# Patient Record
Sex: Female | Born: 1940
Health system: Southern US, Community
[De-identification: ages and names within clinical notes are randomized; demographics above are authoritative.]

## PROBLEM LIST (undated history)

## (undated) DIAGNOSIS — I4719 Other supraventricular tachycardia: Secondary | ICD-10-CM

## (undated) DIAGNOSIS — I471 Supraventricular tachycardia: Secondary | ICD-10-CM

## (undated) DIAGNOSIS — C801 Malignant (primary) neoplasm, unspecified: Secondary | ICD-10-CM

## (undated) DIAGNOSIS — Z8669 Personal history of other diseases of the nervous system and sense organs: Secondary | ICD-10-CM

## (undated) DIAGNOSIS — R55 Syncope and collapse: Secondary | ICD-10-CM

## (undated) DIAGNOSIS — K589 Irritable bowel syndrome without diarrhea: Secondary | ICD-10-CM

## (undated) DIAGNOSIS — Z5189 Encounter for other specified aftercare: Secondary | ICD-10-CM

## (undated) DIAGNOSIS — D126 Benign neoplasm of colon, unspecified: Secondary | ICD-10-CM

## (undated) DIAGNOSIS — K219 Gastro-esophageal reflux disease without esophagitis: Secondary | ICD-10-CM

## (undated) DIAGNOSIS — Z9289 Personal history of other medical treatment: Secondary | ICD-10-CM

## (undated) DIAGNOSIS — F419 Anxiety disorder, unspecified: Secondary | ICD-10-CM

## (undated) DIAGNOSIS — D649 Anemia, unspecified: Secondary | ICD-10-CM

## (undated) DIAGNOSIS — IMO0001 Reserved for inherently not codable concepts without codable children: Secondary | ICD-10-CM

## (undated) DIAGNOSIS — M8580 Other specified disorders of bone density and structure, unspecified site: Secondary | ICD-10-CM

## (undated) DIAGNOSIS — T7840XA Allergy, unspecified, initial encounter: Secondary | ICD-10-CM

## (undated) DIAGNOSIS — H269 Unspecified cataract: Secondary | ICD-10-CM

## (undated) DIAGNOSIS — N189 Chronic kidney disease, unspecified: Secondary | ICD-10-CM

## (undated) HISTORY — PX: APPENDECTOMY: SHX54

## (undated) HISTORY — DX: Unspecified cataract: H26.9

## (undated) HISTORY — DX: Chronic kidney disease, unspecified: N18.9

## (undated) HISTORY — DX: Other supraventricular tachycardia: I47.19

## (undated) HISTORY — DX: Encounter for other specified aftercare: Z51.89

## (undated) HISTORY — PX: ABDOMINAL HYSTERECTOMY: SHX81

## (undated) HISTORY — PX: UPPER GASTROINTESTINAL ENDOSCOPY: SHX188

## (undated) HISTORY — DX: Allergy, unspecified, initial encounter: T78.40XA

## (undated) HISTORY — DX: Malignant (primary) neoplasm, unspecified: C80.1

## (undated) HISTORY — PX: MOHS SURGERY: SUR867

## (undated) HISTORY — DX: Personal history of other diseases of the nervous system and sense organs: Z86.69

## (undated) HISTORY — DX: Syncope and collapse: R55

## (undated) HISTORY — DX: Irritable bowel syndrome, unspecified: K58.9

## (undated) HISTORY — DX: Supraventricular tachycardia: I47.1

## (undated) HISTORY — DX: Benign neoplasm of colon, unspecified: D12.6

## (undated) HISTORY — DX: Anemia, unspecified: D64.9

## (undated) HISTORY — DX: Personal history of other medical treatment: Z92.89

## (undated) HISTORY — PX: CATARACT EXTRACTION W/ INTRAOCULAR LENS  IMPLANT, BILATERAL: SHX1307

## (undated) HISTORY — DX: Anxiety disorder, unspecified: F41.9

## (undated) HISTORY — PX: COLONOSCOPY: SHX174

## (undated) HISTORY — DX: Other specified disorders of bone density and structure, unspecified site: M85.80

## (undated) HISTORY — DX: Gastro-esophageal reflux disease without esophagitis: K21.9

## (undated) HISTORY — DX: Reserved for inherently not codable concepts without codable children: IMO0001

---

## 1969-08-30 HISTORY — PX: PARTIAL NEPHRECTOMY: SHX414

## 1971-08-31 HISTORY — PX: HEMORROIDECTOMY: SUR656

## 1973-08-30 HISTORY — PX: ABDOMINAL HYSTERECTOMY: SHX81

## 1998-03-02 ENCOUNTER — Emergency Department (HOSPITAL_COMMUNITY): Admission: EM | Admit: 1998-03-02 | Discharge: 1998-03-02 | Payer: Self-pay | Admitting: Emergency Medicine

## 1999-05-25 ENCOUNTER — Other Ambulatory Visit: Admission: RE | Admit: 1999-05-25 | Discharge: 1999-05-25 | Payer: Self-pay | Admitting: Gastroenterology

## 1999-06-23 ENCOUNTER — Other Ambulatory Visit: Admission: RE | Admit: 1999-06-23 | Discharge: 1999-06-23 | Payer: Self-pay | Admitting: *Deleted

## 1999-09-24 ENCOUNTER — Other Ambulatory Visit: Admission: RE | Admit: 1999-09-24 | Discharge: 1999-09-24 | Payer: Self-pay | Admitting: Obstetrics and Gynecology

## 1999-11-13 ENCOUNTER — Other Ambulatory Visit: Admission: RE | Admit: 1999-11-13 | Discharge: 1999-11-13 | Payer: Self-pay | Admitting: Obstetrics and Gynecology

## 2000-02-01 ENCOUNTER — Other Ambulatory Visit: Admission: RE | Admit: 2000-02-01 | Discharge: 2000-02-01 | Payer: Self-pay | Admitting: Obstetrics and Gynecology

## 2000-05-05 ENCOUNTER — Other Ambulatory Visit: Admission: RE | Admit: 2000-05-05 | Discharge: 2000-05-05 | Payer: Self-pay | Admitting: Obstetrics and Gynecology

## 2000-09-26 ENCOUNTER — Other Ambulatory Visit: Admission: RE | Admit: 2000-09-26 | Discharge: 2000-09-26 | Payer: Self-pay | Admitting: Obstetrics and Gynecology

## 2001-03-06 ENCOUNTER — Other Ambulatory Visit: Admission: RE | Admit: 2001-03-06 | Discharge: 2001-03-06 | Payer: Self-pay | Admitting: Obstetrics and Gynecology

## 2001-07-04 ENCOUNTER — Other Ambulatory Visit: Admission: RE | Admit: 2001-07-04 | Discharge: 2001-07-04 | Payer: Self-pay | Admitting: Obstetrics and Gynecology

## 2001-11-21 ENCOUNTER — Other Ambulatory Visit: Admission: RE | Admit: 2001-11-21 | Discharge: 2001-11-21 | Payer: Self-pay | Admitting: Obstetrics and Gynecology

## 2002-05-08 ENCOUNTER — Other Ambulatory Visit: Admission: RE | Admit: 2002-05-08 | Discharge: 2002-05-08 | Payer: Self-pay | Admitting: Obstetrics and Gynecology

## 2002-11-23 ENCOUNTER — Other Ambulatory Visit: Admission: RE | Admit: 2002-11-23 | Discharge: 2002-11-23 | Payer: Self-pay | Admitting: Obstetrics and Gynecology

## 2003-06-03 ENCOUNTER — Other Ambulatory Visit: Admission: RE | Admit: 2003-06-03 | Discharge: 2003-06-03 | Payer: Self-pay | Admitting: Obstetrics and Gynecology

## 2003-12-12 ENCOUNTER — Other Ambulatory Visit: Admission: RE | Admit: 2003-12-12 | Discharge: 2003-12-12 | Payer: Self-pay | Admitting: Obstetrics and Gynecology

## 2004-01-06 ENCOUNTER — Encounter: Payer: Self-pay | Admitting: Gastroenterology

## 2004-09-09 ENCOUNTER — Ambulatory Visit: Payer: Self-pay | Admitting: Gastroenterology

## 2005-03-11 ENCOUNTER — Ambulatory Visit: Payer: Self-pay | Admitting: Pulmonary Disease

## 2005-10-14 ENCOUNTER — Ambulatory Visit: Payer: Self-pay | Admitting: Pulmonary Disease

## 2006-06-16 ENCOUNTER — Ambulatory Visit: Payer: Self-pay | Admitting: Gastroenterology

## 2006-06-30 DIAGNOSIS — D126 Benign neoplasm of colon, unspecified: Secondary | ICD-10-CM

## 2006-06-30 HISTORY — DX: Benign neoplasm of colon, unspecified: D12.6

## 2006-07-01 ENCOUNTER — Encounter (INDEPENDENT_AMBULATORY_CARE_PROVIDER_SITE_OTHER): Payer: Self-pay | Admitting: *Deleted

## 2006-07-01 ENCOUNTER — Ambulatory Visit: Payer: Self-pay | Admitting: Gastroenterology

## 2007-09-06 DIAGNOSIS — R0789 Other chest pain: Secondary | ICD-10-CM | POA: Insufficient documentation

## 2007-09-06 DIAGNOSIS — K589 Irritable bowel syndrome without diarrhea: Secondary | ICD-10-CM | POA: Insufficient documentation

## 2007-09-06 DIAGNOSIS — J309 Allergic rhinitis, unspecified: Secondary | ICD-10-CM | POA: Insufficient documentation

## 2007-09-06 DIAGNOSIS — G43909 Migraine, unspecified, not intractable, without status migrainosus: Secondary | ICD-10-CM | POA: Insufficient documentation

## 2007-09-06 DIAGNOSIS — K219 Gastro-esophageal reflux disease without esophagitis: Secondary | ICD-10-CM | POA: Insufficient documentation

## 2007-09-07 ENCOUNTER — Ambulatory Visit: Payer: Self-pay | Admitting: Pulmonary Disease

## 2007-09-07 DIAGNOSIS — F411 Generalized anxiety disorder: Secondary | ICD-10-CM | POA: Insufficient documentation

## 2007-09-07 DIAGNOSIS — R002 Palpitations: Secondary | ICD-10-CM | POA: Insufficient documentation

## 2007-09-16 DIAGNOSIS — H409 Unspecified glaucoma: Secondary | ICD-10-CM | POA: Insufficient documentation

## 2007-09-16 LAB — CONVERTED CEMR LAB
AST: 22 units/L (ref 0–37)
Alkaline Phosphatase: 49 units/L (ref 39–117)
Bilirubin, Direct: 0.2 mg/dL (ref 0.0–0.3)
Calcium: 8.8 mg/dL (ref 8.4–10.5)
Creatinine, Ser: 0.8 mg/dL (ref 0.4–1.2)
Eosinophils Relative: 2.2 % (ref 0.0–5.0)
GFR calc non Af Amer: 76 mL/min
Glucose, Bld: 98 mg/dL (ref 70–99)
HDL: 55.1 mg/dL (ref >39.0)
MCHC: 34.7 g/dL (ref 30.0–36.0)
MCV: 95.7 fL (ref 78.0–100.0)
Monocytes Absolute: 0.3 10*3/uL (ref 0.2–0.7)
Monocytes Relative: 6.5 % (ref 3.0–11.0)
Platelets: 363 10*3/uL (ref 150–400)
RBC: 4.02 M/uL (ref 3.87–5.11)
Sodium: 135 meq/L (ref 135–145)
TSH: 4.08 microintl units/mL (ref 0.35–5.50)
Total Bilirubin: 0.8 mg/dL (ref 0.3–1.2)

## 2008-07-22 ENCOUNTER — Ambulatory Visit: Payer: Self-pay | Admitting: Gastroenterology

## 2008-07-22 DIAGNOSIS — Z86010 Personal history of colonic polyps: Secondary | ICD-10-CM | POA: Insufficient documentation

## 2008-07-22 DIAGNOSIS — R079 Chest pain, unspecified: Secondary | ICD-10-CM | POA: Insufficient documentation

## 2008-07-29 ENCOUNTER — Ambulatory Visit (HOSPITAL_COMMUNITY): Admission: RE | Admit: 2008-07-29 | Discharge: 2008-07-29 | Payer: Self-pay | Admitting: Gastroenterology

## 2008-07-30 DIAGNOSIS — R933 Abnormal findings on diagnostic imaging of other parts of digestive tract: Secondary | ICD-10-CM | POA: Insufficient documentation

## 2008-08-01 ENCOUNTER — Ambulatory Visit: Payer: Self-pay | Admitting: Gastroenterology

## 2008-08-02 LAB — CONVERTED CEMR LAB
BUN: 17 mg/dL (ref 6–23)
Creatinine, Ser: 0.6 mg/dL (ref 0.4–1.2)

## 2008-08-07 ENCOUNTER — Encounter: Admission: RE | Admit: 2008-08-07 | Discharge: 2008-08-07 | Payer: Self-pay | Admitting: Gastroenterology

## 2008-08-08 ENCOUNTER — Telehealth: Payer: Self-pay | Admitting: Gastroenterology

## 2009-04-08 ENCOUNTER — Encounter (INDEPENDENT_AMBULATORY_CARE_PROVIDER_SITE_OTHER): Payer: Self-pay | Admitting: *Deleted

## 2010-03-10 ENCOUNTER — Ambulatory Visit: Payer: Self-pay | Admitting: Pulmonary Disease

## 2010-03-10 ENCOUNTER — Ambulatory Visit: Payer: Self-pay | Admitting: Radiology

## 2010-03-10 ENCOUNTER — Encounter: Payer: Self-pay | Admitting: Adult Health

## 2010-03-10 ENCOUNTER — Ambulatory Visit (HOSPITAL_BASED_OUTPATIENT_CLINIC_OR_DEPARTMENT_OTHER): Admission: RE | Admit: 2010-03-10 | Discharge: 2010-03-10 | Payer: Self-pay | Admitting: Pulmonary Disease

## 2010-03-11 ENCOUNTER — Telehealth: Payer: Self-pay | Admitting: Adult Health

## 2010-03-11 ENCOUNTER — Ambulatory Visit: Payer: Self-pay | Admitting: Adult Health

## 2010-03-13 LAB — CONVERTED CEMR LAB
AST: 19 units/L (ref 0–37)
Alkaline Phosphatase: 55 units/L (ref 39–117)
Basophils Absolute: 0 10*3/uL (ref 0.0–0.1)
CO2: 30 meq/L (ref 19–32)
Calcium: 9.2 mg/dL (ref 8.4–10.5)
Chloride: 108 meq/L (ref 96–112)
Direct LDL: 127 mg/dL
Glucose, Bld: 86 mg/dL (ref 70–99)
Lymphs Abs: 1.3 10*3/uL (ref 0.7–4.0)
Monocytes Absolute: 0.3 10*3/uL (ref 0.1–1.0)
Monocytes Relative: 7 % (ref 3.0–12.0)
Neutro Abs: 2.6 10*3/uL (ref 1.4–7.7)
Neutrophils Relative %: 58.1 % (ref 43.0–77.0)
RBC: 4.06 M/uL (ref 3.87–5.11)
Sodium: 142 meq/L (ref 135–145)
Total Protein: 6.8 g/dL (ref 6.0–8.3)
Triglycerides: 62 mg/dL (ref 0.0–149.0)
WBC: 4.4 10*3/uL — ABNORMAL LOW (ref 4.5–10.5)

## 2010-05-13 ENCOUNTER — Encounter: Payer: Self-pay | Admitting: Cardiology

## 2010-05-13 ENCOUNTER — Ambulatory Visit: Payer: Self-pay | Admitting: Cardiology

## 2010-05-21 ENCOUNTER — Encounter: Payer: Self-pay | Admitting: Cardiology

## 2010-05-21 ENCOUNTER — Ambulatory Visit: Payer: Self-pay

## 2010-05-21 ENCOUNTER — Ambulatory Visit (HOSPITAL_COMMUNITY): Admission: RE | Admit: 2010-05-21 | Discharge: 2010-05-21 | Payer: Self-pay | Admitting: Cardiology

## 2010-05-21 ENCOUNTER — Ambulatory Visit: Payer: Self-pay | Admitting: Internal Medicine

## 2010-05-22 ENCOUNTER — Telehealth: Payer: Self-pay | Admitting: Cardiology

## 2010-05-28 ENCOUNTER — Ambulatory Visit: Payer: Self-pay | Admitting: Cardiology

## 2010-06-16 ENCOUNTER — Telehealth: Payer: Self-pay | Admitting: Cardiology

## 2010-09-30 NOTE — Assessment & Plan Note (Signed)
Summary: irreg. heartbeat/in [facility] office/apc   Visit Type:  NP acute visit Primary Provider/Referring Provider:  Alroy Dust, MD  CC:  Pt c/o irregular heartbeats x 1 week worse yesterday, "fluttering", "run down", no energy x 3 to 4 months, recurrent head colds with cough. Pt also c/o abdominal cramping from IBS , and Hypertension Management.  History of Present Illness: 70 with known hx of GERD and IBS   1/09 -follow up visit... she was last seen 2/07 by the nurse practitioner, and 7/06 by myself... she repots not feeling well for the last 2 weeks... she notes occas palpit despite totally stopping caffeine, and has some discomfort in her legs... she notes that she's been under alot of stress "my husb is a heart pt, and my sons have HBP & Chol (one son is a physician- [employer] in [place])   March 10, 2010--Presents for work in visit. Complains of Pt c/o irregular heartbeats x 1 week worse yesterday, "fluttering", "run down", no energy x 3 to 4 months, recurrent head colds with cough. Pt also c/o abdominal cramping from IBS.  She has not been seen in >2 yrs. Says she has seen GYN for routine  female care. Over last year under alot of stress w/ family issues. Has several issues today.  She has noticed her palpitations have returned over last week. She has intermittent episodes of palpitations, irregular heatbeats , along w/ this has occasional left arm pain, and dyspnea. No associated w/ acitivty or execise. She does not exercise on regular basis. Last labs in 09 showed LDL  ~179, LDL  ~110 , HDL  ~55. No faimily hx and she is former smoker. She has felt run down lately w/ low energy, feels like she catches cold easily. Has intermittent stuffy nose, drainage. and drippy nose. She also has been having flare of IBS symptoms w/ bloating , gas, low abd cramping after eating. Has occasion loose stools. Denies chest pain, ohopnea, hemoptysis, fever, n/v/d, edema, headache, bloody stools, hair loss,  cold./heat intolerances, urinary symptoms  Hypertension History:      Positive major cardiovascular risk factors include female age 72 years old or older. years old or older.  Negative major cardiovascular risk factors include non-tobacco-user status.     Preventive Screening-Counseling & Management  Alcohol-Tobacco     Smoking Status: quit     Packs/Day: 1.0     Year Started: 1960     Year Quit: 1970  Current Medications (verified): 1)  Lumigan 0.03 %  Soln (Bimatoprost) .... At Bedtime 2)  Azopt 1 %  Susp (Brinzolamide) .... Use 1 Drops Three Times A Day 3)  Imitrex 100 Mg  Tabs (Sumatriptan Succinate) .... Take 1/2 To 1 Tablet By Mouth As Needed For Headache 4)  Zoloft 50 Mg Tabs (Sertraline Hcl) .... Take 1 Tablet By Mouth Once A Day (Up To 75mg ) 5)  Vitamin D3 2000 Unit Caps (Cholecalciferol) .... Take 1 Tablet By Mouth Once A Day 6)  Calcium Chew .... Take 1 Tablets By Mouth Once Daily As Needed  Allergies (verified): No Known Drug Allergies  Past History:  Past Surgical History: Last updated: 07/22/2008 Kidney Surgery - 1971 w/ removal of a diverticulum Hysterectomy Cataract extraction-bilateral Hemorrhoidectomy Appendectomy D & C X 2  Family History: Last updated: 03/10/2010 Family History of Colon Cancer: Maternal Aunt Family History of Larynegeal Cancer: Father Family History of Celiac Disease: Son, Mother ? Family History of Diabetes: Son neg for heart dz mom living at age 72 (March 10, 2010)  Social History: Last updated: 07/22/2008 Alcohol Use - yes-once per week Illicit Drug Use - no Patient is a former smoker. -stopped over 45 years ago Occupation: Retired Engineer, civil (consulting) Patient gets regular exercise.  Risk Factors: Smoking Status: quit (03/10/2010) Packs/Day: 1.0 (03/10/2010)  Past Medical History: GLAUCOMA (ICD-365.9) - followed at [facility] by DrWhitaker, on gtts... ALLERGIC RHINITIS (ICD-477.9) - has seen DrByers for ENT w/ LER... PALPITATIONS (ICD-785.1) - normal 2DEcho  1993... negative Stress Cardiolite 2003 without ischemia and EF=75%... CHEST PAIN, ATYPICAL (ICD-786.59) GERD (ICD-530.81) - GI eval DrStark w/ GERD, LER, & Globus phenom... controlled w/ ACIPHEX which she prefers to the other PPIs... (Nexium & Omep without benefit)... IRRITABLE BOWEL SYNDROME (ICD-564.1) - last colonoscopy 11/07 by DrStark showed 5mm polyp (tubular adenoma) f/u planned 5 yrs. Hx of MIGRAINE HEADACHE (ICD-346.90) - Hx myofascial pain as well w/ prev accupuncture therapy in 2003 by DrBloom... prev headache eval in the [place] [facility]... ANXIETY (ICD-300.00)  *** Health Maintenance - GYN= DrDickstein for PAP, Mammogram, BMD -    COLONIC POLYPS, HX OF (ICD-V12.72) adenomatous 06/2006    Family History: Family History of Colon Cancer: Maternal Aunt Family History of Larynegeal Cancer: Father Family History of Celiac Disease: Son, Mother ? Family History of Diabetes: Son neg for heart dz mom living at age 61 (March 10, 2010)  Social History: Packs/Day:  1.0  Review of Systems      See HPI  Vital Signs:  Patient profile:   70 year old female Height:      66.25 inches Weight:      129 pounds BMI:     20.74 O2 Sat:      97 % on Room air Temp:     98.4 degrees F oral Pulse rate:   78 / minute BP sitting:   120 / 82  (left arm) Cuff size:   regular  Vitals Entered By: Zackery Barefoot CMA (March 10, 2010 2:42 PM)  O2 Flow:  Room air CC: Pt c/o irregular heartbeats x 1 week worse yesterday, "fluttering", "run down", no energy x 3 to 4 months, recurrent head colds with cough. Pt also c/o abdominal cramping from IBS , Hypertension Management Comments Medications reviewed with patient Verified contact number and pharmacy with patient Zackery Barefoot CMA  March 10, 2010 2:53 PM    Physical Exam  Additional Exam:  WD, WN, 70 y/o WF in NAD... GENERAL:  Alert & oriented; pleasant & cooperative. HEENT:  [place]/AT, EOM-wnl, PERRLA, Fundi-benign, EACs-clear, TMs-wnl,  NOSE-clear, THROAT-clear & wnl. NECK:  Supple w/ full ROM; no JVD; normal carotid impulses w/o bruits; no thyromegaly or nodules palpated; no lymphadenopathy. CHEST:  Clear to P & A; without wheezes/ rales/ or rhonchi. HEART:  Regular Rhythm; without murmurs/ rubs/ or gallops. ABDOMEN:  Soft & nontender; normal bowel sounds; no organomegaly or masses detected. EXT: without deformities or arthritic changes; no varicose veins/ venous insuffic/ or edema, pulses intact... NEURO:  CN's intact; motor testing normal; sensory testing normal; gait normal & balance OK. DERM:  No lesions noted; no rash etc...  EKG: NSR w/ HR at 73      Impression & Recommendations:  Problem # 1:  CHEST PAIN (ICD-786.50) Atypical chest pain and palpitations. nml echo 1993, neg cardiolite in 2003 Low risk factors. however w/ age and recurrent symptoms of chest pain will refer to cards to evaluate.  I suspect this is stress related.  willl check labs  also avoid decongestants, caffeine, etc.   Problem # 2:  IRRITABLE  BOWEL SYNDROME (ICD-564.1)  Mild flare  rec on stress reducers, exercise, dietary changes gas x w/ meals.  prilosec as needed for heartburn.  increase fluids and fiber in diet.   Orders: Est. Patient Level IV (27253)  Problem # 3:  ALLERGIC RHINITIS (ICD-477.9)  Mild flare  Rec: saline nasal rinse and allegra as needed   Orders: Est. Patient Level IV (66440)  Medications Added to Medication List This Visit: 1)  Azopt 1 % Susp (Brinzolamide) .... Use 1 drops three times a day 2)  Imitrex 100 Mg Tabs (Sumatriptan succinate) .... Take 1/2 to 1 tablet by mouth as needed for headache 3)  Zoloft 50 Mg Tabs (Sertraline hcl) .... Take 1 tablet by mouth once a day (up to 75mg ) 4)  Vitamin D3 2000 Unit Caps (Cholecalciferol) .... Take 1 tablet by mouth once a day 5)  Calcium Chew  .... Take 1 tablets by mouth once daily as needed  Complete Medication List: 1)  Lumigan 0.03 % Soln (Bimatoprost)  .... At bedtime 2)  Azopt 1 % Susp (Brinzolamide) .... Use 1 drops three times a day 3)  Imitrex 100 Mg Tabs (Sumatriptan succinate) .... Take 1/2 to 1 tablet by mouth as needed for headache 4)  Zoloft 50 Mg Tabs (Sertraline hcl) .... Take 1 tablet by mouth once a day (up to 75mg ) 5)  Vitamin D3 2000 Unit Caps (Cholecalciferol) .... Take 1 tablet by mouth once a day 6)  Calcium Chew  .... Take 1 tablets by mouth once daily as needed  Other Orders: T-2 View CXR (71020TC) Cardiology Referral (Cardiology)  Hypertension Assessment/Plan:      The patient's hypertensive risk group is category B: At least one risk factor (excluding diabetes) with no target organ damage.  Her calculated 10 year risk of coronary heart disease is 8 %.  Today's blood pressure is 120/82.    Patient Instructions: 1)  Saline nasal rinses as needed  2)  Allegra 180mg  once daily as needed drainage, stuffiness.  3)  Mucinex DM two times a day as needed cough/congestion 4)  Gasx w/ meals.  5)  Increase fluids and fiber in diet.  6)  Probiotics and yogurt may help w/ colon health.  7)  Avoid decongestants, keep caffeine low in diet 8)  STress reducers.  9)  follow up for fasting labs at [facility].  10)  I will call with lab and xray results.  11)  follow up Dr. Kriste Basque in 6 months and as needed  12)  follow up Cardiology as scheduled.  13)  Please contact office for sooner follow up if symptoms do not improve or worsen If chest pain reoccurs call back sooner or go to ER.    Immunization History:  Influenza Immunization History:    Influenza:  historical (05/05/2009)

## 2010-09-30 NOTE — Procedures (Signed)
Summary: Summary Report  Summary Report   Imported By: Erle Crocker 05/28/2010 14:32:41  _____________________________________________________________________  External Attachment:    Type:   Image     Comment:   External Document

## 2010-09-30 NOTE — Progress Notes (Signed)
Summary: returning call  Phone Note Call from Patient Call back at Home Phone [phone]   Caller: Patient Call For: Cathy Ropp Summary of Call: Returning [place] [facility] call. Initial call taken by: Darletta Moll,  March 11, 2010 3:12 PM  Follow-up for Phone Call        called and spoke with pt and she is aware per TP---cxr shows no sign of infection.  pt voiced her understanding of this.  will call for further problems Randell Loop CMA  March 11, 2010 3:24 PM

## 2010-09-30 NOTE — Assessment & Plan Note (Signed)
Summary: np6/chest pain.palps/age/risk factors   Primary Provider:  Alroy Dust, MD  CC:  new patient/  Chest pain and palpitations "fluttering" per patient.  The heat seems to be bothersome and causes dizziness sometimes when she is overheated.  History of Present Illness: 70 yo with history of GERD and IBS presents for evaluation of palpitations.  Patient has had "fluttering" in her chest as well as feeling like her heart pauses for the last several years.  She occasionally feels lightheaded when she gets the palpitations.  She does not feel like her heart races.  She tends to be active and helps take care of her grandchildren.  She golfs and walks on a treadmill.  She is mildly short of breath when walking uphill but otherwise has good exercise tolerance. No exertional chest pain. No syncope.  She drinks coffee about once daily and occasional tea.   ECG: NSR, left atrial enlargement, slight inferior Qs  Labs (7/11): K 4.6, creatinine 0.6, TSH normal, HDL 60, LDL 127  Current Medications (verified): 1)  Lumigan 0.03 %  Soln (Bimatoprost) .... At Bedtime 2)  Azopt 1 %  Susp (Brinzolamide) .... Use 1 Drops Three Times A Day 3)  Imitrex 100 Mg  Tabs (Sumatriptan Succinate) .... Take 1/2 To 1 Tablet By Mouth As Needed For Headache 4)  Zoloft 50 Mg Tabs (Sertraline Hcl) .... Take 1 Tablet By Mouth Once A Day (Up To 75mg ) 5)  Vitamin D3 2000 Unit Caps (Cholecalciferol) .... Take 1 Tablet By Mouth Once A Day 6)  Calcium Chew .... Take 1 Tablets By Mouth Once Daily As Needed  Allergies: No Known Drug Allergies  Past History:  Past Medical History: 1. GLAUCOMA (ICD-365.9) - followed at [facility] by Dr Harlon Flor, on gtts... 2. ALLERGIC RHINITIS (ICD-477.9) - has seen DrByers for ENT w/ LER... 3. PALPITATIONS (ICD-785.1) - normal 2DEcho 1993...  4. Atypical chest pain: negative Stress Cardiolite 2003 without ischemia and EF=75% 5. GERD (ICD-530.81) - GI eval DrStark w/ GERD, LER, & Globus phenom...  controlled w/ ACIPHEX which she prefers to the other PPIs... (Nexium & Omep without benefit)... 6. IRRITABLE BOWEL SYNDROME (ICD-564.1) - last colonoscopy 11/07 by DrStark showed 5mm polyp (tubular adenoma) f/u planned 5 yrs. 7. Hx of MIGRAINE HEADACHE (ICD-346.90) - Hx myofascial pain as well w/ prev accupuncture therapy in 2003 by Dr Wilma Flavin... prev headache eval in the [place] clinic... 8. ANXIETY (ICD-300.00)  *** Health Maintenance - GYN= DrDickstein for PAP, Mammogram, BMD -    Family History: Family History of Colon Cancer: Maternal Aunt Family History of Larynegeal Cancer: Father Family History of Celiac Disease: Son, Mother ? Family History of Diabetes: Son neg for heart dz mom living at age 34 (March 10, 2010) Brothers healthy in general  Social History: Alcohol Use - yes-once per week Illicit Drug Use - no Patient is a former smoker. -stopped over 45 years ago Occupation: Retired Engineer, civil (consulting) Patient gets regular exercise. Married, lives in  [place]  Review of Systems       All systems reviewed and negative except as per HPI.   Vital Signs:  Patient profile:   70 year old female Height:      66.25 inches Weight:      129 pounds BMI:     20.74 Pulse rate:   76 / minute Pulse rhythm:   regular BP sitting:   114 / 66  (left arm) Cuff size:   regular  Vitals Entered By: Judithe Modest CMA (May 13, 2010  9:59 AM)  Physical Exam  General:  Well developed, well nourished, in no acute distress. Head:  normocephalic and atraumatic Nose:  no deformity, discharge, inflammation, or lesions Mouth:  Teeth, gums and palate normal. Oral mucosa normal. Neck:  Neck supple, no JVD. No masses, thyromegaly or abnormal cervical nodes. Lungs:  Clear bilaterally to auscultation and percussion. Heart:  Non-displaced PMI, chest non-tender; regular rate and rhythm, S1, S2 without murmurs, rubs or gallops. Carotid upstroke normal, no bruit. Pedals normal pulses. No edema, no  varicosities. Abdomen:  Bowel sounds positive; abdomen soft and non-tender without masses, organomegaly, or hernias noted. No hepatosplenomegaly. Msk:  Back normal, normal gait. Muscle strength and tone normal. Extremities:  No clubbing or cyanosis. Neurologic:  Alert and oriented x 3. Skin:  Intact without lesions or rashes. Psych:  Normal affect.   Impression & Recommendations:  Problem # 1:  PALPITATIONS (ICD-785.1) Patient feels like her heart pauses and flutters.  This occurs periodically and has done so for a couple of years.  I recommended that she try to cut back on caffeine as I suspect premature beats.  I will get a 48 hour holter monitor and an echo to make sure her heart is structurally normal.    Other Orders: Holter Monitor (Holter Monitor) Echocardiogram (Echo)  Patient Instructions: 1)  Your physician has recommended that you wear a holter monitor.  Holter monitors are medical devices that record the heart's electrical activity. Doctors most often use these monitors to diagnose arrhythmias. Arrhythmias are problems with the speed or rhythm of the heartbeat. The monitor is a small, portable device. You can wear one while you do your normal daily activities. This is usually used to diagnose what is causing palpitations/syncope (passing out). 48 hour 2)  Your physician has requested that you have an echocardiogram.  Echocardiography is a painless test that uses sound waves to create images of your heart. It provides your doctor with information about the size and shape of your heart and how well your heart's chambers and valves are working.  This procedure takes approximately one hour. There are no restrictions for this procedure. 3)  Your physician recommends that you schedule a follow-up appointment with Dr Shirlee Latch in 2 weeks after the testing has been done.

## 2010-09-30 NOTE — Progress Notes (Signed)
Summary: rtn your call-monitor results  Phone Note Call from Patient Call back at Home Phone [phone] Call back at [phone]   Caller: Patient Reason for Call: Talk to Nurse, Talk to Doctor Summary of Call: rtn call to you Initial call taken by: Omer Jack,  May 22, 2010 4:05 PM  Follow-up for Phone Call        I talked with pt by telephone about her monitor results--Dr Shirlee Latch reviewed monitor done 05/13/10--PACs and short run (5 beats) of SVT/nothing worrisome--if symptoms are very bothersome,can try metoprolol 12.5mg  two times a day . Otherwise, cut back on caffeine---pt has eliminated caffeine, she did not want metoprolol at present time--she has an appt with Dr Shirlee Latch 05/28/10 and will discuss with him at time of OV

## 2010-09-30 NOTE — Procedures (Signed)
Summary: SUMMARY REPORT  SUMMARY REPORT   Imported By: Mirna Mires 05/25/2010 08:46:32  _____________________________________________________________________  External Attachment:    Type:   Image     Comment:   External Document

## 2010-09-30 NOTE — Progress Notes (Signed)
Summary: PT CHANGED PHARMACY  RX faxed for metoprolol tot [facility]  Phone Note Refill Request Message from:  Patient on June 16, 2010 12:41 PM  Refills Requested: Medication #1:  METOPROLOL TARTRATE 25 MG TABS one half twice a day. PT STATES SHE HAS CHANGE TO [facility]. [facility] WILL SEND A FAX. [facility].    Method Requested: Mail to Pharmacy Initial call taken by: Roe Coombs,  June 16, 2010 12:41 PM Caller: Patient Reason for Call: Talk to Nurse Summary of Call: PT IS CHANGING PHARMACY TO [facility]. PT STATES THE OFFICE WILL GET FAXES   Follow-up for Phone Call        lm that refill requested was sent into [facility] for #90 with 3 refills and to call back if questions Follow-up by: Charolotte Capuchin, RN,  June 16, 2010 2:59 PM     Prescriptions: METOPROLOL TARTRATE 25 MG TABS (METOPROLOL TARTRATE) one half twice a day  #90 x 3   Entered by:   Charolotte Capuchin, RN   Authorized by:   Marca Ancona, MD   Signed by:   Charolotte Capuchin, RN on 06/16/2010   Method used:   Faxed to ...       [facility] (mail-order)             , [place]         Ph: [phone]       Fax: [phone]   RxID:   [id number]

## 2010-09-30 NOTE — Assessment & Plan Note (Signed)
Summary: 2wk f/u @ 3:15   Primary Provider:  Alroy Dust, MD  CC:  follow up.  History of Present Illness: 70 yo with history of GERD and IBS presented initially for evaluation of palpitations.  Patient has had "fluttering" in her chest as well as feeling like her heart pauses for the last several years.  She occasionally feels lightheaded when she gets the palpitations.  She does not feel like her heart races.  She tends to be active and helps take care of her grandchildren.  She golfs and walks on a treadmill.  She is mildly short of breath when walking uphill but otherwise has good exercise tolerance. No exertional chest pain. No syncope.  Since I last saw her, she had an echo done, showing normal LV systolic function and normal valves.  A holter monitor showed PACs and a short run (5 beats) of probable atrial tachycardia.  There were no worrisome arrhythmias.  She has cut out caffeine.  She continues to have bothersome palpitations and thinks it may be related to increased stress.    Labs (7/11): K 4.6, creatinine 0.6, TSH normal, HDL 60, LDL 127  Current Medications (verified): 1)  Lumigan 0.03 %  Soln (Bimatoprost) .... At Bedtime 2)  Azopt 1 %  Susp (Brinzolamide) .... Use 1 Drops Three Times A Day 3)  Imitrex 100 Mg  Tabs (Sumatriptan Succinate) .... Take 1/2 To 1 Tablet By Mouth As Needed For Headache 4)  Zoloft 50 Mg Tabs (Sertraline Hcl) .... Take 1 Tablet By Mouth Once A Day (Up To 75mg ) 5)  Vitamin D3 2000 Unit Caps (Cholecalciferol) .... Take 1 Tablet By Mouth Once A Day 6)  Calcium Chew .... Take 1 Tablets By Mouth Once Daily As Needed  Allergies: No Known Drug Allergies  Past History:  Past Medical History: 1. GLAUCOMA (ICD-365.9) - followed at [facility] by Dr Harlon Flor, on gtts... 2. ALLERGIC RHINITIS (ICD-477.9) - has seen DrByers for ENT w/ LER... 3. PALPITATIONS (ICD-785.1) - normal 2DEcho 1993.  Echo (9/11): EF 60%, mild diastolic dysfunction, no significant valvular  dysfunction, normal RV, PA systolic pressure 31 mmHg.  Holter monitor (9/11) with PACs and a short run (5 beats) of atrial tachycardia.  No worrisome arrhythmias.   4. Atypical chest pain: negative Stress Cardiolite 2003 without ischemia and EF=75% 5. GERD (ICD-530.81) - GI eval DrStark w/ GERD, LER, & Globus phenom... controlled w/ ACIPHEX which she prefers to the other PPIs... (Nexium & Omep without benefit)... 6. IRRITABLE BOWEL SYNDROME (ICD-564.1) - last colonoscopy 11/07 by DrStark showed 5mm polyp (tubular adenoma) f/u planned 5 yrs. 7. Hx of MIGRAINE HEADACHE (ICD-346.90) - Hx myofascial pain as well w/ prev accupuncture therapy in 2003 by Dr Wilma Flavin... prev headache eval in the [place] clinic... 8. ANXIETY (ICD-300.00)  *** Health Maintenance - GYN= DrDickstein for PAP, Mammogram, BMD -    Family History: Reviewed history from 05/13/2010 and no changes required. Family History of Colon Cancer: Maternal Aunt Family History of Larynegeal Cancer: Father Family History of Celiac Disease: Son, Mother ? Family History of Diabetes: Son neg for heart dz mom living at age 74 (March 10, 2010) Brothers healthy in general  Social History: Reviewed history from 05/13/2010 and no changes required. Alcohol Use - yes-once per week Illicit Drug Use - no Patient is a former smoker. -stopped over 45 years ago Occupation: Retired Engineer, civil (consulting) Patient gets regular exercise. Married, lives in  [place]  Vital Signs:  Patient profile:   70 year old female  Height:      66 inches Weight:      129 pounds BMI:     20.90 Pulse rate:   77 / minute Resp:     14 per minute BP sitting:   119 / 69  (left arm)  Vitals Entered By: Kem Parkinson (May 28, 2010 3:34 PM)  Physical Exam  General:  Well developed, well nourished, in no acute distress. Neck:  Neck supple, no JVD. No masses, thyromegaly or abnormal cervical nodes. Lungs:  Clear bilaterally to auscultation and percussion. Heart:   Non-displaced PMI, chest non-tender; regular rate and rhythm, S1, S2 without murmurs, rubs or gallops. Carotid upstroke normal, no bruit. Pedals normal pulses. No edema, no varicosities. Abdomen:  Bowel sounds positive; abdomen soft and non-tender without masses, organomegaly, or hernias noted. No hepatosplenomegaly. Extremities:  No clubbing or cyanosis. Neurologic:  Alert and oriented x 3. Psych:  Normal affect.   Impression & Recommendations:  Problem # 1:  PALPITATIONS (ICD-785.1) I suspect symptoms are caused by PACs.  She had 1 run of 5 beats of a probable atrial tachycardia.  There were no worrisome arrhythmias.  TSH was normal.  She has cut out caffeine and is still symptomatic.  I gave her a prescription for metoprolol 12.5 mg two times a day.  She can try this to see if it helps her to feel better as it may suppress the PACs.  If it does not help, would not take it.   Patient Instructions: 1)  Your physician has recommended you make the following change in your medication:  2)  Take Metoprolol 12.5mg  twice a day--this will be one-half of a 25mg  tablet twice a day. 3)  Your physician recommends that you schedule a follow-up appointment as needed with Dr Shirlee Latch.  Prescriptions: METOPROLOL TARTRATE 25 MG TABS (METOPROLOL TARTRATE) one half twice a day  #30 x 11   Entered by:   Katina Dung, RN, BSN   Authorized by:   Marca Ancona, MD   Signed by:   Katina Dung, RN, BSN on 05/28/2010   Method used:   [facility]        [phone][facility] (retail)       [address] [place], [place]  [address]       Ph: [phone] or [phone]       Fax: [phone]   RxID:   [id number]

## 2011-04-21 ENCOUNTER — Other Ambulatory Visit: Payer: Self-pay

## 2011-04-21 ENCOUNTER — Other Ambulatory Visit: Payer: Self-pay | Admitting: *Deleted

## 2011-04-21 ENCOUNTER — Telehealth: Payer: Self-pay | Admitting: Cardiology

## 2011-04-21 MED ORDER — METOPROLOL SUCCINATE ER 25 MG PO TB24: ORAL_TABLET | ORAL | Status: DC

## 2011-04-21 NOTE — Telephone Encounter (Signed)
Per pt call, pt needs refill of metoprolol 25mg  called into [facility]. Please return pt call to advise/discuss if necessary.

## 2011-04-26 ENCOUNTER — Other Ambulatory Visit: Payer: Self-pay | Admitting: Cardiology

## 2011-04-26 NOTE — Telephone Encounter (Signed)
Ref # [id number]

## 2011-04-28 NOTE — Telephone Encounter (Signed)
I talked with Onalee Hua at [facility] [phone].The prescription is for metoprolol tartrate 25mg  one-half bid. I gave [facility] a refill for pt for 90 days and 0 refills. Pt had seen Dr Shirlee Latch 05/29/10 and he did not need to see her in follow-up. Onalee Hua will make a note.

## 2011-04-28 NOTE — Telephone Encounter (Signed)
[facility] calling back to speak with someone. Pt says he takes metoprolol titrate the RX was written for metoprolol succinate. Please return call to discuss.  ZOX#[id number]

## 2011-06-25 ENCOUNTER — Encounter: Payer: Self-pay | Admitting: Gastroenterology

## 2011-07-12 ENCOUNTER — Ambulatory Visit (AMBULATORY_SURGERY_CENTER): Payer: Medicare Other | Admitting: *Deleted

## 2011-07-12 VITALS — Ht 66.0 in | Wt 129.0 lb

## 2011-07-12 DIAGNOSIS — Z1211 Encounter for screening for malignant neoplasm of colon: Secondary | ICD-10-CM

## 2011-07-12 MED ORDER — PEG-KCL-NACL-NASULF-NA ASC-C 100 G PO SOLR: ORAL | Status: DC

## 2011-07-26 ENCOUNTER — Ambulatory Visit (AMBULATORY_SURGERY_CENTER): Payer: Medicare Other | Admitting: Gastroenterology

## 2011-07-26 ENCOUNTER — Encounter: Payer: Self-pay | Admitting: Gastroenterology

## 2011-07-26 VITALS — BP 112/75 | HR 73 | Temp 97.4°F | Resp 20 | Ht 66.0 in | Wt 129.0 lb

## 2011-07-26 DIAGNOSIS — Z1211 Encounter for screening for malignant neoplasm of colon: Secondary | ICD-10-CM

## 2011-07-26 DIAGNOSIS — D126 Benign neoplasm of colon, unspecified: Secondary | ICD-10-CM

## 2011-07-26 DIAGNOSIS — Z86010 Personal history of colonic polyps: Secondary | ICD-10-CM

## 2011-07-26 MED ORDER — SODIUM CHLORIDE 0.9 % IV SOLN: 500.0000 mL | INTRAVENOUS | Status: DC

## 2011-07-26 NOTE — Progress Notes (Signed)
Patient did not experience any of the following events: a burn prior to discharge; a fall within the facility; wrong site/side/patient/procedure/implant event; or a hospital transfer or hospital admission upon discharge from the facility. (G8907) Patient did not have preoperative order for IV antibiotic SSI prophylaxis. (G8918)  

## 2011-07-27 ENCOUNTER — Telehealth: Payer: Self-pay | Admitting: *Deleted

## 2011-07-27 NOTE — Telephone Encounter (Signed)

## 2011-08-01 ENCOUNTER — Encounter: Payer: Self-pay | Admitting: Gastroenterology

## 2011-09-07 ENCOUNTER — Ambulatory Visit (INDEPENDENT_AMBULATORY_CARE_PROVIDER_SITE_OTHER): Payer: Medicare Other

## 2011-09-07 DIAGNOSIS — S8000XA Contusion of unspecified knee, initial encounter: Secondary | ICD-10-CM

## 2011-09-07 DIAGNOSIS — Z23 Encounter for immunization: Secondary | ICD-10-CM

## 2011-09-16 ENCOUNTER — Other Ambulatory Visit: Payer: Self-pay | Admitting: Cardiology

## 2011-09-16 MED ORDER — METOPROLOL SUCCINATE ER 25 MG PO TB24: ORAL_TABLET | ORAL | Status: DC

## 2011-10-04 ENCOUNTER — Telehealth: Payer: Self-pay | Admitting: Cardiology

## 2011-10-04 MED ORDER — METOPROLOL TARTRATE 25 MG PO TABS: ORAL_TABLET | ORAL | Status: DC

## 2011-10-04 NOTE — Telephone Encounter (Signed)
I talked with pt. Last OV with Dr Shirlee Latch was 05/29/10. Dr Shirlee Latch was going to see pt PRN. Pt was taking metoprolol tartrate 25mg  one-half bid. (12.5mg  bid) I will send in a prescription for pt for metoprolol tartrate 12.5mg  bid 90 day supply to [facility]. Pt to follow-up with Dr Kriste Basque, her PCP, for more refills.

## 2011-10-04 NOTE — Telephone Encounter (Signed)
Pt has questions regarding her metroprolol tartrate and succinate she needs to know which one should she be taking

## 2011-11-29 ENCOUNTER — Encounter (INDEPENDENT_AMBULATORY_CARE_PROVIDER_SITE_OTHER): Payer: Self-pay | Admitting: Surgery

## 2011-12-16 ENCOUNTER — Ambulatory Visit (INDEPENDENT_AMBULATORY_CARE_PROVIDER_SITE_OTHER): Payer: Medicare Other | Admitting: Surgery

## 2011-12-16 ENCOUNTER — Encounter (INDEPENDENT_AMBULATORY_CARE_PROVIDER_SITE_OTHER): Payer: Self-pay | Admitting: Surgery

## 2011-12-16 VITALS — BP 122/78 | HR 82 | Temp 99.7°F | Resp 16 | Ht 66.5 in | Wt 131.8 lb

## 2011-12-16 DIAGNOSIS — D1779 Benign lipomatous neoplasm of other sites: Secondary | ICD-10-CM

## 2011-12-16 DIAGNOSIS — D171 Benign lipomatous neoplasm of skin and subcutaneous tissue of trunk: Secondary | ICD-10-CM | POA: Insufficient documentation

## 2011-12-16 NOTE — Progress Notes (Signed)
Patient ID: Destiny Sanchez, female   DOB: Jan 06, 1941, 71 y.o.   MRN: [id number]  Chief Complaint  Patient presents with  . Lipoma    Evaluate lipoma on back    HPI Destiny Sanchez is a 71 y.o. female.  Referred by Dr. Juliene Pina for evaluation of a mass on her back. HPI 71 yo female presents after her husband noted a mass on her back between her scapulae several weeks ago.  She states that it is mildly tender when palpated, but it doesn't bother her when she lays down on her back.  She comes in for surgical evaluation.  Past Medical History  Diagnosis Date  . Allergy   . Blood transfusion 1967 &1971  . Cancer     basal cell on nose  . GERD (gastroesophageal reflux disease)   . Glaucoma   . SVT (supraventricular tachycardia)   . Osteopenia     Past Surgical History  Procedure Date  . Cataract extraction w/ intraocular lens  implant, bilateral   . Partial nephrectomy 1971    diverticulum on left  . Abdominal hysterectomy 1975  . Mohs surgery   . Hemorroidectomy 1973    Family History  Problem Relation Age of Onset  . Colon cancer Maternal Aunt     Social History History  Substance Use Topics  . Smoking status: Former Games developer  . Smokeless tobacco: Never Used  . Alcohol Use: 1.8 oz/week    3 Glasses of wine per week    No Known Allergies  Current Outpatient Prescriptions  Medication Sig Dispense Refill  . bimatoprost (LUMIGAN) 0.01 % SOLN Place 1 drop into both eyes at bedtime.        . brinzolamide (AZOPT) 1 % ophthalmic suspension Place 1 drop into both eyes 3 (three) times daily.        . Calcium Carbonate-Vitamin D (CALTRATE 600+D PO) Take 1,200 mg by mouth daily.        . metoprolol succinate (TOPROL XL) 25 MG 24 hr tablet Take 1/2 tab twice a day  90 tablet  1  . metoprolol tartrate (LOPRESSOR) 25 MG tablet 1/2 bid  90 tablet  0  . multivitamin (THERAGRAN) per tablet Take 1 tablet by mouth daily.        . sertraline (ZOLOFT) 50 MG tablet Take 50 mg by  mouth daily.        . SUMAtriptan (IMITREX) 100 MG tablet Take 100 mg by mouth every 2 (two) hours as needed.        . Cholecalciferol (VITAMIN D) 2000 UNITS tablet Take 2,000 Units by mouth daily.          Review of Systems Review of Systems  Constitutional: Negative for fever, chills and unexpected weight change.  HENT: Negative for hearing loss, congestion, sore throat, trouble swallowing and voice change.   Eyes: Negative for visual disturbance.  Respiratory: Negative for cough and wheezing.   Cardiovascular: Negative for chest pain, palpitations and leg swelling.  Gastrointestinal: Negative for nausea, vomiting, abdominal pain, diarrhea, constipation, blood in stool, abdominal distention and anal bleeding.  Genitourinary: Negative for hematuria, vaginal bleeding and difficulty urinating.  Musculoskeletal: Negative for arthralgias.  Skin: Negative for rash and wound.  Neurological: Negative for seizures, syncope and headaches.  Hematological: Negative for adenopathy. Does not bruise/bleed easily.  Psychiatric/Behavioral: Negative for confusion.    Blood pressure 122/78, pulse 82, temperature 99.7 F (37.6 C), temperature source Temporal, resp. rate 16, height 5' 6.5" (1.689 m), weight  131 lb 12.8 oz (59.784 kg).  Physical Exam Physical Exam WDWN in NAD Back - just to the right of midline between her scapulae, there is a palpable 4 cm mass.  This is well-demarcated with no sign of infection or inflammation.  No skin changes.  This is a relatively flat mass with no significant tenderness to palpation.  Data Reviewed None  Assessment    Subcutaneous lipoma of the back - 4 cm    Plan    Recommend excision under anesthesia.  The surgical procedure has been discussed with the patient.  Potential risks, benefits, alternative treatments, and expected outcomes have been explained.  All of the patient's questions at this time have been answered.  The likelihood of reaching the  patient's treatment goal is good.  The patient understand the proposed surgical procedure and wishes to proceed. The patient will call us back to schedule the surgery if she decides to have it removed.        Alieyah Spader K. 12/16/2011, 4:03 PM

## 2011-12-16 NOTE — Patient Instructions (Signed)
Call [phone] when you are ready to schedule the surgery.  Ask for our surgery schedulers

## 2011-12-28 ENCOUNTER — Telehealth: Payer: Self-pay | Admitting: Pulmonary Disease

## 2011-12-28 MED ORDER — METOPROLOL TARTRATE 25 MG PO TABS: 25.0000 mg | ORAL_TABLET | Freq: Every day | ORAL | Status: DC

## 2011-12-28 NOTE — Telephone Encounter (Signed)
Spoke with pt. I advised refill for metoprolol was sent to [facility]. Pt aware that we do not administer the shingles vaccine here, and states that she will just discuss this issue with SN at her planned appt in June. Pt aware to keep this appt for future refills. Pt states nothing further needed.

## 2012-02-10 ENCOUNTER — Encounter: Payer: Self-pay | Admitting: *Deleted

## 2012-02-10 ENCOUNTER — Ambulatory Visit (INDEPENDENT_AMBULATORY_CARE_PROVIDER_SITE_OTHER): Payer: Medicare Other | Admitting: Pulmonary Disease

## 2012-02-10 VITALS — BP 120/80 | HR 63 | Temp 96.9°F | Ht 66.5 in | Wt 129.4 lb

## 2012-02-10 DIAGNOSIS — F419 Anxiety disorder, unspecified: Secondary | ICD-10-CM

## 2012-02-10 DIAGNOSIS — E7800 Pure hypercholesterolemia, unspecified: Secondary | ICD-10-CM

## 2012-02-10 DIAGNOSIS — D171 Benign lipomatous neoplasm of skin and subcutaneous tissue of trunk: Secondary | ICD-10-CM

## 2012-02-10 DIAGNOSIS — D1779 Benign lipomatous neoplasm of other sites: Secondary | ICD-10-CM

## 2012-02-10 DIAGNOSIS — Z86010 Personal history of colonic polyps: Secondary | ICD-10-CM

## 2012-02-10 DIAGNOSIS — R002 Palpitations: Secondary | ICD-10-CM

## 2012-02-10 DIAGNOSIS — K589 Irritable bowel syndrome without diarrhea: Secondary | ICD-10-CM

## 2012-02-10 DIAGNOSIS — G43909 Migraine, unspecified, not intractable, without status migrainosus: Secondary | ICD-10-CM

## 2012-02-10 DIAGNOSIS — J309 Allergic rhinitis, unspecified: Secondary | ICD-10-CM

## 2012-02-10 DIAGNOSIS — K219 Gastro-esophageal reflux disease without esophagitis: Secondary | ICD-10-CM

## 2012-02-10 MED ORDER — METOPROLOL TARTRATE 25 MG PO TABS: ORAL_TABLET | ORAL | Status: DC

## 2012-02-10 NOTE — Patient Instructions (Addendum)
Today we updated your med list in our EPIC system...    Continue your current medications the same...    We refilled your Metoprolol per request...  Please return to our lab one morning soon for your FASTING blood work..    We will call you w/ the results when available...  Keep up the great job w/ diet & exercise...  Call for any problems or if we can be of service in any way.Marland KitchenMarland Kitchen

## 2012-02-12 ENCOUNTER — Encounter: Payer: Self-pay | Admitting: Pulmonary Disease

## 2012-02-12 NOTE — Progress Notes (Addendum)
Subjective:     Patient ID: Destiny Sanchez, female   DOB: 11/20/1940, 71 y.o.   MRN: [id number]  HPI 71 y/o WF here for a follow up visit...   ~  September 07, 2007:  She was last seen 2/07 by the nurse practitioner, and 7/06 by myself... she reports not feeling well for the last 2 weeks... she notes occas palpit despite totally stopping caffeine, and has some discomfort in her legs... she notes that she's been under alot of stress "my husb is a heart pt, and my sons have HBP & Chol (one son is a physician- IM/Endocine in [place])...  ~  February 10, 2012:  Destiny Sanchez is here to re-establish after a 4.5 year hiatus & says that she wants blood work done;  Her mother passed away recently at age 3 w/ CHF;  She notes occas palpit but they are brief, infreq & resolve spont> she takes low dose BBlocker & avoids caffeine etc;  She has been followed by [facility] w/ Cardiac work up over the last few years as summarized below;  she saw DrTseui 4/13 for a lipoma on her back & may get this excised in the future...    We reviewed prob list, meds, xrays and labs> see below>> her son is an endocrinologist at [facility] [facility]... LABS 6/13:  FLP- ok on diet x LDL=108;  Chems- wnl;  CBC- wnl;  TSH=3.53...   Problem List:    GLAUCOMA (ICD-365.9) - followed at [facility] by DrWhitaker, on gtts...  ALLERGIC RHINITIS (ICD-477.9) - has seen DrByers for ENT w/ LER...  PALPITATIONS (ICD-785.1) - normal 2DEcho 1993... negative Stress Cardiolite 2003 without ischemia and EF=75%... ~  2DEcho 9/11 showed normal cavity size, normal sys function w/ EF= 60-65%, no regional wall motion problems, Gr1DD  CHEST PAIN, ATYPICAL (ICD-786.59) ~  CXR 7/11 showed normal heart size, clear lungs, mild osteopenia... ~  EKG 9/11 showed NSR, rate76, poss LAE, no STTWA, borderline tracing...  CHOLESTEROL >> on diet alone... ~  FLP 7/11 showed TChol 201, TG 62, HDL 60, LDL 127 ~  FLP 6/13 showed TChol 189, TG 62, HDL 69, LDL 108  GERD (ICD-530.81) - GI  eval DrStark w/ GERD, LER, & Globus phenom... controlled w/ ACIPHEX which she prefers to the other PPIs... (Nexium & Omep without benefit)...  DIVERTICULOSIS >> IRRITABLE BOWEL SYNDROME >> COLON POLYPS >> colonoscopy 11/07 by DrStark showed 5mm polyp (tubular adenoma) f/u planned 5 yrs. ~  She had f/u colonoscopy 11/12 by DrStark w/ divertics & 2 sm polypd removed from transverse colon; path= tubular adenoma & f/u planned 5 yrs.  Hx of MIGRAINE HEADACHE (ICD-346.90) - Hx myofascial pain as well w/ prev accupuncture therapy in 2003 by DrBloom... prev headache eval in the [facility]... ~  Now followed by DrFreeman she reports Rx for Prn Imitrex & ZOLOFT 50mg /d...  ANXIETY (ICD-300.00)  Health Maintenance -  ~  GI:  Followed by DrStark  ~  GYN: DrDickstein in the past for PAP, Mammogram, BMD... ~  Immunizations:  She is encouraged to get the yearly Flu vaccine;  She had the Pneumovax several yrs ago;  She had TDAP 1/13;  We discussed the Shingles vaccine...   Past Medical History  Diagnosis Date  . Allergy   . Blood transfusion 1967 &1971  . Cancer     basal cell on nose  . GERD (gastroesophageal reflux disease)   . Glaucoma   . SVT (supraventricular tachycardia)   . Osteopenia   . Anxiety   .  History of migraine   . IBS (irritable bowel syndrome)     Past Surgical History  Procedure Date  . Cataract extraction w/ intraocular lens  implant, bilateral   . Partial nephrectomy 1971    diverticulum on left  . Abdominal hysterectomy 1975  . Mohs surgery   . Hemorroidectomy 1973  . Abdominal hysterectomy     Outpatient Encounter Prescriptions as of 02/10/2012  Medication Sig Dispense Refill  . bimatoprost (LUMIGAN) 0.01 % SOLN Place 1 drop into both eyes at bedtime.        . brinzolamide (AZOPT) 1 % ophthalmic suspension Place 1 drop into both eyes 3 (three) times daily.        . Calcium Carbonate-Vitamin D (CALTRATE 600+D PO) Take 1,200 mg by mouth daily.        .  Cholecalciferol (VITAMIN D) 2000 UNITS tablet Take 2,000 Units by mouth daily.        . metoprolol tartrate (LOPRESSOR) 25 MG tablet Take 1/2 tablet by mouth two times daily  90 tablet  3  . multivitamin (THERAGRAN) per tablet Take 1 tablet by mouth daily.        Marland Kitchen DISCONTD: metoprolol tartrate (LOPRESSOR) 25 MG tablet Take 1 tablet (25 mg total) by mouth daily.  90 tablet  0  . DISCONTD: metoprolol tartrate (LOPRESSOR) 25 MG tablet Take 1/2 tablet by mouth two times daily      . sertraline (ZOLOFT) 50 MG tablet Take 50 mg by mouth daily.        . SUMAtriptan (IMITREX) 100 MG tablet Take 100 mg by mouth every 2 (two) hours as needed.        Marland Kitchen DISCONTD: metoprolol succinate (TOPROL XL) 25 MG 24 hr tablet Take 1/2 tab twice a day  90 tablet  1    No Known Allergies   Current Medications, Allergies, Past Medical History, Past Surgical History, Family History, and Social History were reviewed in [employer] record.   Review of Systems         The patient complains of occas palpitations.  The patient denies fever, chills, sweats, anorexia, weakness, weight loss, sleep disorder, blurring, diplopia, eye irritation, eye discharge, vision loss, eye pain, photophobia, earache, ear discharge, tinnitus, decreased hearing, nasal congestion, nosebleeds, sore throat, hoarseness, chest pain, syncope, dyspnea on exertion, orthopnea, PND, peripheral edema, cough, dyspnea at rest, excessive sputum, hemoptysis, wheezing, pleurisy, nausea, vomiting, constipation, melena, hematochezia, jaundice, gas/bloating, indigestion/heartburn, dysphagia, odynophagia, dysuria, hematuria, urinary frequency, urinary hesitancy, nocturia, incontinence, back pain, joint pain, joint swelling, muscle weakness, stiffness, arthritis, sciatica, restless legs, leg pain at night, leg pain with exertion, rash, itching, dryness, suspicious lesions, paralysis, paresthesias, seizures, tremors, vertigo, transient blindness,  frequent falls, difficulty walking, depression, anxiety, memory loss, confusion, cold intolerance, heat intolerance, polydipsia, polyphagia, polyuria, unusual weight change, abnormal bruising, bleeding, enlarged lymph nodes, urticaria, allergic rash, hay fever, and recurrent infections.  Note- all other systems neg except as noted...   Objective:   Physical Exam    WD, WN, 71 y/o WF in NAD... GENERAL:  Alert & oriented; pleasant & cooperative. HEENT:  [place]/AT, EOM-wnl, PERRLA, Fundi-benign, EACs-clear, TMs-wnl, NOSE-clear, THROAT-clear & wnl. NECK:  Supple w/ full ROM; no JVD; normal carotid impulses w/o bruits; no thyromegaly or nodules palpated; no lymphadenopathy. CHEST:  Clear to P & A; without wheezes/ rales/ or rhonchi. HEART:  Regular Rhythm; without murmurs/ rubs/ or gallops. ABDOMEN:  Soft & nontender; normal bowel sounds; no organomegaly or masses detected. EXT:  without deformities or arthritic changes; no varicose veins/ venous insuffic/ or edema, pulses intact... NEURO:  CN's intact; motor testing normal; sensory testing normal; gait normal & balance OK. DERM:  No lesions noted; no rash etc...  RADIOLOGY DATA:  Reviewed in the EPIC EMR & discussed w/ the patient...  LABORATORY DATA:  Reviewed in the EPIC EMR & discussed w/ the patient...   Assessment:     Glaucoma>  On eye drops from DrWhitaker at [facility]...  AR>  She uses OTC Antihist etc as needed...  Hx AtypCP, Palpit>  Followed by DrMcLean & stable on Metoprolol (refilled at her request)...  Borderline Chol>  LDL was 108, on diet + exercise...  GERD>  Reminded to take PPI vs H2Blocker as needed for symptoms...  Divertics, IBS, Polyps>  Followed by DrStark & up to date on colonoscopy...  Hx Migraine HAs>  She has Imitrex for prn use from DrFreeman...  Hx Anxiety> she takes Zoloft from [employer]...     Plan:     Patient's Medications  New Prescriptions   No medications on file  Previous Medications    BIMATOPROST (LUMIGAN) 0.01 % SOLN    Place 1 drop into both eyes at bedtime.     BRINZOLAMIDE (AZOPT) 1 % OPHTHALMIC SUSPENSION    Place 1 drop into both eyes 3 (three) times daily.     CALCIUM CARBONATE-VITAMIN D (CALTRATE 600+D PO)    Take 1,200 mg by mouth daily.     CHOLECALCIFEROL (VITAMIN D) 2000 UNITS TABLET    Take 2,000 Units by mouth daily.     MULTIVITAMIN (THERAGRAN) PER TABLET    Take 1 tablet by mouth daily.     SERTRALINE (ZOLOFT) 50 MG TABLET    Take 50 mg by mouth daily.     SUMATRIPTAN (IMITREX) 100 MG TABLET    Take 100 mg by mouth every 2 (two) hours as needed.    Modified Medications   Modified Medication Previous Medication   METOPROLOL TARTRATE (LOPRESSOR) 25 MG TABLET metoprolol tartrate (LOPRESSOR) 25 MG tablet      Take 1/2 tablet by mouth two times daily    Take 1 tablet (25 mg total) by mouth daily.  Discontinued Medications   METOPROLOL SUCCINATE (TOPROL XL) 25 MG 24 HR TABLET    Take 1/2 tab twice a day   METOPROLOL TARTRATE (LOPRESSOR) 25 MG TABLET    Take 1/2 tablet by mouth two times daily

## 2012-02-16 ENCOUNTER — Other Ambulatory Visit (INDEPENDENT_AMBULATORY_CARE_PROVIDER_SITE_OTHER): Payer: Medicare Other

## 2012-02-16 DIAGNOSIS — F411 Generalized anxiety disorder: Secondary | ICD-10-CM

## 2012-02-16 DIAGNOSIS — Z86010 Personal history of colonic polyps: Secondary | ICD-10-CM

## 2012-02-16 DIAGNOSIS — F419 Anxiety disorder, unspecified: Secondary | ICD-10-CM

## 2012-02-16 DIAGNOSIS — E7800 Pure hypercholesterolemia, unspecified: Secondary | ICD-10-CM

## 2012-02-16 DIAGNOSIS — R002 Palpitations: Secondary | ICD-10-CM

## 2012-02-16 LAB — BASIC METABOLIC PANEL
CO2: 26 mEq/L (ref 19–32)
Calcium: 8.9 mg/dL (ref 8.4–10.5)
Glucose, Bld: 86 mg/dL (ref 70–99)
Potassium: 3.9 mEq/L (ref 3.5–5.1)
Sodium: 139 mEq/L (ref 135–145)

## 2012-02-16 LAB — CBC WITH DIFFERENTIAL/PLATELET
Basophils Absolute: 0 10*3/uL (ref 0.0–0.1)
Eosinophils Absolute: 0.2 10*3/uL (ref 0.0–0.7)
HCT: 40.6 % (ref 36.0–46.0)
Hemoglobin: 13.5 g/dL (ref 12.0–15.0)
Lymphocytes Relative: 31.4 % (ref 12.0–46.0)
Lymphs Abs: 1.4 10*3/uL (ref 0.7–4.0)
MCHC: 33.3 g/dL (ref 30.0–36.0)
Neutro Abs: 2.5 10*3/uL (ref 1.4–7.7)
Platelets: 346 10*3/uL (ref 150.0–400.0)
RDW: 13.3 % (ref 11.5–14.6)

## 2012-02-16 LAB — HEPATIC FUNCTION PANEL
AST: 15 U/L (ref 0–37)
Alkaline Phosphatase: 47 U/L (ref 39–117)
Bilirubin, Direct: 0.1 mg/dL (ref 0.0–0.3)
Total Protein: 6.7 g/dL (ref 6.0–8.3)

## 2012-02-16 LAB — TSH: TSH: 3.53 u[IU]/mL (ref 0.35–5.50)

## 2012-02-16 LAB — LIPID PANEL
HDL: 68.7 mg/dL (ref >39.00)
Total CHOL/HDL Ratio: 3

## 2012-02-17 ENCOUNTER — Other Ambulatory Visit: Payer: Self-pay | Admitting: *Deleted

## 2012-02-17 MED ORDER — METOPROLOL TARTRATE 25 MG PO TABS: ORAL_TABLET | ORAL | Status: DC

## 2012-09-04 ENCOUNTER — Other Ambulatory Visit: Payer: Self-pay | Admitting: Dermatology

## 2013-04-16 ENCOUNTER — Telehealth: Payer: Self-pay | Admitting: Pulmonary Disease

## 2013-04-16 MED ORDER — METOPROLOL TARTRATE 25 MG PO TABS: ORAL_TABLET | ORAL | Status: DC

## 2013-04-16 NOTE — Telephone Encounter (Signed)
Last ov 6.13.13 w/ SN, pt to follow up in 1 year >> no appt scheduled, none pending Pt does have an acute ov scheduled with TP tomorrow 8.19.14  Called [facility], spoke with pharmacist Sherri Verbal authorization given to [facility] for #90 w/ 0 additional refills as pt is overdue for appt with SN Med list updated Nothing further needed; will sign off.

## 2013-04-17 ENCOUNTER — Ambulatory Visit (INDEPENDENT_AMBULATORY_CARE_PROVIDER_SITE_OTHER): Payer: Medicare Other | Admitting: Adult Health

## 2013-04-17 ENCOUNTER — Other Ambulatory Visit: Payer: Self-pay | Admitting: Dermatology

## 2013-04-17 ENCOUNTER — Ambulatory Visit (INDEPENDENT_AMBULATORY_CARE_PROVIDER_SITE_OTHER)
Admission: RE | Admit: 2013-04-17 | Discharge: 2013-04-17 | Disposition: A | Discharge status: Home / Self Care | Payer: Medicare Other | Source: Ambulatory Visit | Attending: Adult Health | Admitting: Adult Health

## 2013-04-17 ENCOUNTER — Other Ambulatory Visit (INDEPENDENT_AMBULATORY_CARE_PROVIDER_SITE_OTHER): Payer: Medicare Other

## 2013-04-17 ENCOUNTER — Encounter: Payer: Self-pay | Admitting: Adult Health

## 2013-04-17 VITALS — BP 122/70 | HR 85 | Temp 97.7°F | Ht 66.0 in | Wt 127.0 lb

## 2013-04-17 DIAGNOSIS — R059 Cough, unspecified: Secondary | ICD-10-CM

## 2013-04-17 DIAGNOSIS — R109 Unspecified abdominal pain: Secondary | ICD-10-CM

## 2013-04-17 DIAGNOSIS — K589 Irritable bowel syndrome without diarrhea: Secondary | ICD-10-CM

## 2013-04-17 DIAGNOSIS — R05 Cough: Secondary | ICD-10-CM

## 2013-04-17 DIAGNOSIS — J309 Allergic rhinitis, unspecified: Secondary | ICD-10-CM

## 2013-04-17 NOTE — Progress Notes (Signed)
Subjective:     Patient ID: Destiny WAMSER, female   DOB: Jul 20, 1941, 72 y.o.   MRN: [id number]  HPI  72 y/o WF   ~  September 07, 2007:  She was last seen 2/07 by the nurse practitioner, and 7/06 by myself... she reports not feeling well for the last 2 weeks... she notes occas palpit despite totally stopping caffeine, and has some discomfort in her legs... she notes that she's been under alot of stress "my husb is a heart pt, and my sons have HBP & Chol (one son is a physician- IM/[employer]ocine in [place])...  ~  February 10, 2012:  Destiny Sanchez is here to re-establish after a 4.5 year hiatus & says that she wants blood work done;  Her mother passed away recently at age 58 w/ CHF;  She notes occas palpit but they are brief, infreq & resolve spont> she takes low dose BBlocker & avoids caffeine etc;  She has been followed by [facility] w/ Cardiac work up over the last few years as summarized below;  she saw DrTseui 4/13 for a lipoma on her back & may get this excised in the future...    We reviewed prob list, meds, xrays and labs> see below>> her son is an endocrinologist at [facility]... LABS 6/13:  FLP- ok on diet x LDL=108;  Chems- wnl;  CBC- wnl;  TSH=3.53...  04/17/2013 Acute OV  sporadi  right side with some occasional nausea x3weeks - denies rash, blisters, vomiting.  also c/o prod cough with yellow mucus, head congestion, PND, low grade temp x1week. Cough and congestion are getting better.  Was on vacation to [place] last month. Went on [employer] , returned last week. While on [employer] cruise developed  Right sided abdominal pain 2 episodes . Felt she ate a lot of unusual foods that she is not accustomed.  Had gas and bloating. Some loose stools on/off No calf pain, dyspnea, leg edema.  Leaving for [place] in 2 weeks.  No rash.  No episodes since return from vacation.   Problem List:    GLAUCOMA (ICD-365.9) - followed at [facility] by DrWhitaker, on gtts...  ALLERGIC RHINITIS (ICD-477.9) - has seen  DrByers for ENT w/ LER...  PALPITATIONS (ICD-785.1) - normal 2DEcho 1993... negative Stress Cardiolite 2003 without ischemia and EF=75%... ~  2DEcho 9/11 showed normal cavity size, normal sys function w/ EF= 60-65%, no regional wall motion problems, Gr1DD  CHEST PAIN, ATYPICAL (ICD-786.59) ~  CXR 7/11 showed normal heart size, clear lungs, mild osteopenia... ~  EKG 9/11 showed NSR, rate76, poss LAE, no STTWA, borderline tracing...  CHOLESTEROL >> on diet alone... ~  FLP 7/11 showed TChol 201, TG 62, HDL 60, LDL 127 ~  FLP 6/13 showed TChol 189, TG 62, HDL 69, LDL 108  GERD (ICD-530.81) - GI eval DrStark w/ GERD, LER, & Globus phenom... controlled w/ ACIPHEX which she prefers to the other PPIs... (Nexium & Omep without benefit)...  DIVERTICULOSIS >> IRRITABLE BOWEL SYNDROME >> COLON POLYPS >> colonoscopy 11/07 by DrStark showed 5mm polyp (tubular adenoma) f/u planned 5 yrs. ~  She had f/u colonoscopy 11/12 by DrStark w/ divertics & 2 sm polypd removed from transverse colon; path= tubular adenoma & f/u planned 5 yrs.  Hx of MIGRAINE HEADACHE (ICD-346.90) - Hx myofascial pain as well w/ prev accupuncture therapy in 2003 by DrBloom... prev headache eval in the [place]... ~  Now followed by DrFreeman she reports Rx for Prn Imitrex & ZOLOFT 50mg /d...  ANXIETY (ICD-300.00)  Health Maintenance -  ~  GI:  Followed by DrStark  ~  GYN: DrDickstein in the past for PAP, Mammogram, BMD... ~  Immunizations:  She is encouraged to get the yearly Flu vaccine;  She had the Pneumovax several yrs ago;  She had TDAP 1/13;  We discussed the Shingles vaccine...   Past Medical History  Diagnosis Date  . Allergy   . Blood transfusion 1967 &1971  . Cancer     basal cell on nose  . GERD (gastroesophageal reflux disease)   . Glaucoma   . SVT (supraventricular tachycardia)   . Osteopenia   . Anxiety   . History of migraine   . IBS (irritable bowel syndrome)     Past Surgical History   Procedure Laterality Date  . Cataract extraction w/ intraocular lens  implant, bilateral    . Partial nephrectomy  1971    diverticulum on left  . Abdominal hysterectomy  1975  . Mohs surgery    . Hemorroidectomy  1973  . Abdominal hysterectomy      Outpatient Encounter Prescriptions as of 04/17/2013  Medication Sig Dispense Refill  . bimatoprost (LUMIGAN) 0.01 % SOLN Place 1 drop into both eyes at bedtime.        . brinzolamide (AZOPT) 1 % ophthalmic suspension Place 1 drop into both eyes 3 (three) times daily.        . Calcium Carbonate-Vitamin D (CALTRATE 600+D PO) Take 1,200 mg by mouth daily.        . Cholecalciferol (VITAMIN D) 2000 UNITS tablet Take 2,000 Units by mouth daily.        . metoprolol tartrate (LOPRESSOR) 25 MG tablet Take 1/2 tablet by mouth two times daily  90 tablet  0  . multivitamin (THERAGRAN) per tablet Take 1 tablet by mouth daily.        . sertraline (ZOLOFT) 25 MG tablet Take 25 mg by mouth daily.      . SUMAtriptan (IMITREX) 100 MG tablet Take 100 mg by mouth every 2 (two) hours as needed.        . [DISCONTINUED] sertraline (ZOLOFT) 50 MG tablet Take 50 mg by mouth daily.         No facility-administered encounter medications on file as of 04/17/2013.    No Known Allergies   Current Medications, Allergies, Past Medical History, Past Surgical History, Family History, and Social History were reviewed in [employer] record.   Review of Systems         Constitutional:   No  weight loss, night sweats,  Fevers, chills, fatigue, or  lassitude.  HEENT:   No headaches,  Difficulty swallowing,  Tooth/dental problems, or  Sore throat,                No sneezing, itching, ear ache,  +nasal congestion, post nasal drip,   CV:  No chest pain,  Orthopnea, PND, swelling in lower extremities, anasarca, dizziness, palpitations, syncope.   GI  No heartburn, indigestion, abdominal pain, nausea, vomiting, diarrhea,  loss of appetite, bloody  stools.   Resp: No shortness of breath with exertion or at rest.  .  No change in color of mucus.  No wheezing.  No chest wall deformity  Skin: no rash or lesions.  GU: no dysuria, change in color of urine, no urgency or frequency.  No flank pain, no hematuria   MS:  No joint pain or swelling.  No decreased range of motion.  No back  pain.  Psych:  No change in mood or affect. No depression or anxiety.  No memory loss.        Objective:   Physical Exam     WD, WN, 72y/o WF in NAD... GENERAL:  Alert & oriented; pleasant & cooperative. HEENT:  [place]/AT, EOM-wnl,  EACs-clear, TMs-wnl, NOSE-clear, THROAT-clear & wnl. NECK:  Supple w/ full ROM; no JVD; normal carotid impulses w/o bruits; no thyromegaly or nodules palpated; no lymphadenopathy. CHEST:  Clear to P & A; without wheezes/ rales/ or rhonchi. HEART:  Regular Rhythm; without murmurs/ rubs/ or gallops. Neg homans sign. No edema  ABDOMEN:  Soft & nontender; normal bowel sounds; no organomegaly or masses detected.no guarding or rebound Neg CVA tenderness.  EXT: without deformities or arthritic changes; no varicose veins/ venous insuffic/ or edema, pulses intact... NEURO:   ; gait normal & balance OK. DERM:  No lesions noted; no rash etc...    Assessment:

## 2013-04-17 NOTE — Assessment & Plan Note (Signed)
AR vs URI   Plan  Claritin 10mg  At bedtime As needed  Drainage.  Fluids and rest As needed   Follow up Dr. Kriste Basque  In 3 months and As needed   Please contact office for sooner follow up if symptoms do not improve or worsen or seek emergency care

## 2013-04-17 NOTE — Patient Instructions (Addendum)
Advance bland diet as tolerated Gas x with meals.  Claritin 10mg  At bedtime As needed  Drainage.  Fluids and rest As needed   Follow up Dr. Kriste Basque  In 3 months and As needed   Please contact office for sooner follow up if symptoms do not improve or worsen or seek emergency care

## 2013-04-17 NOTE — Assessment & Plan Note (Signed)
?   IBS vs viral illness  Check labs today   Plan  Advance bland diet as tolerated Gas x with meals.  Claritin 10mg  At bedtime As needed  Drainage.  Fluids and rest As needed   Follow up Dr. Kriste Basque  In 3 months and As needed   Please contact office for sooner follow up if symptoms do not improve or worsen or seek emergency care

## 2013-04-18 LAB — URINE CULTURE: Colony Count: NO GROWTH

## 2013-04-18 LAB — URINALYSIS, ROUTINE W REFLEX MICROSCOPIC
Bilirubin Urine: NEGATIVE
Hgb urine dipstick: NEGATIVE
Total Protein, Urine: NEGATIVE
Urine Glucose: NEGATIVE
Urobilinogen, UA: 0.2 (ref 0.0–1.0)
WBC, UA: NONE SEEN (ref 0–?)

## 2013-04-18 LAB — CBC WITH DIFFERENTIAL/PLATELET
Basophils Absolute: 0.1 10*3/uL (ref 0.0–0.1)
Basophils Relative: 0.8 % (ref 0.0–3.0)
Eosinophils Absolute: 0.2 10*3/uL (ref 0.0–0.7)
Lymphocytes Relative: 22.3 % (ref 12.0–46.0)
MCHC: 34.2 g/dL (ref 30.0–36.0)
Neutrophils Relative %: 73.1 % (ref 43.0–77.0)
Platelets: 484 10*3/uL — ABNORMAL HIGH (ref 150.0–400.0)
RBC: 4.19 Mil/uL (ref 3.87–5.11)
RDW: 13.1 % (ref 11.5–14.6)

## 2013-04-18 LAB — BASIC METABOLIC PANEL
Chloride: 103 mEq/L (ref 96–112)
GFR: 99.9 mL/min (ref >60.00)
Potassium: 3.9 mEq/L (ref 3.5–5.1)
Sodium: 138 mEq/L (ref 135–145)

## 2013-04-18 LAB — LIPASE: Lipase: 43 U/L (ref 11.0–59.0)

## 2013-04-18 LAB — HEPATIC FUNCTION PANEL
Bilirubin, Direct: 0.1 mg/dL (ref 0.0–0.3)
Total Bilirubin: 0.6 mg/dL (ref 0.3–1.2)
Total Protein: 7 g/dL (ref 6.0–8.3)

## 2013-04-18 LAB — AMYLASE: Amylase: 91 U/L (ref 27–131)

## 2013-04-20 ENCOUNTER — Other Ambulatory Visit: Payer: Self-pay | Admitting: Pulmonary Disease

## 2013-04-20 MED ORDER — METOPROLOL TARTRATE 25 MG PO TABS: ORAL_TABLET | ORAL | Status: DC

## 2013-07-05 ENCOUNTER — Other Ambulatory Visit: Payer: Self-pay

## 2013-07-18 ENCOUNTER — Encounter: Payer: Self-pay | Admitting: Pulmonary Disease

## 2013-07-18 ENCOUNTER — Ambulatory Visit (INDEPENDENT_AMBULATORY_CARE_PROVIDER_SITE_OTHER): Payer: Medicare Other | Admitting: Pulmonary Disease

## 2013-07-18 VITALS — BP 112/80 | HR 75 | Temp 97.6°F | Ht 66.0 in | Wt 128.6 lb

## 2013-07-18 DIAGNOSIS — R002 Palpitations: Secondary | ICD-10-CM

## 2013-07-18 DIAGNOSIS — K589 Irritable bowel syndrome without diarrhea: Secondary | ICD-10-CM

## 2013-07-18 DIAGNOSIS — Z860100 Personal history of colon polyps, unspecified: Secondary | ICD-10-CM

## 2013-07-18 DIAGNOSIS — R0789 Other chest pain: Secondary | ICD-10-CM

## 2013-07-18 DIAGNOSIS — K219 Gastro-esophageal reflux disease without esophagitis: Secondary | ICD-10-CM

## 2013-07-18 DIAGNOSIS — H6123 Impacted cerumen, bilateral: Secondary | ICD-10-CM

## 2013-07-18 DIAGNOSIS — G43909 Migraine, unspecified, not intractable, without status migrainosus: Secondary | ICD-10-CM

## 2013-07-18 DIAGNOSIS — H409 Unspecified glaucoma: Secondary | ICD-10-CM

## 2013-07-18 DIAGNOSIS — K5730 Diverticulosis of large intestine without perforation or abscess without bleeding: Secondary | ICD-10-CM | POA: Insufficient documentation

## 2013-07-18 DIAGNOSIS — F411 Generalized anxiety disorder: Secondary | ICD-10-CM

## 2013-07-18 DIAGNOSIS — H6120 Impacted cerumen, unspecified ear: Secondary | ICD-10-CM | POA: Insufficient documentation

## 2013-07-18 DIAGNOSIS — J309 Allergic rhinitis, unspecified: Secondary | ICD-10-CM

## 2013-07-18 MED ORDER — METOPROLOL TARTRATE 25 MG PO TABS: ORAL_TABLET | ORAL | Status: DC

## 2013-07-18 NOTE — Progress Notes (Signed)
Subjective:     Patient ID: Destiny Sanchez, female   DOB: 06-13-1941, 72 y.o.   MRN: [id number]  HPI 72 y/o WF here for a follow up visit...   ~  September 07, 2007:  She was last seen 2/07 by the nurse practitioner, and 7/06 by myself... she reports not feeling well for the last 2 weeks- she notes occas palpit despite totally stopping caffeine, and has some discomfort in her legs... she notes that she's been under alot of stress "my husb is a heart pt, and my sons have HBP & Chol (one son is a physician- [employer] in [place])...  ~  February 10, 2012:  Destiny Sanchez is here to re-establish after a 4.5 year hiatus & says that she wants blood work done;  Her mother passed away recently at age 59 w/ CHF;  She notes occas palpit but they are brief, infreq & resolve spont> she takes low dose BBlocker & avoids caffeine etc;  She has been followed by [facility] w/ Cardiac work up over the last few years as summarized below;  she saw DrTseui 4/13 for a lipoma on her back & may get this excised in the future...    We reviewed prob list, meds, xrays and labs> see below>> her son is an endocrinologist at [facility]... LABS 6/13:  FLP- ok on diet x LDL=108;  Chems- wnl;  CBC- wnl;  TSH=3.53...  ~  July 18, 2013:  34mo ROV & Kelin is still c/o palpitations- skips, irregularity, not racing/ lightheaded, dizzy, etc;  She notes that these often occur w/ activity (eg. walking, playing golf, when hot) & may be assoc w/ SOB;  She last saw DrMcLean in 2011 for similar symptoms- at that time she had a 2DEcho (norm LVF & valves), and a Holter (PACs & short run atrial tachy- no worrisome arrhythmias);  They rec elim of caffeine & low dose BBlocker- Metoprolol25- 1/2Bid... She is still quite concerned about these palpitations and we discussed follow up w/ Cards- DrMcLean w/ poss repeat holter/ event recorder & poss treadmill test...     We reviewed prob list, meds, xrays and labs> see below for updates >> she had the 2014 Flu  vaccine 10/14...  EKG 11/14 showed NSR, rate70, sm Qs inferiorly, otherw NAD...  CXR 8/14 showed norm heart size, clear lungs, DJD in spine & osteopenia of bones... LABS 8/14:  Chems- wnl;  CBC- wnl...           Problem List:    GLAUCOMA (ICD-365.9) - followed at [facility] by DrWhitaker, on gtts...  ALLERGIC RHINITIS (ICD-477.9) - has seen DrByers for ENT w/ LER... Uses OTC antihist as needed...  PALPITATIONS (ICD-785.1) >> on METOPROLOL 25mg - 1/2 bid per DrMcLean... ~  normal 2DEcho 1993...  ~  negative Stress Cardiolite 2003 without ischemia and EF=75%... ~  Eval by Cards 2011- DrMcLean>  2DEcho & holter below- started on Metoprolol 25mg - 1/2 Bid & asked to avoid caffeine etc... ~  2DEcho 9/11 showed normal cavity size, normal sys function w/ EF= 60-65%, no regional wall motion problems, Gr1DD ~  Holter monitor 2011 showed PACs & short run (5beats) of atrial tachy...  CHEST PAIN, ATYPICAL (ICD-786.59) ~  CXR 7/11 showed normal heart size, clear lungs, mild osteopenia... ~  EKG 9/11 showed NSR, rate76, poss LAE, no STTWA, borderline tracing... ~  EKG 11/14 showed NSR, rate70, sm Qs inferiorly, otherw NAD...  CHOLESTEROL >> on diet alone... ~  FLP 7/11 showed TChol 201, TG 62, HDL  60, LDL 127 ~  FLP 6/13 showed TChol 189, TG 62, HDL 69, LDL 108  GERD (ICD-530.81) - GI eval DrStark w/ GERD, LER, & Globus phenom... controlled w/ ACIPHEX which she prefers to the other PPIs... (Nexium & Omep without benefit)...  DIVERTICULOSIS >> IRRITABLE BOWEL SYNDROME >> COLON POLYPS >> colonoscopy 11/07 by DrStark showed 5mm polyp (tubular adenoma) f/u planned 5 yrs. ~  She had f/u colonoscopy 11/12 by DrStark w/ divertics & 2 sm polypd removed from transverse colon; path= tubular adenoma & f/u planned 5 yrs.  Hx of MIGRAINE HEADACHE (ICD-346.90) - Hx myofascial pain as well w/ prev accupuncture therapy in 2003 by DrBloom... prev headache eval in the [place] [facility]... ~  Now followed by DrFreeman she  reports Rx for Prn Imitrex & ZOLOFT 50mg /d...  ANXIETY (ICD-300.00)  Health Maintenance -  ~  GI:  Followed by DrStark  ~  GYN: DrDickstein in the past for PAP, Mammogram, BMD... ~  Immunizations:  She is encouraged to get the yearly Flu vaccine;  She had the Pneumovax several yrs ago;  She had TDAP 1/13;  We discussed the Shingles vaccine...   Past Medical History  Diagnosis Date  . Allergy   . Blood transfusion 1967 &1971  . Cancer     basal cell on nose  . GERD (gastroesophageal reflux disease)   . Glaucoma   . SVT (supraventricular tachycardia)   . Osteopenia   . Anxiety   . History of migraine   . IBS (irritable bowel syndrome)     Past Surgical History  Procedure Laterality Date  . Cataract extraction w/ intraocular lens  implant, bilateral    . Partial nephrectomy  1971    diverticulum on left  . Abdominal hysterectomy  1975  . Mohs surgery    . Hemorroidectomy  1973  . Abdominal hysterectomy      Outpatient Encounter Prescriptions as of 07/18/2013  Medication Sig  . bimatoprost (LUMIGAN) 0.01 % SOLN Place 1 drop into both eyes at bedtime.    . brinzolamide (AZOPT) 1 % ophthalmic suspension Place 1 drop into both eyes 3 (three) times daily.    . Calcium Carbonate-Vitamin D (CALTRATE 600+D PO) Take 1,200 mg by mouth daily.    . Cholecalciferol (VITAMIN D) 2000 UNITS tablet Take 2,000 Units by mouth daily.    . metoprolol tartrate (LOPRESSOR) 25 MG tablet Take 1/2 tablet by mouth two times daily  . multivitamin (THERAGRAN) per tablet Take 1 tablet by mouth daily.    . sertraline (ZOLOFT) 25 MG tablet Take 25 mg by mouth daily.  . SUMAtriptan (IMITREX) 100 MG tablet Take 100 mg by mouth every 2 (two) hours as needed.      No Known Allergies   Current Medications, Allergies, Past Medical History, Past Surgical History, Family History, and Social History were reviewed in [employer] record.   Review of Systems         The patient  complains of occas palpitations.  The patient denies fever, chills, sweats, anorexia, weakness, weight loss, sleep disorder, blurring, diplopia, eye irritation, eye discharge, vision loss, eye pain, photophobia, earache, ear discharge, tinnitus, decreased hearing, nasal congestion, nosebleeds, sore throat, hoarseness, chest pain, syncope, dyspnea on exertion, orthopnea, PND, peripheral edema, cough, dyspnea at rest, excessive sputum, hemoptysis, wheezing, pleurisy, nausea, vomiting, constipation, melena, hematochezia, jaundice, gas/bloating, indigestion/heartburn, dysphagia, odynophagia, dysuria, hematuria, urinary frequency, urinary hesitancy, nocturia, incontinence, back pain, joint pain, joint swelling, muscle weakness, stiffness, arthritis, sciatica, restless legs,  leg pain at night, leg pain with exertion, rash, itching, dryness, suspicious lesions, paralysis, paresthesias, seizures, tremors, vertigo, transient blindness, frequent falls, difficulty walking, depression, anxiety, memory loss, confusion, cold intolerance, heat intolerance, polydipsia, polyphagia, polyuria, unusual weight change, abnormal bruising, bleeding, enlarged lymph nodes, urticaria, allergic rash, hay fever, and recurrent infections.  Note- all other systems neg except as noted...   Objective:   Physical Exam    WD, WN, 72 y/o WF in NAD... GENERAL:  Alert & oriented; pleasant & cooperative. HEENT:  [place]le[place]mann[place]/AT, EOM-wnl, PERRLA, Fundi-benign, EACs-clear, TMs-wnl, NOSE-clear, THROAT-clear & wnl. NECK:  Supple w/ full ROM; no JVD; normal carotid impulses w/o bruits; no thyromegaly or nodules palpated; no lymphadenopathy. CHEST:  Clear to P & A; without wheezes/ rales/ or rhonchi. HEART:  Regular Rhythm; without murmurs/ rubs/ or gallops. ABDOMEN:  Soft & nontender; normal bowel sounds; no organomegaly or masses detected. EXT: without deformities or arthritic changes; no varicose veins/ venous insuffic/ or edema, pulses intact... NEURO:   CN's intact; motor testing normal; sensory testing normal; gait normal & balance OK. DERM:  No lesions noted; no rash etc...  RADIOLOGY DATA:  Reviewed in the EPIC EMR & discussed w/ the patient...  LABORATORY DATA:  Reviewed in the EPIC EMR & discussed w/ the patient...   Assessment:      Glaucoma>  On eye drops from DrWhitaker at [facility]...  AR>  She uses OTC Antihist etc as needed...  Hx AtypCP, Palpit>  Followed by DrMcLean & on Metoprolol 25mg - 1/2 Bid w/ continued symptoms; we discussed incr to 1 tab Bid & f/u Cards...  Borderline Chol>  LDL was 108, on diet + exercise...  GERD>  Reminded to take PPI vs H2Blocker as needed for symptoms...  Divertics, IBS, Polyps>  Followed by DrStark & up to date on colonoscopy...  Hx Migraine HAs>  She has Imitrex for prn use from DrFreeman...  Hx Anxiety> she takes Zoloft from [employer]...     Plan:     Patient's Medications  New Prescriptions   No medications on file  Previous Medications   BIMATOPROST (LUMIGAN) 0.01 % SOLN    Place 1 drop into both eyes at bedtime.     BRINZOLAMIDE (AZOPT) 1 % OPHTHALMIC SUSPENSION    Place 1 drop into both eyes 3 (three) times daily.     CALCIUM CARBONATE-VITAMIN D (CALTRATE 600+D PO)    Take 1,200 mg by mouth daily.     CHOLECALCIFEROL (VITAMIN D) 2000 UNITS TABLET    Take 2,000 Units by mouth daily.     MULTIVITAMIN (THERAGRAN) PER TABLET    Take 1 tablet by mouth daily.     SERTRALINE (ZOLOFT) 25 MG TABLET    Take 25 mg by mouth daily.   SUMATRIPTAN (IMITREX) 100 MG TABLET    Take 100 mg by mouth every 2 (two) hours as needed.    Modified Medications   Modified Medication Previous Medication   METOPROLOL TARTRATE (LOPRESSOR) 25 MG TABLET metoprolol tartrate (LOPRESSOR) 25 MG tablet      Take 1/2 tablet by mouth two times daily    Take 1/2 tablet by mouth two times daily  Discontinued Medications   No medications on file

## 2013-07-18 NOTE — Patient Instructions (Signed)
Today we updated your med list in our EPIC system...    Continue your current medications the same...    We refilled the Metopriolol as requested...  Remember to avoid caffeine etc...  We will arrange for a follow up appt w/ DrMcLean regarding your palpitations (esp in light of their getting worse w/ exercise)...  We will arrange for an ENT consultation regarding the impacted cerumen (to have the ear wax removed)...  Call for any questions.Marland KitchenMarland Kitchen

## 2013-07-25 ENCOUNTER — Ambulatory Visit (INDEPENDENT_AMBULATORY_CARE_PROVIDER_SITE_OTHER): Payer: Medicare Other | Admitting: Physician Assistant

## 2013-07-25 ENCOUNTER — Encounter: Payer: Self-pay | Admitting: Physician Assistant

## 2013-07-25 VITALS — BP 142/88 | HR 62 | Ht 66.0 in | Wt 129.8 lb

## 2013-07-25 DIAGNOSIS — R0602 Shortness of breath: Secondary | ICD-10-CM

## 2013-07-25 DIAGNOSIS — R079 Chest pain, unspecified: Secondary | ICD-10-CM

## 2013-07-25 DIAGNOSIS — R002 Palpitations: Secondary | ICD-10-CM

## 2013-07-25 MED ORDER — METOPROLOL TARTRATE 25 MG PO TABS: ORAL_TABLET | ORAL | Status: DC

## 2013-07-25 NOTE — Progress Notes (Signed)
[address] [place], [place]  [address] Phone: [phone] Fax:  [phone]  Date:  07/25/2013   ID:  Destiny Sanchez, DOB 1941-07-20, MRN [id number]  PCP:  Michele Mcalpine, MD  Cardiologist:  Dr. Marca Ancona     History of Present Illness: Destiny Sanchez is a 72 y.o. female with a history of anxiety, migraines, HL, GERD, palpitations, glaucoma.  She was evaluated by Dr. Shirlee Latch in 2011 for palpitations. Holter monitor demonstrated PACs and a 5 beat run of probable atrial tachycardia. She had no worrisome arrhythmias.  Echo (05/21/2010): EF 60-65%, normal wall motion, grade 1 diastolic dysfunction, PASP 31.  She was asked to try when necessary beta blockers and follow up as needed.  She was recently seen by her PCP with worsening palpitations and asked to follow up here. She has had palpitations for years. It has worsened over the last several months. She notes worsening symptoms with increasing heat. She notes it more when she is playing golf on hot days. She does note dyspnea with exertion. She also notes chest pain with exertion. She describes this as a left-sided pain. She denies any radiating symptoms. She denies syncope. She denies orthopnea, PND or edema. Her shortness of breath is gradually getting worse. She denies rapid palpitations.  Recent Labs: 04/17/2013: ALT 15; Creatinine 0.6; Hemoglobin 13.7; Potassium 3.9   Wt Readings from Last 3 Encounters:  07/25/13 129 lb 12.8 oz (58.877 kg)  07/18/13 128 lb 9.6 oz (58.333 kg)  04/17/13 127 lb (57.607 kg)    Past Medical History:  1. GLAUCOMA (ICD-365.9) - followed at [facility] by Dr Harlon Flor, on gtts...  2. ALLERGIC RHINITIS (ICD-477.9) - has seen DrByers for ENT w/ LER...  3. PALPITATIONS (ICD-785.1) - normal 2DEcho 1993. Echo (9/11): EF 60%, mild diastolic dysfunction, no significant valvular dysfunction, normal RV, PA systolic pressure 31 mmHg. Holter monitor (9/11) with PACs and a short run (5 beats) of atrial  tachycardia. No worrisome arrhythmias.  4. Atypical chest pain: negative Stress Cardiolite 2003 without ischemia and EF=75%  5. GERD (ICD-530.81) - GI eval DrStark w/ GERD, LER, & Globus phenom... controlled w/ ACIPHEX which she prefers to the other PPIs... (Nexium & Omep without benefit)...  6. IRRITABLE BOWEL SYNDROME (ICD-564.1) - last colonoscopy 11/07 by DrStark showed 5mm polyp (tubular adenoma) f/u planned 5 yrs.  7. Hx of MIGRAINE HEADACHE (ICD-346.90) - Hx myofascial pain as well w/ prev accupuncture therapy in 2003 by Dr Wilma Flavin... prev headache eval in the [place] [facility]...  8. ANXIETY (ICD-300.00  Past Medical History  Diagnosis Date  . Allergy   . Blood transfusion 1967 &1971  . Cancer     basal cell on nose  . GERD (gastroesophageal reflux disease)   . Glaucoma   . SVT (supraventricular tachycardia)   . Osteopenia   . Anxiety   . History of migraine   . IBS (irritable bowel syndrome)     Current Outpatient Prescriptions  Medication Sig Dispense Refill  . bimatoprost (LUMIGAN) 0.01 % SOLN Place 1 drop into both eyes at bedtime.        . brinzolamide (AZOPT) 1 % ophthalmic suspension Place 1 drop into both eyes 3 (three) times daily.        . Calcium Carbonate-Vitamin D (CALTRATE 600+D PO) Take 1,200 mg by mouth daily.        . Cholecalciferol (VITAMIN D) 2000 UNITS tablet Take 2,000 Units by mouth daily.        Marland Kitchen  metoprolol tartrate (LOPRESSOR) 25 MG tablet Take 1/2 tablet by mouth two times daily  90 tablet  3  . multivitamin (THERAGRAN) per tablet Take 1 tablet by mouth daily.        . sertraline (ZOLOFT) 25 MG tablet Take 25 mg by mouth daily.      . SUMAtriptan (IMITREX) 100 MG tablet Take 100 mg by mouth every 2 (two) hours as needed.         No current facility-administered medications for this visit.    Allergies:   Review of patient's allergies indicates no known allergies.   Social History:  The patient  reports that she quit smoking about 50 years ago. Her  smoking use included Cigarettes. She has a 10 pack-year smoking history. She has never used smokeless tobacco. She reports that she drinks about 1.8 ounces of alcohol per week. She reports that she does not use illicit drugs.   Family History:  The patient's family history includes Colon cancer in her maternal aunt.   ROS:  Please see the history of present illness.   She has occasional indigestion.   All other systems reviewed and negative.   PHYSICAL EXAM: VS:  BP 142/88  Pulse 62  Ht 5\' 6"  (1.676 m)  Wt 129 lb 12.8 oz (58.877 kg)  BMI 20.96 kg/m2 Well nourished, well developed, in no acute distress HEENT: normal Neck: no JVD Endocrine: No thyromegaly Cardiac:  normal S1, S2; RRR; no murmur Lungs:  clear to auscultation bilaterally, no wheezing, rhonchi or rales Abd: soft, nontender, no hepatomegaly Ext: no edema Skin: warm and dry Neuro:  CNs 2-12 intact, no focal abnormalities noted  EKG:  NSR, HR 62, no change from prior tracing     ASSESSMENT AND PLAN:  1. Chest Pain: She has very few risk factors for coronary artery disease. However, she is a 72 year old female and she has exertional chest pain. I will arrange an ETT-echocardiogram to rule out ischemia. 2. Dyspnea: This is likely related to progressive deconditioning. However, I will arrange an ETT-echocardiogram as noted. 3. Palpitations: By her description, she is likely having PVCs versus PACs. However, her palpitations are becoming more troubling for her. I will obtain a TSH. I will also have her undergo an event monitor x2 weeks to better characterize her palpitations. A Holter monitor would not be adequate as her symptoms are too infrequent.  She can take an extra metoprolol 12.5 mg daily when necessary increased palpitations. 4. Disposition: Followup with Dr. Shirlee Latch in 6-8 weeks.  Signed, Tereso Newcomer, PA-C  07/25/2013 12:10 PM

## 2013-07-25 NOTE — Patient Instructions (Addendum)
Your physician has recommended that you wear an event monitor for 2 weeks . Event monitors are medical devices that record the heart's electrical activity. Doctors most often [place] these monitors to diagnose arrhythmias. Arrhythmias are problems with the speed or rhythm of the heartbeat. The monitor is a small, portable device. You can wear one while you do your normal daily activities. This is usually used to diagnose what is causing palpitations/syncope (passing out).  Your physician has requested that you have a stress echocardiogram.  Please follow instruction sheet as given.  Your physician recommends that you return for lab work in: today tsh   Your physician has recommended you make the following change in your medication:  YOU MAY TAKE AN EXTRA METOPROLOL 12.5 MG A DAY FOR PALPITATIONS///THIS IS 1/2 TABLET   Your physician recommends that you schedule a follow-up appointment in: 6-8 WEEKS WITH DR [facility]

## 2013-08-02 ENCOUNTER — Encounter (INDEPENDENT_AMBULATORY_CARE_PROVIDER_SITE_OTHER): Payer: Medicare Other

## 2013-08-02 ENCOUNTER — Encounter: Payer: Self-pay | Admitting: *Deleted

## 2013-08-02 DIAGNOSIS — R002 Palpitations: Secondary | ICD-10-CM

## 2013-08-02 DIAGNOSIS — R079 Chest pain, unspecified: Secondary | ICD-10-CM

## 2013-08-02 NOTE — Progress Notes (Signed)
Patient ID: TATA TIMMINS, female   DOB: 1941/02/08, 72 y.o.   MRN: [id number] [employer] 14 day cardiac event monitor applied to patient.

## 2013-08-17 ENCOUNTER — Ambulatory Visit (HOSPITAL_COMMUNITY): Payer: Medicare Other | Attending: Cardiology

## 2013-08-17 DIAGNOSIS — R079 Chest pain, unspecified: Secondary | ICD-10-CM | POA: Insufficient documentation

## 2013-08-17 DIAGNOSIS — R002 Palpitations: Secondary | ICD-10-CM | POA: Insufficient documentation

## 2013-08-17 DIAGNOSIS — R072 Precordial pain: Secondary | ICD-10-CM

## 2013-08-17 NOTE — Progress Notes (Signed)
Stress Echocardiogram performed.  

## 2013-08-20 ENCOUNTER — Encounter: Payer: Self-pay | Admitting: Physician Assistant

## 2013-08-29 ENCOUNTER — Encounter: Payer: Self-pay | Admitting: *Deleted

## 2013-09-10 ENCOUNTER — Encounter: Payer: Self-pay | Admitting: Cardiology

## 2013-09-10 ENCOUNTER — Ambulatory Visit (INDEPENDENT_AMBULATORY_CARE_PROVIDER_SITE_OTHER): Payer: Managed Care, Other (non HMO) | Admitting: Cardiology

## 2013-09-10 VITALS — BP 110/80 | HR 64 | Ht 66.0 in | Wt 131.2 lb

## 2013-09-10 DIAGNOSIS — R079 Chest pain, unspecified: Secondary | ICD-10-CM

## 2013-09-10 DIAGNOSIS — I4891 Unspecified atrial fibrillation: Secondary | ICD-10-CM

## 2013-09-10 LAB — BASIC METABOLIC PANEL
BUN: 19 mg/dL (ref 6–23)
CALCIUM: 9 mg/dL (ref 8.4–10.5)
CO2: 29 mEq/L (ref 19–32)
Chloride: 105 mEq/L (ref 96–112)
Creatinine, Ser: 0.5 mg/dL (ref 0.4–1.2)
GFR: 130.92 mL/min (ref >60.00)
Glucose, Bld: 87 mg/dL (ref 70–99)
POTASSIUM: 4 meq/L (ref 3.5–5.1)
SODIUM: 140 meq/L (ref 135–145)

## 2013-09-10 LAB — CBC WITH DIFFERENTIAL/PLATELET
BASOS ABS: 0 10*3/uL (ref 0.0–0.1)
Basophils Relative: 0.7 % (ref 0.0–3.0)
Eosinophils Absolute: 0.2 10*3/uL (ref 0.0–0.7)
Eosinophils Relative: 3.2 % (ref 0.0–5.0)
HCT: 38.1 % (ref 36.0–46.0)
Hemoglobin: 12.9 g/dL (ref 12.0–15.0)
LYMPHS PCT: 23.5 % (ref 12.0–46.0)
Lymphs Abs: 1.5 10*3/uL (ref 0.7–4.0)
MCHC: 33.8 g/dL (ref 30.0–36.0)
MCV: 94.9 fl (ref 78.0–100.0)
MONOS PCT: 7.4 % (ref 3.0–12.0)
Monocytes Absolute: 0.5 10*3/uL (ref 0.1–1.0)
NEUTROS ABS: 4 10*3/uL (ref 1.4–7.7)
Neutrophils Relative %: 65.2 % (ref 43.0–77.0)
Platelets: 408 10*3/uL — ABNORMAL HIGH (ref 150.0–400.0)
RBC: 4.01 Mil/uL (ref 3.87–5.11)
RDW: 13.5 % (ref 11.5–14.6)
WBC: 6.2 10*3/uL (ref 4.5–10.5)

## 2013-09-10 MED ORDER — METOPROLOL SUCCINATE ER 25 MG PO TB24: 25.0000 mg | ORAL_TABLET | Freq: Two times a day (BID) | ORAL | Status: DC

## 2013-09-10 MED ORDER — RIVAROXABAN 20 MG PO TABS: 20.0000 mg | ORAL_TABLET | Freq: Every day | ORAL | Status: DC

## 2013-09-10 NOTE — Patient Instructions (Signed)
Stop metoprolol tartrate.   Start Toprol XL(metoprolol succinate) 25mg  two times a day. Call and let Dr Aundra Dubin know if your blood pressure is too low on the Toprol XL. You can decrease it to 12.5mg  (1/2 of a 25mg  tablet) two times a day if your blood pressure gets too low.   Start Xarelto 20mg  daily.   Your physician recommends that you have lab work today--BMET/CBCd.  Your physician recommends that you schedule a follow-up appointment in: 2 months with Dr Aundra Dubin.

## 2013-09-10 NOTE — Progress Notes (Signed)
Patient ID: Destiny Sanchez, female   DOB: 1941-08-24, 73 y.o.   MRN: [id number] PCP:  Noralee Space, MD   Destiny Sanchez is a 73 y.o. female with a history of anxiety, migraines, HL, GERD, palpitations, glaucoma.  She was evaluated in 2011 for palpitations. Holter monitor demonstrated PACs and a 5 beat run of probable atrial tachycardia. She had no worrisome arrhythmias.  Echo (05/21/2010): EF 60-65%, normal wall motion, grade 1 diastolic dysfunction, PASP 31.  She was asked to try prn beta blockers and follow up as needed.  She was recently seen by her PCP with worsening palpitations and asked to follow up here. She also reported some atypical chest pain. She saw Richardson Dopp in 11/14 and was set up for stress echo and event monitor.  The stress echo was normal.  The event monitor was concerning for several episodes of atrial fibrillation (short runs).  She continues to feel occasional runs of tachypalpitations.  She occasionally has been lightheadedness with tachypalpitations but no syncope.  Runs occur about twice a week.  No history of GI bleeding.   Labs (8/14): K 3.9, creatinine 0.6 Labs (11/14): TSH normal  Wt Readings from Last 3 Encounters:  09/10/13 59.512 kg (131 lb 3.2 oz)  07/25/13 58.877 kg (129 lb 12.8 oz)  07/18/13 58.333 kg (128 lb 9.6 oz)    Past Medical History:  1. GLAUCOMA 2. ALLERGIC RHINITIS: has seen Dr Janace Hoard for ENT w/ LER  3. Atrial fibrillation: normal 2DEcho 1993. Echo (9/11): EF 66%, mild diastolic dysfunction, no significant valvular dysfunction, normal RV, PA systolic pressure 31 mmHg. Holter monitor (9/11) with PACs and a short run (5 beats) of atrial tachycardia. No worrisome arrhythmias.  Event monitor (12/14) with several runs of suspected atrial fibrillation.  4. Atypical chest pain: negative Stress Cardiolite 2003 without ischemia and EF=75%.  Stress echo (12/14) with EF 65%, no ischemia or infarction.  5. GERD:  GI eval Dr Fuller Plan w/ GERD, LER, & Globus  phenom... controlled w/ ACIPHEX which she prefers to the other PPIs... (Nexium & Omep without benefit)...  6. IRRITABLE BOWEL SYNDROME:  last colonoscopy 11/07 by DrStark showed 80mm polyp (tubular adenoma) f/u planned 5 yrs.  7. Hx of MIGRAINE HEADACHE: Hx myofascial pain as well w/ prev accupuncture therapy in 2003 by Dr Gust Rung... prev headache eval in the [place] [facility]...  8. ANXIETY  Current Outpatient Prescriptions  Medication Sig Dispense Refill  . bimatoprost (LUMIGAN) 0.01 % SOLN Place 1 drop into both eyes at bedtime.        . brinzolamide (AZOPT) 1 % ophthalmic suspension Place 1 drop into both eyes 3 (three) times daily.        . Calcium Carbonate-Vitamin D (CALTRATE 600+D PO) Take 1,200 mg by mouth daily.        . Cholecalciferol (VITAMIN D) 2000 UNITS tablet Take 2,000 Units by mouth daily.        . multivitamin (THERAGRAN) per tablet Take 1 tablet by mouth daily.        . sertraline (ZOLOFT) 25 MG tablet Take 25 mg by mouth daily.      . SUMAtriptan (IMITREX) 100 MG tablet Take 100 mg by mouth every 2 (two) hours as needed.        . metoprolol succinate (TOPROL XL) 25 MG 24 hr tablet Take 1 tablet (25 mg total) by mouth 2 (two) times daily.  60 tablet  6  . Rivaroxaban (XARELTO) 20 MG TABS tablet Take 1 tablet (  20 mg total) by mouth daily with supper.  30 tablet  6   No current facility-administered medications for this visit.    Allergies:   Review of patient's allergies indicates no known allergies.   Social History:  The patient  reports that she quit smoking about 51 years ago. Her smoking use included Cigarettes. She has a 10 pack-year smoking history. She has never used smokeless tobacco. She reports that she drinks about 1.8 ounces of alcohol per week. She reports that she does not use illicit drugs.   Family History:  The patient's family history includes Colon cancer in her maternal aunt; Throat cancer in her father.   ROS:  All systems reviewed and negative except as  per HPI  PHYSICAL EXAM: VS:  BP 110/80  Pulse 64  Ht 5\' 6"  (1.676 m)  Wt 59.512 kg (131 lb 3.2 oz)  BMI 21.19 kg/m2 Well nourished, well developed, in no acute distress HEENT: normal Neck: no JVD Endocrine: No thyromegaly Cardiac:  normal S1, S2; RRR; no murmur Lungs:  clear to auscultation bilaterally, no wheezing, rhonchi or rales Abd: soft, nontender, no hepatomegaly Ext: no edema Skin: warm and dry  Assessment/Plan: 1. Chest pain: Negative stress echo.  Suspect noncardiac chest pain.  2. Atrial fibrillation: Paroxysmal.  Noted by event monitor.  She has symptomatic tachypalpitations when she goes into atrial fibrillation.  - CHADSVASC = 2, no history of bleeding.  I think that it would be reasonable to start her on Xarelto 20 mg daily.  - Given symptomatic palpitations, I will stop metoprolol and try her on Toprol XL 25 mg bid to see if this controls her symptoms better.  - Check BMET/CBC today.   Loralie Champagne 09/11/2013

## 2013-09-11 DIAGNOSIS — I4819 Other persistent atrial fibrillation: Secondary | ICD-10-CM | POA: Insufficient documentation

## 2013-11-12 ENCOUNTER — Ambulatory Visit (INDEPENDENT_AMBULATORY_CARE_PROVIDER_SITE_OTHER): Payer: Managed Care, Other (non HMO) | Admitting: Cardiology

## 2013-11-12 ENCOUNTER — Ambulatory Visit: Payer: Managed Care, Other (non HMO) | Admitting: Cardiology

## 2013-11-12 ENCOUNTER — Encounter: Payer: Self-pay | Admitting: Cardiology

## 2013-11-12 VITALS — BP 112/70 | HR 74 | Ht 66.0 in | Wt 129.0 lb

## 2013-11-12 DIAGNOSIS — R079 Chest pain, unspecified: Secondary | ICD-10-CM

## 2013-11-12 DIAGNOSIS — R002 Palpitations: Secondary | ICD-10-CM

## 2013-11-12 DIAGNOSIS — I4891 Unspecified atrial fibrillation: Secondary | ICD-10-CM

## 2013-11-12 LAB — CBC WITH DIFFERENTIAL/PLATELET
BASOS PCT: 0.7 % (ref 0.0–3.0)
Basophils Absolute: 0 10*3/uL (ref 0.0–0.1)
EOS PCT: 2.9 % (ref 0.0–5.0)
Eosinophils Absolute: 0.2 10*3/uL (ref 0.0–0.7)
HEMATOCRIT: 37.8 % (ref 36.0–46.0)
Hemoglobin: 12.7 g/dL (ref 12.0–15.0)
LYMPHS ABS: 1.4 10*3/uL (ref 0.7–4.0)
Lymphocytes Relative: 20.7 % (ref 12.0–46.0)
MCHC: 33.5 g/dL (ref 30.0–36.0)
MCV: 96.9 fl (ref 78.0–100.0)
MONO ABS: 0.5 10*3/uL (ref 0.1–1.0)
Monocytes Relative: 6.6 % (ref 3.0–12.0)
Neutro Abs: 4.8 10*3/uL (ref 1.4–7.7)
Neutrophils Relative %: 69.1 % (ref 43.0–77.0)
Platelets: 378 10*3/uL (ref 150.0–400.0)
RBC: 3.9 Mil/uL (ref 3.87–5.11)
RDW: 13.8 % (ref 11.5–14.6)
WBC: 7 10*3/uL (ref 4.5–10.5)

## 2013-11-12 MED ORDER — RIVAROXABAN 20 MG PO TABS: 20.0000 mg | ORAL_TABLET | Freq: Every day | ORAL | Status: DC

## 2013-11-12 NOTE — Patient Instructions (Signed)
Your physician recommends that you have  lab work today--CBCd  Your physician wants you to follow-up in: 6 months with Dr Aundra Dubin. (September 2015). You will receive a reminder letter in the mail two months in advance. If you don't receive a letter, please call our office to schedule the follow-up appointment.

## 2013-11-12 NOTE — Progress Notes (Signed)
Patient ID: Destiny Sanchez, female   DOB: 16-Jun-1941, 73 y.o.   MRN: [id number] PCP:  Noralee Space, MD   Destiny Sanchez is a 73 y.o. female with a history of anxiety, migraines, HL, GERD, palpitations, glaucoma.  She was evaluated in 2011 for palpitations. Holter monitor demonstrated PACs and a 5 beat run of probable atrial tachycardia. She had no worrisome arrhythmias.  Echo (05/21/2010): EF 60-65%, normal wall motion, grade 1 diastolic dysfunction, PASP 31.  She was asked to try prn beta blockers and follow up as needed.  She was seen by her PCP with worsening palpitations and asked to follow up here. She also reported some atypical chest pain. She saw Richardson Dopp in 11/14 and was set up for stress echo and event monitor.  The stress echo was normal.  The event monitor was concerning for several episodes of atrial fibrillation (short runs).  No history of GI bleeding.  She was started on Xarelto 20 mg daily and metoprolol was transitioned to Toprol XL.  Since then, she has had much less palpitations.  She has had only 3-4 episodes in the last 2 months. No further chest pain, no exertional dyspnea.   Labs (8/14): K 3.9, creatinine 0.6 Labs (11/14): TSH normal Labs (1/15): K 4, creatinine 0.5, HCT 38.1  Wt Readings from Last 3 Encounters:  11/12/13 58.514 kg (129 lb)  09/10/13 59.512 kg (131 lb 3.2 oz)  07/25/13 58.877 kg (129 lb 12.8 oz)    Past Medical History:  1. GLAUCOMA 2. ALLERGIC RHINITIS: has seen Dr Janace Hoard for ENT w/ LER  3. Atrial fibrillation: normal 2DEcho 1993. Echo (9/11): EF 62%, mild diastolic dysfunction, no significant valvular dysfunction, normal RV, PA systolic pressure 31 mmHg. Holter monitor (9/11) with PACs and a short run (5 beats) of atrial tachycardia. No worrisome arrhythmias.  Event monitor (12/14) with several runs of suspected atrial fibrillation.  4. Atypical chest pain: negative Stress Cardiolite 2003 without ischemia and EF=75%.  Stress echo (12/14) with  EF 65%, no ischemia or infarction.  5. GERD:  GI eval Dr Fuller Plan w/ GERD, LER, & Globus phenom... controlled w/ ACIPHEX which she prefers to the other PPIs... (Nexium & Omep without benefit)...  6. IRRITABLE BOWEL SYNDROME:  last colonoscopy 11/07 by DrStark showed 20mm polyp (tubular adenoma) f/u planned 5 yrs.  7. Hx of MIGRAINE HEADACHE: Hx myofascial pain as well w/ prev accupuncture therapy in 2003 by Dr Gust Rung... prev headache eval in the [facility]...  8. ANXIETY  Current Outpatient Prescriptions  Medication Sig Dispense Refill  . bimatoprost (LUMIGAN) 0.01 % SOLN Place 1 drop into both eyes at bedtime.        . brinzolamide (AZOPT) 1 % ophthalmic suspension Place 1 drop into both eyes 3 (three) times daily.        . Calcium Carbonate-Vitamin D (CALTRATE 600+D PO) Take 1,200 mg by mouth daily.        . Cholecalciferol (VITAMIN D) 2000 UNITS tablet Take 2,000 Units by mouth daily.        . metoprolol succinate (TOPROL XL) 25 MG 24 hr tablet Take 1 tablet (25 mg total) by mouth 2 (two) times daily.  60 tablet  6  . multivitamin (THERAGRAN) per tablet Take 1 tablet by mouth daily.        . Rivaroxaban (XARELTO) 20 MG TABS tablet Take 1 tablet (20 mg total) by mouth daily with supper.  30 tablet  11  . sertraline (ZOLOFT) 25 MG  tablet Take 25 mg by mouth daily.      . SUMAtriptan (IMITREX) 100 MG tablet Take 100 mg by mouth every 2 (two) hours as needed.         No current facility-administered medications for this visit.    Allergies:   Review of patient's allergies indicates no known allergies.   Social History:  The patient  reports that she quit smoking about 51 years ago. Her smoking use included Cigarettes. She has a 10 pack-year smoking history. She has never used smokeless tobacco. She reports that she drinks about 1.8 ounces of alcohol per week. She reports that she does not use illicit drugs.   Family History:  The patient's family history includes Colon cancer in her maternal  aunt; Throat cancer in her father.   PHYSICAL EXAM: VS:  BP 112/70  Pulse 74  Ht 5\' 6"  (1.676 m)  Wt 58.514 kg (129 lb)  BMI 20.83 kg/m2 Well nourished, well developed, in no acute distress HEENT: normal Neck: no JVD Endocrine: No thyromegaly Cardiac:  normal S1, S2; RRR; no murmur Lungs:  clear to auscultation bilaterally, no wheezing, rhonchi or rales Abd: soft, nontender, no hepatomegaly Ext: no edema Skin: warm and dry  Assessment/Plan: 1. Chest pain: Negative stress echo.  Suspect noncardiac chest pain.  2. Atrial fibrillation: Paroxysmal.  Noted by event monitor.  She has symptomatic tachypalpitations when she goes into atrial fibrillation.  - CHADSVASC = 2, no history of bleeding.  She is on Xarelto 20 mg daily and will continue. - She is doing well on Toprol XL, continue current dose.  - Check BMET/CBC at followup in 6 months. Will also get CBC today since we've started Xarelto.   Loralie Champagne 11/12/2013

## 2014-03-31 IMAGING — CR DG CHEST 2V
2 series · 2 of 2 positions shown · non-contrast
Comparison: 03/10/2010 study.

CLINICAL DATA: Chest pain in the lower right side for 2 weeks.
History of coughing and hypertension.  History of being former
smoker.

CHEST - 2 VIEW

[view not recorded (1 of 2)]
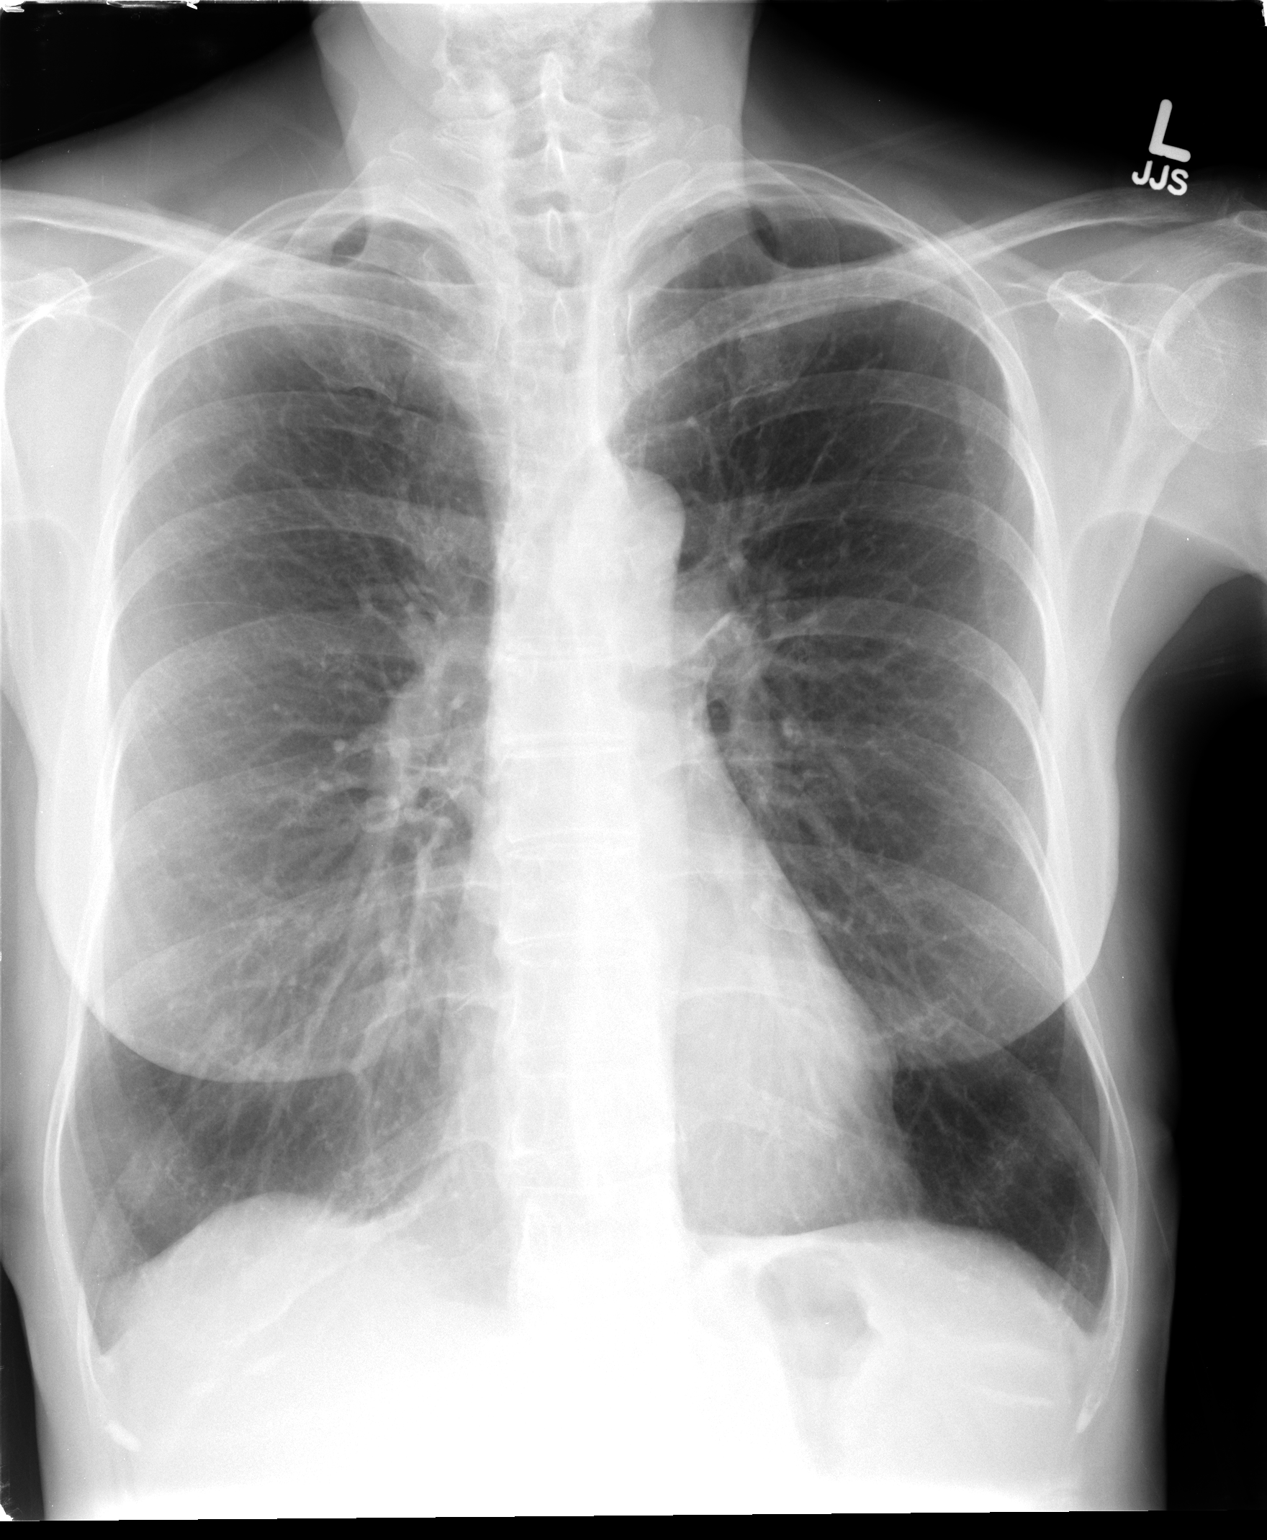

[view not recorded (2 of 2)]
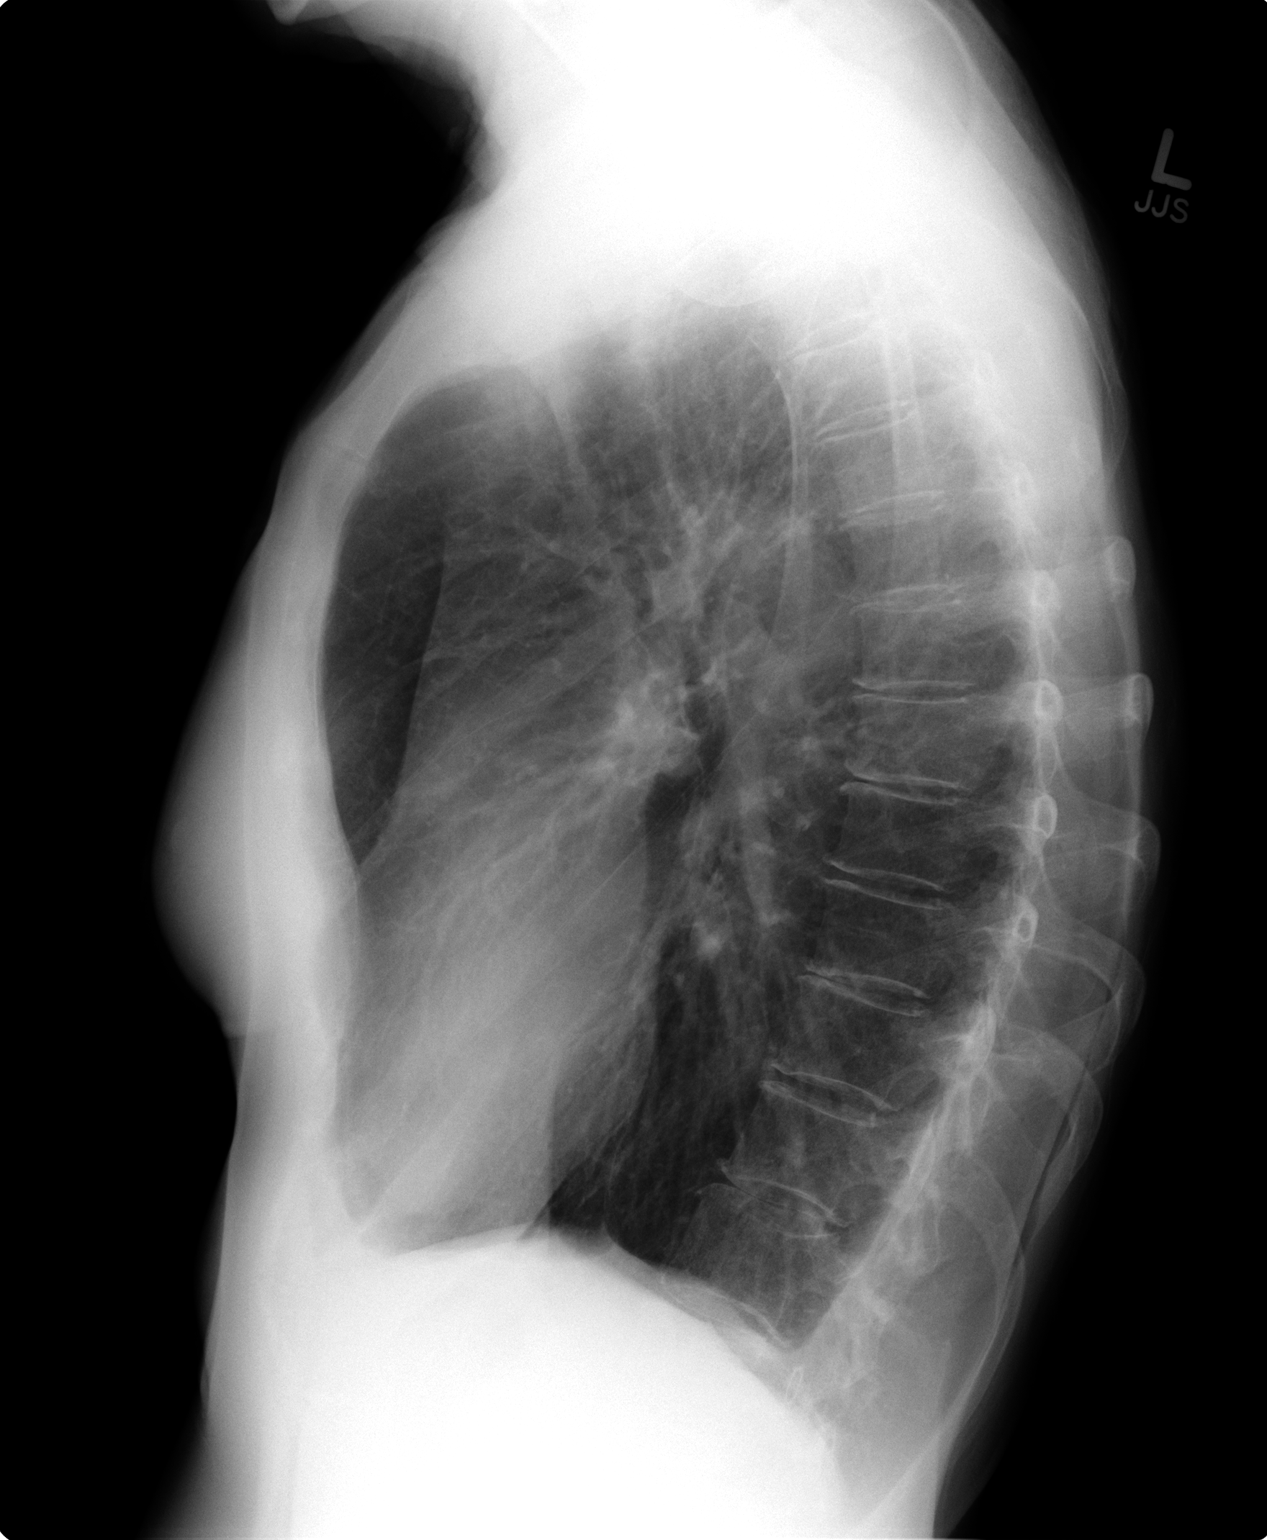

[2 of 2 positions shown; findings below may reference images not displayed]

FINDINGS: Cardiac silhouette is normal size and shape and
unchanged.  No mediastinal or hilar lesions are seen.  There is a
generalized hyperinflation configuration consistent with COPD.  No
pulmonary edema, pneumonia, or masses are seen. No pleural
abnormality is evident.  There is osteopenic appearance of bones.
Changes of degenerative disc disease and degenerative spondylosis
are present.
IMPRESSION: Chronic hyperinflation configuration consistent with COPD.  No
acute superimposed pulmonary or pleural abnormality is seen.
Stable chronic findings are detailed above.

## 2014-04-03 ENCOUNTER — Other Ambulatory Visit: Payer: Self-pay | Admitting: Cardiology

## 2014-05-03 ENCOUNTER — Other Ambulatory Visit: Payer: Self-pay | Admitting: Cardiology

## 2014-05-14 ENCOUNTER — Encounter: Payer: Self-pay | Admitting: Cardiology

## 2014-05-14 ENCOUNTER — Ambulatory Visit (INDEPENDENT_AMBULATORY_CARE_PROVIDER_SITE_OTHER): Payer: Managed Care, Other (non HMO) | Admitting: Cardiology

## 2014-05-14 VITALS — BP 120/74 | HR 64 | Ht 66.0 in | Wt 126.4 lb

## 2014-05-14 DIAGNOSIS — R002 Palpitations: Secondary | ICD-10-CM

## 2014-05-14 DIAGNOSIS — I4891 Unspecified atrial fibrillation: Secondary | ICD-10-CM

## 2014-05-14 DIAGNOSIS — I480 Paroxysmal atrial fibrillation: Secondary | ICD-10-CM

## 2014-05-14 LAB — BASIC METABOLIC PANEL
BUN: 16 mg/dL (ref 6–23)
CALCIUM: 9.2 mg/dL (ref 8.4–10.5)
CO2: 25 meq/L (ref 19–32)
Chloride: 104 mEq/L (ref 96–112)
Creatinine, Ser: 0.5 mg/dL (ref 0.4–1.2)
GFR: 122.02 mL/min (ref >60.00)
GLUCOSE: 86 mg/dL (ref 70–99)
Potassium: 3.7 mEq/L (ref 3.5–5.1)
Sodium: 138 mEq/L (ref 135–145)

## 2014-05-14 MED ORDER — METOPROLOL SUCCINATE ER 25 MG PO TB24: ORAL_TABLET | ORAL | Status: DC

## 2014-05-14 MED ORDER — RIVAROXABAN 20 MG PO TABS: ORAL_TABLET | ORAL | Status: DC

## 2014-05-14 NOTE — Patient Instructions (Signed)
Your physician recommends that you have  lab work today--BMET/CBCd  Your physician wants you to follow-up in: 6 months with Dr Aundra Dubin. (March 2016).  You will receive a reminder letter in the mail two months in advance. If you don't receive a letter, please call our office to schedule the follow-up appointment.

## 2014-05-15 LAB — CBC WITH DIFFERENTIAL/PLATELET
Basophils Absolute: 0.1 10*3/uL (ref 0.0–0.1)
Basophils Relative: 0.9 % (ref 0.0–3.0)
Eosinophils Absolute: 0.2 10*3/uL (ref 0.0–0.7)
Eosinophils Relative: 2 % (ref 0.0–5.0)
HCT: 39.5 % (ref 36.0–46.0)
Hemoglobin: 13.5 g/dL (ref 12.0–15.0)
Lymphocytes Relative: 21.3 % (ref 12.0–46.0)
Lymphs Abs: 1.6 10*3/uL (ref 0.7–4.0)
MCHC: 34.1 g/dL (ref 30.0–36.0)
MCV: 94.5 fl (ref 78.0–100.0)
MONO ABS: 0.4 10*3/uL (ref 0.1–1.0)
Monocytes Relative: 5.1 % (ref 3.0–12.0)
NEUTROS PCT: 70.7 % (ref 43.0–77.0)
Neutro Abs: 5.5 10*3/uL (ref 1.4–7.7)
PLATELETS: 410 10*3/uL — AB (ref 150.0–400.0)
RBC: 4.18 Mil/uL (ref 3.87–5.11)
RDW: 13.4 % (ref 11.5–15.5)
WBC: 7.7 10*3/uL (ref 4.0–10.5)

## 2014-05-16 NOTE — Progress Notes (Signed)
Patient ID: Destiny Sanchez, female   DOB: 01-Jul-1941, 73 y.o.   MRN: [id number] PCP:  Noralee Space, MD   Destiny Sanchez is a 73 y.o. female with a history of anxiety, migraines, HL, GERD, palpitations, glaucoma, and paroxysmal atrial fibrillation.  She saw Richardson Dopp in 11/14 and was set up for stress echo and event monitor.  The stress echo was normal.  The event monitor was concerning for several episodes of atrial fibrillation (short runs).  No history of GI bleeding.  She was started on Xarelto 20 mg daily and metoprolol was transitioned to Toprol XL.    She has occasional palpitations, none long-lasting.  She is in NSR today.  No chest pain.  She gets a fair amount of exercise.  Mild dyspnea walking up a hill.  Of note, her husband had a cardiac arrest this summer.  He has recovered and is doing better.   Labs (8/14): K 3.9, creatinine 0.6 Labs (11/14): TSH normal Labs (1/15): K 4, creatinine 0.5, HCT 38.1 Labs (3/15): K 4, creatinine 0.5, HCT 37.8  Wt Readings from Last 3 Encounters:  05/14/14 126 lb 6.4 oz (57.335 kg)  11/12/13 129 lb (58.514 kg)  09/10/13 131 lb 3.2 oz (59.512 kg)    Past Medical History:  1. GLAUCOMA 2. ALLERGIC RHINITIS: has seen Dr Janace Hoard for ENT w/ LER  3. Atrial fibrillation: normal 2DEcho 1993. Echo (9/11): EF 13%, mild diastolic dysfunction, no significant valvular dysfunction, normal RV, PA systolic pressure 31 mmHg. Holter monitor (9/11) with PACs and a short run (5 beats) of atrial tachycardia. No worrisome arrhythmias.  Event monitor (12/14) with several runs of suspected atrial fibrillation.  4. Atypical chest pain: negative Stress Cardiolite 2003 without ischemia and EF=75%.  Stress echo (12/14) with EF 65%, no ischemia or infarction.  5. GERD:  GI eval Dr Fuller Plan w/ GERD, LER, & Globus phenom... controlled w/ ACIPHEX which she prefers to the other PPIs... (Nexium & Omep without benefit)...  6. IRRITABLE BOWEL SYNDROME:  last colonoscopy 11/07 by  DrStark showed 71mm polyp (tubular adenoma) f/u planned 5 yrs.  7. Hx of MIGRAINE HEADACHE: Hx myofascial pain as well w/ prev accupuncture therapy in 2003 by Dr Gust Rung... prev headache eval in the [facility]...  8. ANXIETY  Current Outpatient Prescriptions  Medication Sig Dispense Refill  . brinzolamide (AZOPT) 1 % ophthalmic suspension Place 1 drop into both eyes 3 (three) times daily.        . Calcium Carbonate-Vitamin D (CALTRATE 600+D PO) Take 1,200 mg by mouth daily.        . Cholecalciferol (VITAMIN D) 2000 UNITS tablet Take 2,000 Units by mouth daily.        . metoprolol succinate (TOPROL-XL) 25 MG 24 hr tablet TAKE ONE TABLET BY MOUTH TWICE DAILY.  180 tablet  1  . multivitamin (THERAGRAN) per tablet Take 1 tablet by mouth daily.        . rivaroxaban (XARELTO) 20 MG TABS tablet TAKE ONE TABLET BY MOUTH ONCE DAILY WITH  SUPPER.  90 tablet  1   No current facility-administered medications for this visit.    Allergies:   Review of patient's allergies indicates no known allergies.   Social History:  The patient  reports that she quit smoking about 51 years ago. Her smoking use included Cigarettes. She has a 10 pack-year smoking history. She has never used smokeless tobacco. She reports that she drinks about 1.8 ounces of alcohol per week. She reports that  she does not use illicit drugs.   Family History:  The patient's family history includes Colon cancer in her maternal aunt; Throat cancer in her father.   PHYSICAL EXAM: VS:  BP 120/74  Pulse 64  Ht 5\' 6"  (1.676 m)  Wt 126 lb 6.4 oz (57.335 kg)  BMI 20.41 kg/m2 Well nourished, well developed, in no acute distress HEENT: normal Neck: no JVD Endocrine: No thyromegaly Cardiac:  normal S1, S2; RRR; no murmur Lungs:  clear to auscultation bilaterally, no wheezing, rhonchi or rales Abd: soft, nontender, no hepatomegaly Ext: no edema Skin: warm and dry  Assessment/Plan: 1. Chest pain: Negative stress echo in 2014.  Suspect  noncardiac chest pain, no recurrence.  2. Atrial fibrillation: Paroxysmal.  Noted by event monitor.  She has symptomatic tachypalpitations when she goes into atrial fibrillation.  - CHADSVASC = 3, no history of bleeding.  She is on Xarelto 20 mg daily and will continue. - She is doing well on Toprol XL, continue current dose.  - Check BMET/CBC today.   Loralie Champagne 05/16/2014

## 2014-05-17 ENCOUNTER — Ambulatory Visit: Payer: Managed Care, Other (non HMO) | Admitting: Cardiology

## 2014-08-02 ENCOUNTER — Telehealth: Payer: Self-pay | Admitting: Pulmonary Disease

## 2014-08-02 NOTE — Telephone Encounter (Signed)
Called and spoke with pt and she is aware of appt with SN on 12-18 at 90.  Nothing further is needed.

## 2014-08-16 ENCOUNTER — Other Ambulatory Visit (INDEPENDENT_AMBULATORY_CARE_PROVIDER_SITE_OTHER): Payer: Managed Care, Other (non HMO)

## 2014-08-16 ENCOUNTER — Ambulatory Visit (INDEPENDENT_AMBULATORY_CARE_PROVIDER_SITE_OTHER): Payer: Managed Care, Other (non HMO) | Admitting: Pulmonary Disease

## 2014-08-16 ENCOUNTER — Ambulatory Visit (INDEPENDENT_AMBULATORY_CARE_PROVIDER_SITE_OTHER)
Admission: RE | Admit: 2014-08-16 | Discharge: 2014-08-16 | Disposition: A | Discharge status: Home / Self Care | Payer: Managed Care, Other (non HMO) | Source: Ambulatory Visit | Attending: Pulmonary Disease | Admitting: Pulmonary Disease

## 2014-08-16 ENCOUNTER — Encounter: Payer: Self-pay | Admitting: Pulmonary Disease

## 2014-08-16 VITALS — BP 100/72 | HR 83 | Temp 97.5°F | Ht 66.0 in | Wt 129.4 lb

## 2014-08-16 DIAGNOSIS — K589 Irritable bowel syndrome without diarrhea: Secondary | ICD-10-CM

## 2014-08-16 DIAGNOSIS — F411 Generalized anxiety disorder: Secondary | ICD-10-CM

## 2014-08-16 DIAGNOSIS — K5730 Diverticulosis of large intestine without perforation or abscess without bleeding: Secondary | ICD-10-CM

## 2014-08-16 DIAGNOSIS — J301 Allergic rhinitis due to pollen: Secondary | ICD-10-CM

## 2014-08-16 DIAGNOSIS — J209 Acute bronchitis, unspecified: Secondary | ICD-10-CM

## 2014-08-16 DIAGNOSIS — Z86010 Personal history of colonic polyps: Secondary | ICD-10-CM

## 2014-08-16 DIAGNOSIS — K219 Gastro-esophageal reflux disease without esophagitis: Secondary | ICD-10-CM

## 2014-08-16 DIAGNOSIS — I480 Paroxysmal atrial fibrillation: Secondary | ICD-10-CM

## 2014-08-16 DIAGNOSIS — R0789 Other chest pain: Secondary | ICD-10-CM

## 2014-08-16 DIAGNOSIS — G43009 Migraine without aura, not intractable, without status migrainosus: Secondary | ICD-10-CM

## 2014-08-16 LAB — HEPATIC FUNCTION PANEL
ALT: 14 U/L (ref 0–35)
AST: 18 U/L (ref 0–37)
Albumin: 4 g/dL (ref 3.5–5.2)
Alkaline Phosphatase: 49 U/L (ref 39–117)
BILIRUBIN TOTAL: 0.7 mg/dL (ref 0.2–1.2)
Bilirubin, Direct: 0.1 mg/dL (ref 0.0–0.3)
Total Protein: 7 g/dL (ref 6.0–8.3)

## 2014-08-16 LAB — CBC WITH DIFFERENTIAL/PLATELET
BASOS PCT: 0.5 % (ref 0.0–3.0)
Basophils Absolute: 0 10*3/uL (ref 0.0–0.1)
Eosinophils Absolute: 0.2 10*3/uL (ref 0.0–0.7)
Eosinophils Relative: 2.2 % (ref 0.0–5.0)
HEMATOCRIT: 40.8 % (ref 36.0–46.0)
HEMOGLOBIN: 13.4 g/dL (ref 12.0–15.0)
LYMPHS ABS: 1.1 10*3/uL (ref 0.7–4.0)
Lymphocytes Relative: 15.1 % (ref 12.0–46.0)
MCHC: 32.7 g/dL (ref 30.0–36.0)
MCV: 96.9 fl (ref 78.0–100.0)
MONOS PCT: 6.9 % (ref 3.0–12.0)
Monocytes Absolute: 0.5 10*3/uL (ref 0.1–1.0)
Neutro Abs: 5.5 10*3/uL (ref 1.4–7.7)
Neutrophils Relative %: 75.3 % (ref 43.0–77.0)
Platelets: 430 10*3/uL — ABNORMAL HIGH (ref 150.0–400.0)
RBC: 4.21 Mil/uL (ref 3.87–5.11)
RDW: 13.6 % (ref 11.5–15.5)
WBC: 7.2 10*3/uL (ref 4.0–10.5)

## 2014-08-16 LAB — BASIC METABOLIC PANEL
BUN: 15 mg/dL (ref 6–23)
CALCIUM: 9.2 mg/dL (ref 8.4–10.5)
CO2: 26 mEq/L (ref 19–32)
Chloride: 105 mEq/L (ref 96–112)
Creatinine, Ser: 0.5 mg/dL (ref 0.4–1.2)
GFR: 130.59 mL/min (ref >60.00)
GLUCOSE: 99 mg/dL (ref 70–99)
POTASSIUM: 3.9 meq/L (ref 3.5–5.1)
Sodium: 139 mEq/L (ref 135–145)

## 2014-08-16 LAB — LIPID PANEL
CHOL/HDL RATIO: 4
CHOLESTEROL: 173 mg/dL (ref 0–200)
HDL: 43.7 mg/dL (ref >39.00)
LDL CALC: 111 mg/dL — AB (ref 0–99)
NonHDL: 129.3
Triglycerides: 93 mg/dL (ref 0.0–149.0)
VLDL: 18.6 mg/dL (ref 0.0–40.0)

## 2014-08-16 MED ORDER — METHYLPREDNISOLONE ACETATE 80 MG/ML IJ SUSP
80.0000 mg | Freq: Once | INTRAMUSCULAR | Status: AC
  Administered 2014-08-16: 80 mg via INTRAMUSCULAR

## 2014-08-16 MED ORDER — PREDNISONE 20 MG PO TABS: ORAL_TABLET | ORAL | Status: DC

## 2014-08-16 NOTE — Progress Notes (Addendum)
Subjective:     Patient ID: Destiny Sanchez, female   DOB: [birth year], 73 y.o.   MRN: [id number]  HPI 73 y/o WF here for a follow up visit...   ~  September 07, 2007:  She was last seen 2/07 by the nurse practitioner, and 7/06 by myself... she reports not feeling well for the last 2 weeks- she notes occas palpit despite totally stopping caffeine, and has some discomfort in her legs... she notes that she's been under alot of stress "my husb is a heart pt, and my sons have HBP & Chol (one son is a physician- [employer] in [place])...  ~  February 10, 2012:  Destiny Sanchez is here to re-establish after a 4.5 year hiatus & says that she wants blood work done;  Her mother passed away recently at age 61 w/ CHF;  She notes occas palpit but they are brief, infreq & resolve spont> she takes low dose BBlocker & avoids caffeine etc;  She has been followed by [facility] w/ Cardiac work up over the last few years as summarized below;  she saw DrTseui 4/13 for a lipoma on her back & may get this excised in the future...    We reviewed prob list, meds, xrays and labs> see below>> her son is an endocrinologist at [facility]... LABS 6/13:  FLP- ok on diet x LDL=108;  Chems- wnl;  CBC- wnl;  TSH=3.53...  ~  July 18, 2013:  79mo[place]Central[place] still c/o palpitations- skips, irregularity, not racing/ lightheaded, dizzy, etc;  She notes that these often occur w/ activity (eg. walking, playing golf, when hot) & may be assoc w/ SOB;  She last saw DrMcLean in 2011 for similar symptoms- at that time she had a 2DEcho (norm LVF & valves), and a Holter (PACs & short run atrial tachy- no worrisome arrhythmias);  They rec elim of caffeine & low dose BBlocker- Metoprolol25- 1/2Bid... She is still quite concerned about these palpitations and we discussed follow up w/ Cards- DrMcLean w/ poss repeat holter/ event recorder & poss treadmill test...     We reviewed prob list, meds, xrays and labs> see below for updates >> she had the 2014 Flu  vaccine 10/14...   EKG 11/14 showed NSR, rate70, sm Qs inferiorly, otherw NAD...   CXR 8/14 showed norm heart size, clear lungs, DJD in spine & osteopenia of bones...  LABS 8/14:  Chems- wnl;  CBC- wnl...   ~  August 16, 2104:  [place] [facility]/ cough, yellow sput, nasal drainage, & HA; she felt feverish w/ chills and her son ([facility]ocrine [facility] in a ZPak for her; the congestion has persisted & she is not resting;  She also reports a good bit of stress- [id number] & she had to do CPR, call 911, etc; he spent 2wks in hospw/ pacer/defib placed & he is improved- in rehab, playing golf again etc...     Glaucoma> on drops per ophthalmology & followed regularly...    AR, Bronchitis> Hx LPR w/ prev eval by DrByers, no longer taking PPI; uses OTC antihist as needed; presented 12/15 w/ URI/ AB=> treated w/ Zithromax, Depo80, Pred taper, Mucinex, Fluids...    AtypCP, Palpit> followed by DrMcLean w/ PAF found on Holter done for palpit; [place] 2014; [facility] reviewed    Borderline Chol> on diet  alone; FLP 12/15 showed TChol 173, TG 93, HDL 44, LDL 111; rec to continue diet & exercise...    GI- GERD, Divertics, IBS, colon polyps> followed by DrStark- prev on Aciphex; last colon was 11/12 w/ divertics and sm adenoma removed..    Migraine HAs> she had prev eval by DrAdelman, & accupuncture treatment by DrBloom; last treated w/ zoloft from DrFreeman...    Anxiety> under some stress but she's declined anxiolytic rx...     DERM- she has lipoma on her back assessed by [facility] & had Ca on  Nose removed by Derm... We reviewed prob list, meds, xrays and labs> see below for updates >>   CXR 12/15 showed norm heart size, clear lungs/ NAD, some hyperinflation suggested by radiology...  LABS 12/15:  FLP- ok w/ LDL=111;  Chems- wnl;  CBC- wnl;  TSH= 3.32...  PLAN>>   she has Bronchitis & AB exac- we will Rx w/ the Zithromax, Deop80, Pred-3d taper, Mucinex, fluids, etc, continue other meds the same...          Problem List:    GLAUCOMA (ICD-365.9) - [facility] by DrWhitaker, on gtts...  ALLERGIC RHINITIS (ICD-477.9) - has seen DrByers for ENT w/ LER... Uses OTC antihist as needed...  PALPITATIONS (ICD-785.1) >> on METOPROLOL 12m- 1/2 bid per DrMcLean... ~  normal 2DEcho 1993...  ~  negative Stress Cardiolite 2003 without ischemia and EF=75%... ~  Eval by Cards 2011- DrMcLean>  2DEcho & holter below- started on Metoprolol 253m 1/2 Bid & asked to avoid caffeine etc... ~  2DEcho 9/11 showed normal cavity size, normal sys function w/ EF= 60-65%, no regional wall motion problems, Gr1DD ~  Holter monitor 2011 showed PACs & short run (5beats) of atrial tachy... ~  2014: repeat Cards eval w/ neg stress echo but Holter showed several short runs of AFib; DrMcLean Rx w/ MetopER25Bid & XaEugenio Hoes. ~  2015: stable on Metoprolol ER 25Bid & XaEugenio Hoesshe sees DrMcLean Q6m44mostable...  CHEST PAIN, ATYPICAL (ICD-786.59) ~  CXR 7/11 showed normal heart size, clear lungs, mild osteopenia... ~  EKG 9/11 showed NSR, rate76, poss LAE, no STTWA, borderline tracing... ~  EKG 11/14 showed NSR, rate70, sm Qs inferiorly, otherw NAD... ~  Stress Echo 2014 was neg...   CHOLESTEROL >> on diet alone... ~  FLP 7/11 showed TChol 201, TG 62, HDL 60, LDL 127 ~  FLP 6/13 showed TChol 189, TG 62, HDL 69, LDL 108 ~  FLP 12/15 showed TChol 173, TG 93, HDL 44, LDL 111   GERD (ICD-530.81) - GI eval DrStark w/ GERD, LER, & Globus phenom... controlled w/ ACIPHEX which she prefers to the other PPIs... (Nexium & Omep without benefit)...  DIVERTICULOSIS >> IRRITABLE BOWEL SYNDROME >> COLON POLYPS >> colonoscopy 11/07 by DrStark showed 5mm92mlyp (tubular adenoma) f/u planned 5 yrs. ~  She had f/u colonoscopy 11/12 by DrStark w/ divertics & 2 sm polypd removed from transverse  colon; path= tubular adenoma & f/u planned 5 yrs.  Hx of MIGRAINE HEADACHE (ICD-346.90) - Hx myofascial pain as well w/ prev accupuncture therapy in 2003 by DrBloom... [facility]... ~  Now followed by DrFreeman she reports Rx for Prn Imitrex & ZOLOFT 50mg57m.  ANXIETY (ICD-300.00) ~  She had a good bit of stress 7/15 w/ her husb cardiac arrest, she did CPR & called 911, he spent 2wks in hosp but survived now w/ defib/pacer & in cardiac rehab...  Health Maintenance -  ~  GI:  Followed by DrStark  ~  GYN: DrDickstein in the past for PAP, Mammogram, BMD... ~  Immunizations:  She is encouraged to get the yearly Flu vaccine;  She had the Pneumovax several yrs ago;  She had TDAP 1/13;  We discussed the Shingles vaccine...   Past Medical History  Diagnosis Date  . Allergy   . Blood transfusion 1967 &1971  . Cancer     basal cell on nose  . GERD (gastroesophageal reflux disease)   . Glaucoma   . SVT (supraventricular tachycardia)   . Osteopenia   . Anxiety   . History of migraine   . IBS (irritable bowel syndrome)   . Hx of cardiovascular stress test     ETT-Echo (07/2013):  Normal.    Past Surgical History  Procedure Laterality Date  . Cataract extraction w/ intraocular lens  implant, bilateral    . Partial nephrectomy  1971    diverticulum on left  . Abdominal hysterectomy  1975  . Mohs surgery    . Hemorroidectomy  1973  . Abdominal hysterectomy      Outpatient Encounter Prescriptions as of 08/16/2014  Medication Sig  . brinzolamide (AZOPT) 1 % ophthalmic suspension Place 1 drop into both eyes 3 (three) times daily.    . Calcium Carbonate-Vitamin D (CALTRATE 600+D PO) Take 1,200 mg by mouth daily.    . Cholecalciferol (VITAMIN D) 2000 UNITS tablet Take 2,000 Units by mouth daily.    Marland Kitchen latanoprost (XALATAN) 0.005 % ophthalmic solution Place 1 drop into both eyes at bedtime.  . metoprolol succinate (TOPROL-XL) 25 MG 24 hr tablet TAKE ONE TABLET  BY MOUTH TWICE DAILY.  . multivitamin (THERAGRAN) per tablet Take 1 tablet by mouth daily.    . rivaroxaban (XARELTO) 20 MG TABS tablet TAKE ONE TABLET BY MOUTH ONCE DAILY WITH  SUPPER.    No Known Allergies   Current Medications, Allergies, Past Medical History, Past Surgical History, Family History, and Social History were reviewed in [employer] record.   Review of Systems         The patient complains of occas palpitations.  The patient denies fever, chills, sweats, anorexia, weakness, weight loss, sleep disorder, blurring, diplopia, eye irritation, eye discharge, vision loss, eye pain, photophobia, earache, ear discharge, tinnitus, decreased hearing, nasal congestion, nosebleeds, sore throat, hoarseness, chest pain, syncope, dyspnea on exertion, orthopnea, PND, peripheral edema, cough, dyspnea at rest, excessive sputum, hemoptysis, wheezing, pleurisy, nausea, vomiting, constipation, melena, hematochezia, jaundice, gas/bloating, indigestion/heartburn, dysphagia, odynophagia, dysuria, hematuria, urinary frequency, urinary hesitancy, nocturia, incontinence, back pain, joint pain, joint swelling, muscle weakness, stiffness, arthritis, sciatica, restless legs, leg pain at night, leg pain with exertion, rash, itching, dryness, suspicious lesions, paralysis, paresthesias, seizures, tremors, vertigo, transient blindness, frequent falls, difficulty walking, depression, anxiety, memory loss, confusion, cold intolerance, heat intolerance, polydipsia, polyphagia, polyuria, unusual weight change, abnormal bruising, bleeding, enlarged lymph nodes, urticaria, allergic rash, hay fever, and recurrent infections.  Note- all other systems neg except as noted...   Objective:   Physical Exam    WD, WN, 73 y/o WF in NAD... GENERAL:  Alert & oriented; pleasant & cooperative. HEENT:  [place]/AT, EOM-wnl, PERRLA, Fundi-benign, EACs-clear, TMs-wnl, NOSE-clear, THROAT-clear & wnl. NECK:  Supple w/  full ROM; no JVD; normal carotid impulses w/o bruits; no thyromegaly or nodules palpated; no lymphadenopathy. CHEST:  w/ exp wheezing & rhonchi, no signs of consolidation... HEART:  Regular Rhythm; without murmurs/ rubs/ or gallops. ABDOMEN:  Soft & nontender; normal bowel  sounds; no organomegaly or masses detected. EXT: without deformities or arthritic changes; no varicose veins/ venous insuffic/ or edema, pulses intact... NEURO:  CN's intact; motor testing normal; sensory testing normal; gait normal & balance OK. DERM:  No lesions noted; no rash etc...  RADIOLOGY DATA:  Reviewed in the EPIC EMR & discussed w/ the patient...  LABORATORY DATA:  Reviewed in the EPIC EMR & discussed w/ the patient...   Assessment:      Acute bronchitis w/ airway inflamm>  We discussed Rx w/ Zithromax, Pred, Mucinex Fluids...  Glaucoma>  On [facility] drops [facility]Whitaker [facility]...  AR>  She uses OTC Antihist etc as needed...  Hx AtypCP, Palpit>  Followed by DrMcLean & on Metoprolol 19m- 1/2 Bid w/ continued symptoms; we discussed incr to 1 tab Bid & f/u Cards...  Borderline Chol>  LDL was 111, on diet + exercise...  GERD>  Reminded to take PPI vs H2Blocker as needed for symptoms...  Divertics, IBS, Polyps>  Followed by DrStark & up to date on colonoscopy...  Hx Migraine HAs>  She has Imitrex for prn use from DrFreeman...  Hx Anxiety> she takes Zoloft from [employer]..     Plan:     Patient's Medications  New Prescriptions   PREDNISONE (DELTASONE) 20 MG TABLET    Take as directed.  Previous Medications   BRINZOLAMIDE (AZOPT) 1 % OPHTHALMIC SUSPENSION    Place 1 drop into both eyes 3 (three) times daily.     CALCIUM CARBONATE-VITAMIN D (CALTRATE 600+D PO)    Take 1,200 mg by mouth daily.     CHOLECALCIFEROL (VITAMIN D) 2000 UNITS TABLET    Take 2,000 Units by mouth daily.     LATANOPROST (XALATAN) 0.005 % OPHTHALMIC SOLUTION    Place 1 drop into both eyes at bedtime.   METOPROLOL SUCCINATE  (TOPROL-XL) 25 MG 24 HR TABLET    TAKE ONE TABLET BY MOUTH TWICE DAILY.   MULTIVITAMIN (THERAGRAN) PER TABLET    Take 1 tablet by mouth daily.     RIVAROXABAN (XARELTO) 20 MG TABS TABLET    TAKE ONE TABLET BY MOUTH ONCE DAILY WITH  SUPPER.  Modified Medications   No medications on file  Discontinued Medications   No medications on file

## 2014-08-16 NOTE — Patient Instructions (Signed)
Today we updated your med list in our EPIC system...    Continue your current medications the same...  For your asthmatic bronchitis >>    Elizebeth Koller the current Zithromax Rx...    We gave you a Depomedrol shot today...    Starting tomorrow> take the new Rx for PREDNISONE 20mg  tabs:       Start w/ one tab twice daily for 3 days,       Then take one tab each AM for 3 days,        Then take 1/2 tab each AM for 3 days,        Then take 1/2 tab every other day til gone...  You may use OTC MUCINEX 600mg  up to 4 times daily w/ lots of fluids for the thick phlegm...    And if needed use the OTC DELSYM cough syrup (2tsp every 12h)...  Call for any questions...   Today we did your follow up CXR & FASTING blood work...    We will contact you w/ the results when available...   Please give [place] a call in early Jan2016 so we can give you the 2015 Flu vaccine (and reminder for the PREVNAR-13 pneumonia shot as well)...  Let's plan a follow up visit in 109yr, sooner if needed for problems.Marland KitchenMarland Kitchen

## 2014-08-19 ENCOUNTER — Other Ambulatory Visit: Payer: Managed Care, Other (non HMO)

## 2014-08-19 DIAGNOSIS — E079 Disorder of thyroid, unspecified: Secondary | ICD-10-CM

## 2014-08-20 LAB — TSH: TSH: 3.316 u[IU]/mL (ref 0.350–4.500)

## 2014-09-12 ENCOUNTER — Telehealth: Payer: Self-pay | Admitting: Pulmonary Disease

## 2014-09-12 NOTE — Telephone Encounter (Signed)
Pt would like to get the flu shot and Prevnar. SN - please advise if this would be okay. Thanks.

## 2014-09-13 ENCOUNTER — Ambulatory Visit (INDEPENDENT_AMBULATORY_CARE_PROVIDER_SITE_OTHER): Payer: Managed Care, Other (non HMO)

## 2014-09-13 DIAGNOSIS — Z23 Encounter for immunization: Secondary | ICD-10-CM

## 2014-09-13 NOTE — Telephone Encounter (Signed)
Per SN--  Ok for her to get the flu vaccine, but she will have to wait to get the prevnar 13 since these two cannot be given at the same time.  thanks

## 2014-09-13 NOTE — Telephone Encounter (Signed)
Pt is aware and will come for flu shot only today. Pt will call back to schedule prevnar injection. Nothing more needed at this time.

## 2014-10-14 ENCOUNTER — Ambulatory Visit: Payer: Managed Care, Other (non HMO)

## 2014-10-15 ENCOUNTER — Ambulatory Visit (INDEPENDENT_AMBULATORY_CARE_PROVIDER_SITE_OTHER): Payer: Managed Care, Other (non HMO)

## 2014-10-15 DIAGNOSIS — Z23 Encounter for immunization: Secondary | ICD-10-CM

## 2014-11-28 ENCOUNTER — Ambulatory Visit (INDEPENDENT_AMBULATORY_CARE_PROVIDER_SITE_OTHER): Payer: Medicare HMO | Admitting: Cardiology

## 2014-11-28 ENCOUNTER — Encounter: Payer: Self-pay | Admitting: Cardiology

## 2014-11-28 VITALS — BP 120/72 | HR 70 | Ht 66.0 in | Wt 129.0 lb

## 2014-11-28 DIAGNOSIS — R002 Palpitations: Secondary | ICD-10-CM

## 2014-11-28 DIAGNOSIS — I480 Paroxysmal atrial fibrillation: Secondary | ICD-10-CM | POA: Diagnosis not present

## 2014-11-28 MED ORDER — METOPROLOL SUCCINATE ER 25 MG PO TB24: ORAL_TABLET | ORAL | Status: DC

## 2014-11-28 MED ORDER — RIVAROXABAN 20 MG PO TABS: ORAL_TABLET | ORAL | Status: DC

## 2014-11-28 NOTE — Patient Instructions (Addendum)
Your physician recommends that you return for lab work in [place] is scheduled at the [facility] on [facility]  February 06, 2015--the lab is open from 7:30AM-5PM.  Your physician wants you to follow-up with Dr Aundra Dubin in 9 months. (December 2016).  You will receive a reminder letter in the mail two months in advance. If you don't receive a letter, please call our office to schedule the follow-up appointment.

## 2014-11-28 NOTE — Progress Notes (Signed)
Patient ID: Destiny Sanchez, female   DOB: 12-03-1940, 74 y.o.   MRN: [id number] PCP:  Noralee Space, MD   Destiny Sanchez is a 74 y.o. female with a history of anxiety, migraines, HL, GERD, palpitations, glaucoma, and paroxysmal atrial fibrillation.  She saw Richardson Dopp in 11/14 and was set up for stress echo and event monitor.  The stress echo was normal.  The event monitor was concerning for several episodes of atrial fibrillation (short runs).  No history of GI bleeding.  She was started on Xarelto 20 mg daily and metoprolol was transitioned to Toprol XL.    She has occasional palpitations, none long-lasting.  She is in NSR today.  No chest pain.  She gets a fair amount of exercise.  Mild dyspnea walking up a hill has been chronic.  No BRBPR or melena.  Labs (8/14): K 3.9, creatinine 0.6 Labs (11/14): TSH normal Labs (1/15): K 4, creatinine 0.5, HCT 38.1 Labs (3/15): K 4, creatinine 0.5, HCT 37.8 Labs (12/15): K 3.9, creatinine 0.5, LDL 111, HDL 44, TSH normal, HCT 40.8  ECG: NSR, normal  Wt Readings from Last 3 Encounters:  11/28/14 129 lb (58.514 kg)  08/16/14 129 lb 6.4 oz (58.695 kg)  05/14/14 126 lb 6.4 oz (57.335 kg)    Past Medical History:  1. GLAUCOMA 2. ALLERGIC RHINITIS: has seen Dr Janace Hoard for ENT w/ LER  3. Atrial fibrillation: normal 2D Echo 1993. Echo (9/11): EF 44%, mild diastolic dysfunction, no significant valvular dysfunction, normal RV, PA systolic pressure 31 mmHg. Holter monitor (9/11) with PACs and a short run (5 beats) of atrial tachycardia. No worrisome arrhythmias.  Event monitor (12/14) with several runs of suspected atrial fibrillation.  4. Atypical chest pain: negative Stress Cardiolite 2003 without ischemia and EF=75%.  Stress echo (12/14) with EF 65%, no ischemia or infarction.  5. GERD:  GI eval Dr Fuller Plan w/ GERD, LER, & Globus phenomenon controlled w/ ACIPHEX which she prefers to the other PPIs (Nexium & Omep without benefit). 6. IRRITABLE BOWEL  SYNDROME:  last colonoscopy 11/07 by DrStark showed 27mm polyp (tubular adenoma) f/u planned 5 yrs.  7. Hx of MIGRAINE HEADACHE: Hx myofascial pain as well w/ prev accupuncture therapy in 2003 by Dr Gust Rung... prev headache eval in the [place] clinic...  8. ANXIETY  Current Outpatient Prescriptions  Medication Sig Dispense Refill  . brinzolamide (AZOPT) 1 % ophthalmic suspension 1 drop 3 (three) times daily.    . Calcium Carbonate-Vitamin D (CALTRATE 600+D PO) Take 1,200 mg by mouth daily.      . Cholecalciferol (VITAMIN D) 2000 UNITS tablet Take 2,000 Units by mouth daily.      Marland Kitchen ibandronate (BONIVA) 150 MG tablet Take 150 mg by mouth every 30 (thirty) days. Take in the morning with a full glass of water, on an empty stomach, and do not take anything else by mouth or lie down for the next 30 min.    . latanoprost (XALATAN) 0.005 % ophthalmic solution Place 1 drop into both eyes at bedtime.    . metoprolol succinate (TOPROL-XL) 25 MG 24 hr tablet TAKE ONE TABLET BY MOUTH TWICE DAILY. 180 tablet 3  . multivitamin (THERAGRAN) per tablet Take 1 tablet by mouth daily.      . rivaroxaban (XARELTO) 20 MG TABS tablet TAKE ONE TABLET BY MOUTH ONCE DAILY WITH  SUPPER. 90 tablet 3   No current facility-administered medications for this visit.    Allergies:   Review of patient's allergies  indicates no known allergies.   Social History:  The patient  reports that she quit smoking about 52 years ago. Her smoking use included Cigarettes. She has a 10 pack-year smoking history. She has never used smokeless tobacco. She reports that she drinks about 1.8 oz of alcohol per week. She reports that she does not use illicit drugs.   Family History:  The patient's family history includes Colon cancer in her maternal aunt; Throat cancer in her father.   PHYSICAL EXAM: VS:  BP 120/72 mmHg  Pulse 70  Ht 5\' 6"  (1.676 m)  Wt 129 lb (58.514 kg)  BMI 20.83 kg/m2 Well nourished, well developed, in no acute  distress HEENT: normal Neck: no JVD Endocrine: No thyromegaly Cardiac:  normal S1, S2; RRR; no murmur Lungs:  clear to auscultation bilaterally, no wheezing, rhonchi or rales Abd: soft, nontender, no hepatomegaly Ext: no edema Skin: warm and dry  Assessment/Plan: 1. Chest pain: Negative stress echo in 2014.  Suspect noncardiac chest pain, no recurrence.  2. Atrial fibrillation: Paroxysmal.  Noted by event monitor.  She has symptomatic tachypalpitations when she goes into atrial fibrillation, nothing recently.  - CHADSVASC = 3, no history of bleeding.  She is on Xarelto 20 mg daily and will continue. - She is doing well on Toprol XL, continue current dose.  - Check BMET/CBC in 6/16.   Loralie Champagne 11/28/2014

## 2015-02-06 ENCOUNTER — Other Ambulatory Visit (INDEPENDENT_AMBULATORY_CARE_PROVIDER_SITE_OTHER): Payer: Medicare HMO

## 2015-02-06 ENCOUNTER — Other Ambulatory Visit: Payer: Medicare HMO

## 2015-02-06 DIAGNOSIS — I480 Paroxysmal atrial fibrillation: Secondary | ICD-10-CM | POA: Diagnosis not present

## 2015-02-06 LAB — BASIC METABOLIC PANEL
BUN: 24 mg/dL — AB (ref 6–23)
CHLORIDE: 104 meq/L (ref 96–112)
CO2: 28 mEq/L (ref 19–32)
Calcium: 9.2 mg/dL (ref 8.4–10.5)
Creatinine, Ser: 0.61 mg/dL (ref 0.40–1.20)
GFR: 101.3 mL/min (ref >60.00)
Glucose, Bld: 87 mg/dL (ref 70–99)
Potassium: 3.8 mEq/L (ref 3.5–5.1)
SODIUM: 137 meq/L (ref 135–145)

## 2015-02-06 LAB — CBC WITH DIFFERENTIAL/PLATELET
Basophils Absolute: 0.1 10*3/uL (ref 0.0–0.1)
Basophils Relative: 1 % (ref 0.0–3.0)
EOS PCT: 4.5 % (ref 0.0–5.0)
Eosinophils Absolute: 0.2 10*3/uL (ref 0.0–0.7)
HEMATOCRIT: 43.2 % (ref 36.0–46.0)
Hemoglobin: 14.3 g/dL (ref 12.0–15.0)
Lymphocytes Relative: 24.7 % (ref 12.0–46.0)
Lymphs Abs: 1.3 10*3/uL (ref 0.7–4.0)
MCHC: 33.1 g/dL (ref 30.0–36.0)
MCV: 96.3 fl (ref 78.0–100.0)
MONOS PCT: 7.6 % (ref 3.0–12.0)
Monocytes Absolute: 0.4 10*3/uL (ref 0.1–1.0)
NEUTROS PCT: 62.2 % (ref 43.0–77.0)
Neutro Abs: 3.3 10*3/uL (ref 1.4–7.7)
Platelets: 436 10*3/uL — ABNORMAL HIGH (ref 150.0–400.0)
RBC: 4.48 Mil/uL (ref 3.87–5.11)
RDW: 13.6 % (ref 11.5–15.5)
WBC: 5.3 10*3/uL (ref 4.0–10.5)

## 2015-02-28 ENCOUNTER — Encounter: Payer: Self-pay | Admitting: Gastroenterology

## 2015-06-17 DIAGNOSIS — H401131 Primary open-angle glaucoma, bilateral, mild stage: Secondary | ICD-10-CM | POA: Diagnosis not present

## 2015-07-18 DIAGNOSIS — H401122 Primary open-angle glaucoma, left eye, moderate stage: Secondary | ICD-10-CM | POA: Diagnosis not present

## 2015-07-18 DIAGNOSIS — H401111 Primary open-angle glaucoma, right eye, mild stage: Secondary | ICD-10-CM | POA: Diagnosis not present

## 2015-07-30 IMAGING — CR DG CHEST 2V
2 series · 2 of 2 positions shown · non-contrast
Comparison: Two-view chest 04/17/2013.

CLINICAL DATA: Upper respiratory infection.  Bronchitis.

EXAM:
CHEST  2 VIEW

[view not recorded (1 of 2)]
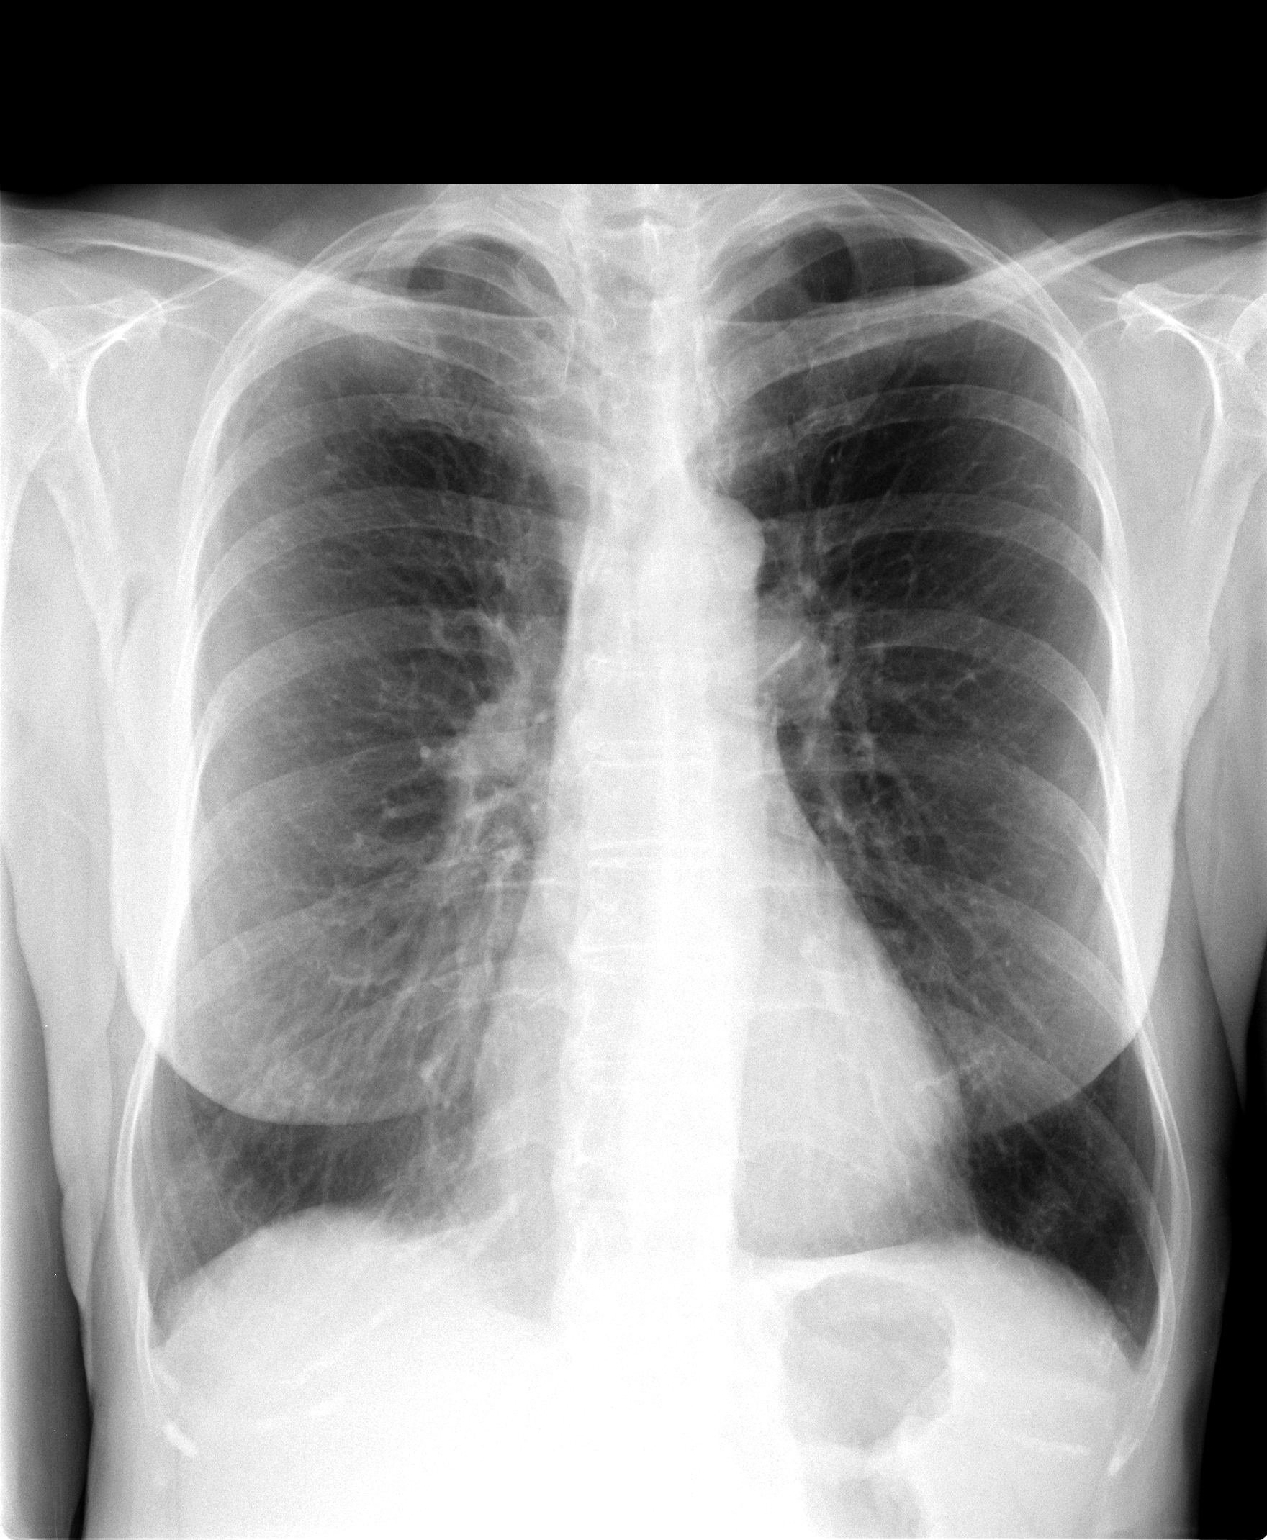

[view not recorded (2 of 2)]
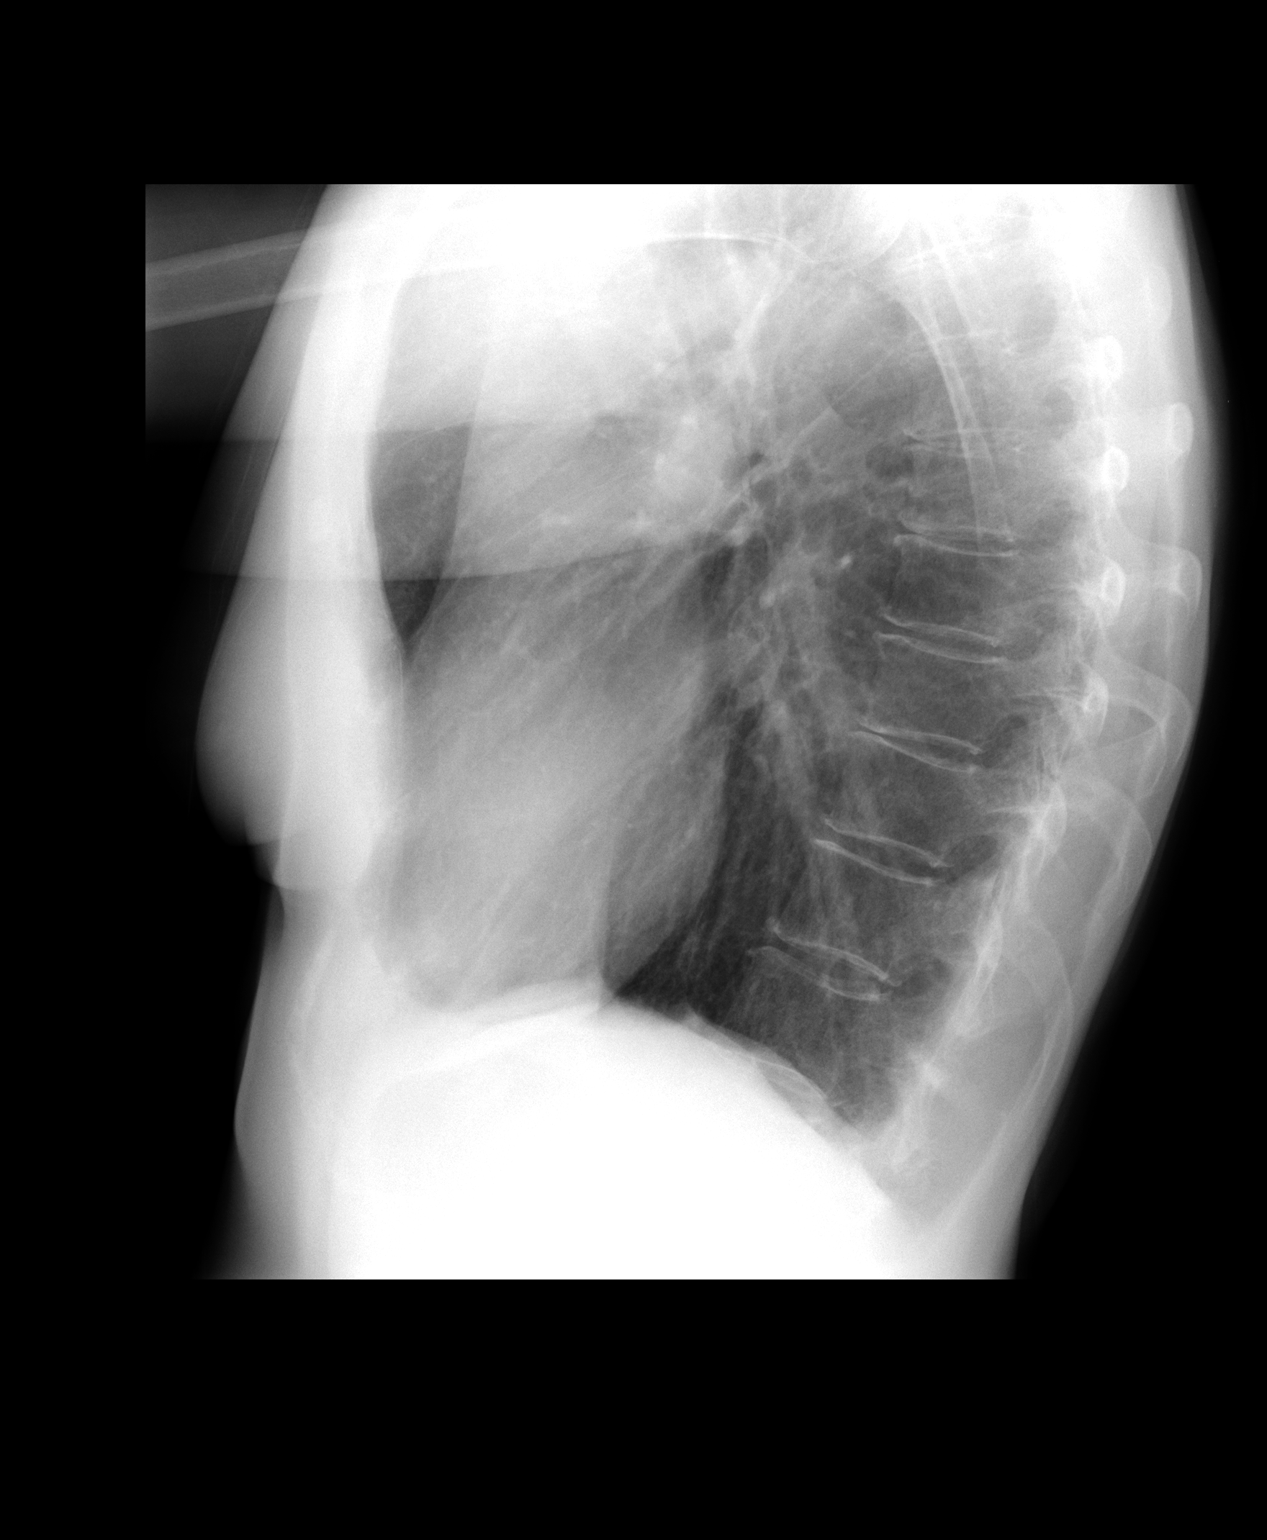

[2 of 2 positions shown; findings below may reference images not displayed]

FINDINGS: The heart size is normal. Chronic hyperinflation is similar to the
prior studies. No focal airspace disease is evident. The visualized
soft tissues and bony thorax are unremarkable.
IMPRESSION: 1. No acute cardiopulmonary disease.
2. Stable chronic hyperinflation.

## 2015-08-06 ENCOUNTER — Ambulatory Visit (INDEPENDENT_AMBULATORY_CARE_PROVIDER_SITE_OTHER): Payer: Medicare HMO | Admitting: Pulmonary Disease

## 2015-08-06 ENCOUNTER — Encounter: Payer: Self-pay | Admitting: Pulmonary Disease

## 2015-08-06 VITALS — BP 116/70 | HR 77 | Temp 98.0°F | Wt 127.0 lb

## 2015-08-06 DIAGNOSIS — J301 Allergic rhinitis due to pollen: Secondary | ICD-10-CM

## 2015-08-06 DIAGNOSIS — Z860100 Personal history of colon polyps, unspecified: Secondary | ICD-10-CM

## 2015-08-06 DIAGNOSIS — F411 Generalized anxiety disorder: Secondary | ICD-10-CM

## 2015-08-06 DIAGNOSIS — K588 Other irritable bowel syndrome: Secondary | ICD-10-CM

## 2015-08-06 DIAGNOSIS — K219 Gastro-esophageal reflux disease without esophagitis: Secondary | ICD-10-CM

## 2015-08-06 DIAGNOSIS — M25512 Pain in left shoulder: Secondary | ICD-10-CM

## 2015-08-06 DIAGNOSIS — Z23 Encounter for immunization: Secondary | ICD-10-CM | POA: Diagnosis not present

## 2015-08-06 DIAGNOSIS — I480 Paroxysmal atrial fibrillation: Secondary | ICD-10-CM

## 2015-08-06 DIAGNOSIS — G43009 Migraine without aura, not intractable, without status migrainosus: Secondary | ICD-10-CM

## 2015-08-06 DIAGNOSIS — K5730 Diverticulosis of large intestine without perforation or abscess without bleeding: Secondary | ICD-10-CM

## 2015-08-06 MED ORDER — ALPRAZOLAM 0.5 MG PO TABS: 0.5000 mg | ORAL_TABLET | Freq: Three times a day (TID) | ORAL | Status: DC | PRN

## 2015-08-06 NOTE — Progress Notes (Signed)
Subjective:     Patient ID: Destiny Sanchez, female   DOB: 12/22/1940, 74 y.o.   MRN: [id number]  HPI 74 y/o WF here for a follow up visit... SEE PREV EPIC NOTES FOR OLDER DATA>>    LABS 6/13:  FLP- ok on diet x LDL=108;  Chems- wnl;  CBC- wnl;  TSH=3.53...  ~  July 18, 2013:  24mo [place] is still c/o palpitations- skips, irregularity, not racing/ lightheaded, dizzy, etc;  She notes that these often occur w/ activity (eg. walking, playing golf, when hot) & may be assoc w/ SOB;  She last saw DrMcLean in 2011 for similar symptoms- at that time she had a 2DEcho (norm LVF & valves), and a Holter (PACs & short run atrial tachy- no worrisome arrhythmias);  They rec elim of caffeine & low dose BBlocker- Metoprolol25- 1/2Bid... She is still quite concerned about these palpitations and we discussed follow up w/ Cards- DrMcLean w/ poss repeat holter/ event recorder & poss treadmill test...     We reviewed prob list, meds, xrays and labs> see below for updates >> she had the 2014 Flu vaccine 10/14...   EKG 11/14 showed NSR, rate70, sm Qs inferiorly, otherw NAD...   CXR 8/14 showed norm heart size, clear lungs, DJD in spine & osteopenia of bones...  LABS 8/14:  Chems- wnl;  CBC- wnl...   ~  August 16, 2104:  17mo Destiny Sanchez just ret from [facility] w/ cough, yellow sput, nasal drainage, & HA; she felt feverish w/ chills and her son (Endocrine at [facility]) called in a ZPak for her; the congestion has persisted & she is not resting;  She also reports a good bit of stress- in [id number] shortly after returning from trip to [place] her husb collapsed & she had to do CPR, call 911, etc; he spent 2wks in hospw/ pacer/defib placed & he is improved- in rehab, playing golf again etc...     Glaucoma> on drops per ophthalmology & followed regularly...    AR, Bronchitis> Hx LPR w/ prev eval by DrByers, no longer taking PPI; uses OTC antihist as needed; presented 12/15 w/ URI/ AB=> treated w/ Zithromax, Depo80,  Pred taper, Mucinex, Fluids...    AtypCP, Palpit> followed by DrMcLean w/ PAF found on Holter done for palpit; on [place]; she had neg stress echo 2014; seen by [facility] 9/15- note reviewed    Borderline Chol> on diet alone; FLP 12/15 showed TChol 173, TG 93, HDL 44, LDL 111; rec to continue diet & exercise...    GI- GERD, Divertics, IBS, colon polyps> followed by DrStark- prev on Aciphex; last colon was 11/12 w/ divertics and sm adenoma removed..    Migraine HAs> she had prev eval by DrAdelman, & accupuncture treatment by DrBloom; last treated w/ zoloft from DrFreeman...    Anxiety> under some stress but she's declined anxiolytic rx...     DERM- she has lipoma on her back assessed by [facility] & had Ca on  Nose removed by Derm... We reviewed prob list, meds, xrays and labs> see below for updates >>   CXR 12/15 showed norm heart size, clear lungs/ NAD, some hyperinflation suggested by radiology...  LABS 12/15:  FLP- ok w/ LDL=111;  Chems- wnl;  CBC- wnl;  TSH= 3.32...  PLAN>>  she has Bronchitis & AB exac- we will Rx w/ the Zithromax, Deop80, Pred-3d taper, Mucinex, fluids, etc, continue other meds the same...  ~  August 06, 2015:  Yearly ROV &  Destiny Sanchez reports a good year- no new complaints or concerns; her husb had sudden cardiac death at home & she did CPR, he was in a coma for 6-7d and is now doing remarkably well w/ an implanted defib now;  She has experienced some anxiety & we discussed Rx w/ alpraz 0.$RemoveBef'5mg'wTAxdxbspK$  1/2 to 1 tab Tid prn;  She also notes some left shoulder pain & we will set up an Ortho consult w/ DrWainer Harriett Sine...  We reviewed the following medical problems during today's office visit >> NOTE: one son has celiac dis and one son has DM.    Glaucoma> on drops per ophthalmology & followed regularly...    AR, Bronchitis> Hx LPR w/ prev eval by DrByers, no longer taking PPI; uses OTC antihist as needed; presented 12/15 w/ URI/ AB=> treated w/ Zithromax, Depo80, Pred taper, Mucinex,  Fluids...    AtypCP, Palpit> followed by DrMcLean w/ PAF found on Holter done for palpit; on [place]; she had neg stress echo 2014; seen by [facility] 3/16- note reviewed    Borderline Chol> on diet alone; FLP 12/16 showed TChol 176, TG 86, HDL 52, LDL 107; rec to continue diet & exercise...    GI- GERD, Divertics, IBS, colon polyps> followed by DrStark- prev on Aciphex; last colon was 11/12 w/ divertics and sm adenoma removed..    Migraine HAs> she had prev eval by DrAdelman, & accupuncture treatment by DrBloom; last treated w/ zoloft from DrFreeman...    Anxiety> under some stress but she's declined anxiolytic rx...     DERM- she has lipoma on her back assessed by [facility] & had Ca on nose removed by Derm... EXAM shows Afeb, VSS, O2sat=99% on RA;  HEENT- neg, mallampati2;  Chest- clear w/o w/r/r;  Heart- RR s4 w/o m/r;  Abd- soft, nontender, neg;  Ext- L shoulder pain, w/o c/c/e;  Neuro- intact...  LABS 08/05/16>  FLP- ok on diet alone w/ LDL=107;  Chems- wnl;  CBC- wnl;  TSH=4.69;  VitD=37 IMP/PLAN>>  Destiny Sanchez is doing satis- she has some anxiety & we dicussed Rx w/ Alpraz 0.$RemoveBef'5mg'dQZTWPqgxM$  prn (he had a traumatic event w/ husb's SCD but she saved his life w/ CPR & her quick action; we gave her the 2016 FLU vaccine today; we will arrange for Ortho consult for left shoulder pain...           Problem List:    GLAUCOMA (ICD-365.9) - followed at [facility] by DrWhitaker, on gtts...  ALLERGIC RHINITIS (ICD-477.9) - has seen DrByers for ENT w/ LER... Uses OTC antihist as needed...  PALPITATIONS (ICD-785.1) >> on METOPROLOL $RemoveBefor'25mg'xWRdwRiQpfPa$ - 1/2 bid per DrMcLean... ~  normal 2DEcho 1993...  ~  negative Stress Cardiolite 2003 without ischemia and EF=75%... ~  Eval by Cards 2011- DrMcLean>  2DEcho & holter below- started on Metoprolol $RemoveBefor'25mg'HsEbHEgWmDkf$ - 1/2 Bid & asked to avoid caffeine etc... ~  2DEcho 9/11 showed normal cavity size, normal sys function w/ EF= 60-65%, no regional wall motion problems, Gr1DD ~  Holter monitor 2011  showed PACs & short run (5beats) of atrial tachy... ~  2014: repeat Cards eval w/ neg stress echo but Holter showed several short runs of AFib; DrMcLean Rx w/ MetopER25Bid & Eugenio Hoes... ~  2015: stable on Metoprolol ER 25Bid & Eugenio Hoes; she sees DrMcLean Q50mo & stable...  CHEST PAIN, ATYPICAL (ICD-786.59) ~  CXR 7/11 showed normal heart size, clear lungs, mild osteopenia... ~  EKG 9/11 showed NSR, rate76, poss LAE, no STTWA, borderline tracing... ~  EKG 11/14  showed NSR, rate70, sm Qs inferiorly, otherw NAD... ~  Stress Echo 2014 was neg...   CHOLESTEROL >> on diet alone... ~  FLP 7/11 showed TChol 201, TG 62, HDL 60, LDL 127 ~  FLP 6/13 showed TChol 189, TG 62, HDL 69, LDL 108 ~  FLP 12/15 showed TChol 173, TG 93, HDL 44, LDL 111   GERD (ICD-530.81) - GI eval DrStark w/ GERD, LER, & Globus phenom... controlled w/ ACIPHEX which she prefers to the other PPIs... (Nexium & Omep without benefit)...  DIVERTICULOSIS >> IRRITABLE BOWEL SYNDROME >> COLON POLYPS >> colonoscopy 11/07 by DrStark showed 24mm polyp (tubular adenoma) f/u planned 5 yrs. ~  She had f/u colonoscopy 11/12 by DrStark w/ divertics & 2 sm polypd removed from transverse colon; path= tubular adenoma & f/u planned 5 yrs.  Hx of MIGRAINE HEADACHE (ICD-346.90) - Hx myofascial pain as well w/ prev accupuncture therapy in 2003 by DrBloom... prev headache eval in the [place] [facility]... ~  Now followed by DrFreeman she reports Rx for Prn Imitrex & ZOLOFT $Remove'50mg'FAsWaKp$ /d...  ANXIETY (ICD-300.00) ~  She had a good bit of stress 7/15 w/ her husb cardiac arrest, she did CPR & called 911, he spent 2wks in hosp but survived now w/ defib/pacer & in cardiac rehab...  Health Maintenance -  ~  GI:  Followed by DrStark  ~  GYN: DrDickstein in the past for PAP, Mammogram, BMD... ~  Immunizations:  She is encouraged to get the yearly Flu vaccine;  She had the Pneumovax several yrs ago;  She had TDAP 1/13;  We discussed the Shingles vaccine...   Past  Medical History  Diagnosis Date  . Allergy   . Blood transfusion 1967 &1971  . Cancer ([place])     basal cell on nose  . GERD (gastroesophageal reflux disease)   . Glaucoma   . SVT (supraventricular tachycardia) ([place])   . Osteopenia   . Anxiety   . History of migraine   . IBS (irritable bowel syndrome)   . Hx of cardiovascular stress test     ETT-Echo (07/2013):  Normal.    Past Surgical History  Procedure Laterality Date  . Cataract extraction w/ intraocular lens  implant, bilateral    . Partial nephrectomy  1971    diverticulum on left  . Abdominal hysterectomy  1975  . Mohs surgery    . Hemorroidectomy  1973  . Abdominal hysterectomy      Outpatient Encounter Prescriptions as of 08/06/2015  Medication Sig  . brinzolamide (AZOPT) 1 % ophthalmic suspension 1 drop 3 (three) times daily.  . Calcium Carbonate-Vitamin D (CALTRATE 600+D PO) Take 1,200 mg by mouth daily.    . Cholecalciferol (VITAMIN D) 2000 UNITS tablet Take 2,000 Units by mouth daily.    Marland Kitchen ibandronate (BONIVA) 150 MG tablet Take 150 mg by mouth every 30 (thirty) days. Take in the morning with a full glass of water, on an empty stomach, and do not take anything else by mouth or lie down for the next 30 min.  . latanoprost (XALATAN) 0.005 % ophthalmic solution Place 1 drop into both eyes at bedtime.  . metoprolol succinate (TOPROL-XL) 25 MG 24 hr tablet TAKE ONE TABLET BY MOUTH TWICE DAILY.  . multivitamin (THERAGRAN) per tablet Take 1 tablet by mouth daily.    . rivaroxaban (XARELTO) 20 MG TABS tablet TAKE ONE TABLET BY MOUTH ONCE DAILY WITH  SUPPER.  Marland Kitchen ALPRAZolam (XANAX) 0.5 MG tablet Take 1 tablet (0.5 mg total)  by mouth 3 (three) times daily as needed for anxiety.   No facility-administered encounter medications on file as of 08/06/2015.    No Known Allergies   Current Medications, Allergies, Past Medical History, Past Surgical History, Family History, and Social History were reviewed in [employer] record.   Review of Systems         The patient complains of occas palpitations.  The patient denies fever, chills, sweats, anorexia, weakness, weight loss, sleep disorder, blurring, diplopia, eye irritation, eye discharge, vision loss, eye pain, photophobia, earache, ear discharge, tinnitus, decreased hearing, nasal congestion, nosebleeds, sore throat, hoarseness, chest pain, syncope, dyspnea on exertion, orthopnea, PND, peripheral edema, cough, dyspnea at rest, excessive sputum, hemoptysis, wheezing, pleurisy, nausea, vomiting, constipation, melena, hematochezia, jaundice, gas/bloating, indigestion/heartburn, dysphagia, odynophagia, dysuria, hematuria, urinary frequency, urinary hesitancy, nocturia, incontinence, back pain, joint pain, joint swelling, muscle weakness, stiffness, arthritis, sciatica, restless legs, leg pain at night, leg pain with exertion, rash, itching, dryness, suspicious lesions, paralysis, paresthesias, seizures, tremors, vertigo, transient blindness, frequent falls, difficulty walking, depression, anxiety, memory loss, confusion, cold intolerance, heat intolerance, polydipsia, polyphagia, polyuria, unusual weight change, abnormal bruising, bleeding, enlarged lymph nodes, urticaria, allergic rash, hay fever, and recurrent infections.  Note- all other systems neg except as noted...   Objective:   Physical Exam    WD, WN, 74 y/o WF in NAD... GENERAL:  Alert & oriented; pleasant & cooperative. HEENT:  /AT, EOM-wnl, PERRLA, Fundi-benign, EACs-clear, TMs-wnl, NOSE-clear, THROAT-clear & wnl. NECK:  Supple w/ full ROM; no JVD; normal carotid impulses w/o bruits; no thyromegaly or nodules palpated; no lymphadenopathy. CHEST:  Clear w/o wheezing/ rales/ rhonchi... HEART:  Regular Rhythm; without murmurs/ rubs/ or gallops. ABDOMEN:  Soft & nontender; normal bowel sounds; no organomegaly or masses detected. EXT: without deformities or arthritic changes; no varicose  veins/ venous insuffic/ or edema, pulses intact... NEURO:  CN's intact; motor testing normal; sensory testing normal; gait normal & balance OK. DERM:  No lesions noted; no rash etc...  RADIOLOGY DATA:  Reviewed in the EPIC EMR & discussed w/ the patient...  LABORATORY DATA:  Reviewed in the EPIC EMR & discussed w/ the patient...   Assessment:      Hx Acute bronchitis w/ airway inflamm>  Prev treated w/ Zithromax, Pred, Mucinex Fluids...  Glaucoma>  [facility] drops [facility]...  AR>  She uses OTC Antihist etc as needed...  Hx AtypCP, Palpit>  Followed by DrMcLean & on Metoprolol $RemoveBefor'25mg'SBBwzNiAcsUA$ - 1/2 Bid w/ continued symptoms; we discussed incr to 1 tab Bid & f/u Cards...  Borderline Chol>  LDL was 111, on diet + exercise...  GERD>  Reminded to take PPI vs H2Blocker as needed for symptoms...  Divertics, IBS, Polyps>  Followed by DrStark & up to date on colonoscopy...  Hx Migraine HAs>  She has Imitrex for prn use from DrFreeman...  Hx Anxiety> she takes Zoloft from [employer]...     Plan:     Patient's Medications  New Prescriptions   ALPRAZOLAM (XANAX) 0.5 MG TABLET    Take 1 tablet (0.5 mg total) by mouth 3 (three) times daily as needed for anxiety.  Previous Medications   BRINZOLAMIDE (AZOPT) 1 % OPHTHALMIC SUSPENSION    1 drop 3 (three) times daily.   CALCIUM CARBONATE-VITAMIN D (CALTRATE 600+D PO)    Take 1,200 mg by mouth daily.     CHOLECALCIFEROL (VITAMIN D) 2000 UNITS TABLET    Take 2,000 Units by mouth daily.  IBANDRONATE (BONIVA) 150 MG TABLET    Take 150 mg by mouth every 30 (thirty) days. Take in the morning with a full glass of water, on an empty stomach, and do not take anything else by mouth or lie down for the next 30 min.   LATANOPROST (XALATAN) 0.005 % OPHTHALMIC SOLUTION    Place 1 drop into both eyes at bedtime.   METOPROLOL SUCCINATE (TOPROL-XL) 25 MG 24 HR TABLET    TAKE ONE TABLET BY MOUTH TWICE DAILY.   MULTIVITAMIN (THERAGRAN) PER TABLET    Take 1  tablet by mouth daily.     RIVAROXABAN (XARELTO) 20 MG TABS TABLET    TAKE ONE TABLET BY MOUTH ONCE DAILY WITH  SUPPER.  Modified Medications   No medications on file  Discontinued Medications   No medications on file

## 2015-08-06 NOTE — Patient Instructions (Signed)
Destiny Sanchez-- it was great seeing you again...  We discussed trying a low dose anxiolytic medication like ALPRAZOLAM 0.5mg - 1/2 to 1 tab up to 3 times daily as needed for nerves...  Please return to our lab one morning this week for your FASTING blood work...    We will contact you w/ the results when available...   We gave you the 2016 Flu vaccine today...  We will arrange for a referral to DrWainer et al to check your left shoulder...  Call for any questions...  Let's plan a follow up visit in 39yr, sooner if needed for problems.Marland KitchenMarland Kitchen

## 2015-08-07 ENCOUNTER — Other Ambulatory Visit (INDEPENDENT_AMBULATORY_CARE_PROVIDER_SITE_OTHER): Payer: Medicare HMO

## 2015-08-07 DIAGNOSIS — M7542 Impingement syndrome of left shoulder: Secondary | ICD-10-CM | POA: Diagnosis not present

## 2015-08-07 DIAGNOSIS — K5730 Diverticulosis of large intestine without perforation or abscess without bleeding: Secondary | ICD-10-CM

## 2015-08-07 DIAGNOSIS — I480 Paroxysmal atrial fibrillation: Secondary | ICD-10-CM | POA: Diagnosis not present

## 2015-08-07 DIAGNOSIS — J301 Allergic rhinitis due to pollen: Secondary | ICD-10-CM | POA: Diagnosis not present

## 2015-08-07 DIAGNOSIS — F411 Generalized anxiety disorder: Secondary | ICD-10-CM

## 2015-08-07 DIAGNOSIS — M25512 Pain in left shoulder: Secondary | ICD-10-CM | POA: Diagnosis not present

## 2015-08-07 DIAGNOSIS — Z86010 Personal history of colonic polyps: Secondary | ICD-10-CM

## 2015-08-07 DIAGNOSIS — K588 Other irritable bowel syndrome: Secondary | ICD-10-CM

## 2015-08-07 DIAGNOSIS — G43009 Migraine without aura, not intractable, without status migrainosus: Secondary | ICD-10-CM

## 2015-08-07 DIAGNOSIS — K219 Gastro-esophageal reflux disease without esophagitis: Secondary | ICD-10-CM

## 2015-08-07 LAB — LIPID PANEL
CHOLESTEROL: 176 mg/dL (ref 0–200)
HDL: 52 mg/dL (ref >39.00)
LDL Cholesterol: 107 mg/dL — ABNORMAL HIGH (ref 0–99)
NONHDL: 124.12
Total CHOL/HDL Ratio: 3
Triglycerides: 86 mg/dL (ref 0.0–149.0)
VLDL: 17.2 mg/dL (ref 0.0–40.0)

## 2015-08-07 LAB — BASIC METABOLIC PANEL
BUN: 16 mg/dL (ref 6–23)
CALCIUM: 8.9 mg/dL (ref 8.4–10.5)
CHLORIDE: 107 meq/L (ref 96–112)
CO2: 26 meq/L (ref 19–32)
Creatinine, Ser: 0.58 mg/dL (ref 0.40–1.20)
GFR: 107.22 mL/min (ref >60.00)
GLUCOSE: 90 mg/dL (ref 70–99)
POTASSIUM: 4.2 meq/L (ref 3.5–5.1)
SODIUM: 140 meq/L (ref 135–145)

## 2015-08-07 LAB — CBC WITH DIFFERENTIAL/PLATELET
BASOS ABS: 0 10*3/uL (ref 0.0–0.1)
Basophils Relative: 0.6 % (ref 0.0–3.0)
Eosinophils Absolute: 0.2 10*3/uL (ref 0.0–0.7)
Eosinophils Relative: 3.9 % (ref 0.0–5.0)
HCT: 45.5 % (ref 36.0–46.0)
Hemoglobin: 14.8 g/dL (ref 12.0–15.0)
LYMPHS ABS: 1.3 10*3/uL (ref 0.7–4.0)
Lymphocytes Relative: 25 % (ref 12.0–46.0)
MCHC: 32.5 g/dL (ref 30.0–36.0)
MCV: 97.7 fl (ref 78.0–100.0)
MONO ABS: 0.4 10*3/uL (ref 0.1–1.0)
MONOS PCT: 7.2 % (ref 3.0–12.0)
NEUTROS ABS: 3.4 10*3/uL (ref 1.4–7.7)
NEUTROS PCT: 63.3 % (ref 43.0–77.0)
PLATELETS: 439 10*3/uL — AB (ref 150.0–400.0)
RBC: 4.66 Mil/uL (ref 3.87–5.11)
RDW: 13.4 % (ref 11.5–15.5)
WBC: 5.3 10*3/uL (ref 4.0–10.5)

## 2015-08-07 LAB — HEPATIC FUNCTION PANEL
ALBUMIN: 4.2 g/dL (ref 3.5–5.2)
ALK PHOS: 39 U/L (ref 39–117)
ALT: 14 U/L (ref 0–35)
AST: 19 U/L (ref 0–37)
BILIRUBIN DIRECT: 0.1 mg/dL (ref 0.0–0.3)
TOTAL PROTEIN: 6.6 g/dL (ref 6.0–8.3)
Total Bilirubin: 0.7 mg/dL (ref 0.2–1.2)

## 2015-08-07 LAB — VITAMIN D 25 HYDROXY (VIT D DEFICIENCY, FRACTURES): VITD: 37.1 ng/mL (ref 30.00–100.00)

## 2015-08-07 LAB — TSH: TSH: 4.69 u[IU]/mL — AB (ref 0.35–4.50)

## 2015-08-08 NOTE — Progress Notes (Signed)
Quick Note:  Called and spoke to pt. Informed her of the results and recs per SN. Pt verbalized understanding and denied any further questions or concerns at this time. ______ 

## 2015-08-25 NOTE — Progress Notes (Signed)
Cardiology Office Note    Date:  08/26/2015   ID:  Destiny Sanchez, DOB Oct 09, 1940, MRN [id number]  PCP:  Noralee Space, MD  Cardiologist:  Dr. Loralie Champagne   Electrophysiologist:  n/a  Chief Complaint  Patient presents with  . Follow-up  . Atrial Fibrillation    History of Present Illness:  Destiny Sanchez is a 74 y.o. female with a hx of history of anxiety, migraines, HL, GERD, palpitations, glaucoma, and paroxysmal atrial fibrillation. In 11/14 she was set up for stress echo and event monitor. The stress echo was normal. The event monitor was concerning for several episodes of atrial fibrillation (short runs). No history of GI bleeding. She was started on Xarelto 20 mg daily and metoprolol was transitioned to Toprol XL.   Last seen by Dr. Loralie Champagne in 3/16.  She is doing well.  Has been eating a lot of chocolate during the holidays.  She has noticed more skipped beats.  No rapid palpitations.  The patient denies chest pain, shortness of breath, syncope, orthopnea, PND or significant pedal edema.  Her husband is a patient of Dr. Burt Knack and Dr. Caryl Comes.  He had sudden cardiac death at home last year and the patient did CPR on him at home.  He survived and is currently doing well.     Past Medical History  Diagnosis Date  . Allergy   . Blood transfusion 1967 &1971  . Cancer ([place])     basal cell on nose  . GERD (gastroesophageal reflux disease)   . Glaucoma   . SVT (supraventricular tachycardia) ([place])   . Osteopenia   . Anxiety   . History of migraine   . IBS (irritable bowel syndrome)   . Hx of cardiovascular stress test     ETT-Echo (07/2013):  Normal.  Past Medical History:  1. GLAUCOMA 2. ALLERGIC RHINITIS: has seen Dr Janace Hoard for ENT w/ LER  3. Atrial fibrillation: normal 2D Echo 1993. Echo (9/11): EF 123456, mild diastolic dysfunction, no significant valvular dysfunction, normal RV, PA systolic pressure 31 mmHg. Holter monitor (9/11) with PACs and a short  run (5 beats) of atrial tachycardia. No worrisome arrhythmias. Event monitor (12/14) with several runs of suspected atrial fibrillation.  4. Atypical chest pain: negative Stress Cardiolite 2003 without ischemia and EF=75%. Stress echo (12/14) with EF 65%, no ischemia or infarction.  5. GERD: GI eval Dr Fuller Plan w/ GERD, LER, & Globus phenomenon controlled w/ ACIPHEX which she prefers to the other PPIs (Nexium & Omep without benefit). 6. IRRITABLE BOWEL SYNDROME: last colonoscopy 11/07 by DrStark showed 11mm polyp (tubular adenoma) f/u planned 5 yrs.  7. Hx of MIGRAINE HEADACHE: Hx myofascial pain as well w/ prev accupuncture therapy in 2003 by Dr Gust Rung... prev headache eval in the [facility]...  8. ANXIETY  Past Surgical History  Procedure Laterality Date  . Cataract extraction w/ intraocular lens  implant, bilateral    . Partial nephrectomy  1971    diverticulum on left  . Abdominal hysterectomy  1975  . Mohs surgery    . Hemorroidectomy  1973  . Abdominal hysterectomy      Current Outpatient Prescriptions  Medication Sig Dispense Refill  . ALPRAZolam (XANAX) 0.5 MG tablet Take 1 tablet (0.5 mg total) by mouth 3 (three) times daily as needed for anxiety. 90 tablet 0  . brinzolamide (AZOPT) 1 % ophthalmic suspension 1 drop 3 (three) times daily.    . Calcium Carbonate-Vitamin D (CALTRATE 600+D PO) Take  1,200 mg by mouth daily.      . Cholecalciferol (VITAMIN D) 2000 UNITS tablet Take 2,000 Units by mouth daily.      Marland Kitchen ibandronate (BONIVA) 150 MG tablet Take 150 mg by mouth every 30 (thirty) days. Take in the morning with a full glass of water, on an empty stomach, and do not take anything else by mouth or lie down for the next 30 min.    . latanoprost (XALATAN) 0.005 % ophthalmic solution Place 1 drop into both eyes at bedtime.    . metoprolol succinate (TOPROL-XL) 25 MG 24 hr tablet TAKE ONE TABLET BY MOUTH TWICE DAILY. 180 tablet 3  . multivitamin (THERAGRAN) per tablet Take 1  tablet by mouth daily.      . rivaroxaban (XARELTO) 20 MG TABS tablet TAKE ONE TABLET BY MOUTH ONCE DAILY WITH  SUPPER. 90 tablet 3   No current facility-administered medications for this visit.    Allergies:   Review of patient's allergies indicates no known allergies.   Social History   Social History  . Marital Status: Married    Spouse Name: N/A  . Number of Children: N/A  . Years of Education: N/A   Social History Main Topics  . Smoking status: Former Smoker -- 1.00 packs/day for 10 years    Types: Cigarettes    Quit date: 08/30/1962  . Smokeless tobacco: Never Used  . Alcohol Use: 1.8 oz/week    3 Glasses of wine per week  . Drug Use: No  . Sexual Activity: Not Asked   Other Topics Concern  . None   Social History Narrative     Family History:  The patient's family history includes Colon cancer in her maternal aunt; Throat cancer in her father.   ROS:   Please see the history of present illness.    Review of Systems  Cardiovascular: Positive for irregular heartbeat.  Musculoskeletal: Positive for myalgias.  Psychiatric/Behavioral: The patient is nervous/anxious.   All other systems reviewed and are negative.   PHYSICAL EXAM:   VS:  BP 100/70 mmHg  Pulse 68  Ht 5\' 6"  (1.676 m)  Wt 126 lb 6.4 oz (57.335 kg)  BMI 20.41 kg/m2  SpO2 97%   GEN: Well nourished, well developed, in no acute distress HEENT: normal Neck: no JVD, no masses Cardiac: Normal S1/S2, RRR; no murmurs, rubs, or gallops, no edema;     Respiratory:  clear to auscultation bilaterally; no wheezing, rhonchi or rales GI: soft, nontender, nondistended, + BS MS: no deformity or atrophy Skin: warm and dry, no rash Neuro:  Bilateral strength equal, no focal deficits  Psych: Alert and oriented x 3, normal affect  Wt Readings from Last 3 Encounters:  08/26/15 126 lb 6.4 oz (57.335 kg)  08/06/15 127 lb (57.607 kg)  11/28/14 129 lb (58.514 kg)      Studies/Labs Reviewed:   EKG:  EKG is   ordered today.  The ekg ordered today demonstrates NSR, HR 68, normal axis, QTc 423 ms, no change since 11/28/14  Recent Labs: 08/07/2015: ALT 14; BUN 16; Creatinine, Ser 0.58; Hemoglobin 14.8; Platelets 439.0*; Potassium 4.2; Sodium 140; TSH 4.69*   Recent Lipid Panel    Component Value Date/Time   CHOL 176 08/07/2015 0824   TRIG 86.0 08/07/2015 0824   HDL 52.00 08/07/2015 0824   CHOLHDL 3 08/07/2015 0824   VLDL 17.2 08/07/2015 0824   LDLCALC 107* 08/07/2015 0824   LDLDIRECT 127.0 03/11/2010 0910    Additional studies/  records that were reviewed today include:   Event Monitor 12/14 Short runs of AFib  ETT-Echo 12/14 - Normal increase in thickening and conractility. Normal stress echo.  Echo 9/11 EF 60-65%, no RWMA, Gr 1 DD, PASP 31 mmHg   ASSESSMENT:    1. Paroxysmal atrial fibrillation (HCC)     PLAN:  In order of problems listed above:  1. Atrial fibrillation: Paroxysmal. Noted by event monitor. CHADSVASC = 3, no history of bleeding. She is on Xarelto 20 mg daily and will continue.  She is doing well on Toprol XL, continue current dose. BMET/CBC this month with PCP ok.    Medication Adjustments/Labs and Tests Ordered: Current medicines are reviewed at length with the patient today.  Concerns regarding medicines are outlined above.  Medication changes, Labs and Tests ordered today are listed below. Patient Instructions  Medication Instructions:  Your physician recommends that you continue on your current medications as directed. Please refer to the Current Medication list given to you today. Metoprolol and Xarelto were filled at office today Samples of Xarelto were given Patient was given assistance paperwork today for Xarelto  Labwork: Your physician recommends that you return for lab work in June: cbc/bmet  Testing/Procedures: -None  Follow-Up: Your physician wants you to follow-up in: 1 year with Dr. Aundra Dubin.  You will receive a reminder letter in the  mail two months in advance. If you don't receive a letter, please call our office to schedule the follow-up appointment.  Any Other Special Instructions Will Be Listed Below (If Applicable).  If you need a refill on your cardiac medications before your next appointment, please call your pharmacy.       Signed, Richardson Dopp, PA-C  08/26/2015 4:45 PM    [facility] [address], [place], [place]  [address] Phone: [phone]; Fax: [phone]

## 2015-08-26 ENCOUNTER — Encounter: Payer: Self-pay | Admitting: Physician Assistant

## 2015-08-26 ENCOUNTER — Ambulatory Visit (INDEPENDENT_AMBULATORY_CARE_PROVIDER_SITE_OTHER): Payer: Medicare HMO | Admitting: Physician Assistant

## 2015-08-26 VITALS — BP 100/70 | HR 68 | Ht 66.0 in | Wt 126.4 lb

## 2015-08-26 DIAGNOSIS — I480 Paroxysmal atrial fibrillation: Secondary | ICD-10-CM

## 2015-08-26 MED ORDER — METOPROLOL SUCCINATE ER 25 MG PO TB24: ORAL_TABLET | ORAL | Status: DC

## 2015-08-26 MED ORDER — RIVAROXABAN 20 MG PO TABS: ORAL_TABLET | ORAL | Status: DC

## 2015-08-26 NOTE — Patient Instructions (Addendum)
Medication Instructions:  Your physician recommends that you continue on your current medications as directed. Please refer to the Current Medication list given to you today. Metoprolol and Xarelto were filled at office today Samples of Xarelto were given Patient was given assistance paperwork today for Xarelto  Labwork: Your physician recommends that you return for lab work in June: cbc/bmet  Testing/Procedures: -None  Follow-Up: Your physician wants you to follow-up in: 1 year with Dr. Aundra Dubin.  You will receive a reminder letter in the mail two months in advance. If you don't receive a letter, please call our office to schedule the follow-up appointment.  Any Other Special Instructions Will Be Listed Below (If Applicable).  If you need a refill on your cardiac medications before your next appointment, please call your pharmacy.

## 2015-09-24 DIAGNOSIS — Z1231 Encounter for screening mammogram for malignant neoplasm of breast: Secondary | ICD-10-CM | POA: Diagnosis not present

## 2015-09-24 DIAGNOSIS — M810 Age-related osteoporosis without current pathological fracture: Secondary | ICD-10-CM | POA: Diagnosis not present

## 2015-09-24 DIAGNOSIS — Z01419 Encounter for gynecological examination (general) (routine) without abnormal findings: Secondary | ICD-10-CM | POA: Diagnosis not present

## 2015-09-24 DIAGNOSIS — R69 Illness, unspecified: Secondary | ICD-10-CM | POA: Diagnosis not present

## 2015-09-24 DIAGNOSIS — R87612 Low grade squamous intraepithelial lesion on cytologic smear of cervix (LGSIL): Secondary | ICD-10-CM | POA: Diagnosis not present

## 2015-09-24 DIAGNOSIS — R896 Abnormal cytological findings in specimens from other organs, systems and tissues: Secondary | ICD-10-CM | POA: Diagnosis not present

## 2015-09-24 DIAGNOSIS — Z779 Other contact with and (suspected) exposures hazardous to health: Secondary | ICD-10-CM | POA: Diagnosis not present

## 2015-10-17 DIAGNOSIS — R69 Illness, unspecified: Secondary | ICD-10-CM | POA: Diagnosis not present

## 2015-10-17 DIAGNOSIS — R87622 Low grade squamous intraepithelial lesion on cytologic smear of vagina (LGSIL): Secondary | ICD-10-CM | POA: Diagnosis not present

## 2015-10-17 DIAGNOSIS — N870 Mild cervical dysplasia: Secondary | ICD-10-CM | POA: Diagnosis not present

## 2016-01-05 ENCOUNTER — Encounter: Payer: Self-pay | Admitting: Physician Assistant

## 2016-01-05 ENCOUNTER — Telehealth: Payer: Self-pay | Admitting: Cardiology

## 2016-01-05 ENCOUNTER — Ambulatory Visit (INDEPENDENT_AMBULATORY_CARE_PROVIDER_SITE_OTHER): Payer: Medicare HMO | Admitting: Physician Assistant

## 2016-01-05 VITALS — BP 138/60 | HR 88 | Ht 66.0 in | Wt 127.0 lb

## 2016-01-05 DIAGNOSIS — K219 Gastro-esophageal reflux disease without esophagitis: Secondary | ICD-10-CM | POA: Diagnosis not present

## 2016-01-05 DIAGNOSIS — I480 Paroxysmal atrial fibrillation: Secondary | ICD-10-CM | POA: Diagnosis not present

## 2016-01-05 DIAGNOSIS — R002 Palpitations: Secondary | ICD-10-CM

## 2016-01-05 DIAGNOSIS — R079 Chest pain, unspecified: Secondary | ICD-10-CM

## 2016-01-05 LAB — CBC WITH DIFFERENTIAL/PLATELET
BASOS ABS: 72 {cells}/uL (ref 0–200)
BASOS PCT: 1 %
EOS ABS: 216 {cells}/uL (ref 15–500)
Eosinophils Relative: 3 %
HEMATOCRIT: 43.6 % (ref 35.0–45.0)
HEMOGLOBIN: 14.4 g/dL (ref 11.7–15.5)
LYMPHS ABS: 1656 {cells}/uL (ref 850–3900)
Lymphocytes Relative: 23 %
MCH: 31.9 pg (ref 27.0–33.0)
MCHC: 33 g/dL (ref 32.0–36.0)
MCV: 96.7 fL (ref 80.0–100.0)
MONO ABS: 432 {cells}/uL (ref 200–950)
MONOS PCT: 6 %
MPV: 9.8 fL (ref 7.5–12.5)
NEUTROS ABS: 4824 {cells}/uL (ref 1500–7800)
Neutrophils Relative %: 67 %
PLATELETS: 460 10*3/uL — AB (ref 140–400)
RBC: 4.51 MIL/uL (ref 3.80–5.10)
RDW: 13.2 % (ref 11.0–15.0)
WBC: 7.2 10*3/uL (ref 3.8–10.8)

## 2016-01-05 MED ORDER — PANTOPRAZOLE SODIUM 40 MG PO TBEC: 40.0000 mg | DELAYED_RELEASE_TABLET | ORAL | Status: DC

## 2016-01-05 NOTE — Telephone Encounter (Signed)
Pt calling c/o SOB, Irregular heartbeat, bp low-chest pressure-fatigue-pls advise [phone]

## 2016-01-05 NOTE — Patient Instructions (Addendum)
Medication Instructions:  1. START PROTONIX 40 MG DAILY FOR 2 WEEKS THEN TAKE AS NEEDED Labwork: 1. TODAY BMET, MAGNESIUM LEVEL, CBC W/DIFF, TSH, LFT Testing/Procedures: Your physician has requested that you have a stress echocardiogram. For further information please visit [email or web address]. Please follow instruction sheet as given. Follow-Up: SCOTT WEAVER, PAC 2-3 WEEKS ; IF POSSIBLE SAME DAY DR. Aundra Dubin IS IN THE OFFICE Any Other Special Instructions Will Be Listed Below (If Applicable). CHECK WITH YOUR INSURANCE ABOUT THE COST OF PRADAXA OR  ELIQUIS OK TO HOLD EVENING DOSE OF TOPROL IF SYSTOLIC BP IS BELOW [id number]  If you need a refill on your cardiac medications before your next appointment, please call your pharmacy.

## 2016-01-05 NOTE — Telephone Encounter (Signed)
Pt denies lightheadedness, dizziness, chest pain. Pt states she feels tired and does have shortness of breath with exertion.

## 2016-01-05 NOTE — Progress Notes (Signed)
Cardiology Office Note:    Date:  01/05/2016   ID:  JAZMINN FEDERMAN, DOB 1941/04/01, MRN [id number]  PCP:  Noralee Space, MD  Cardiologist:  Dr. Loralie Champagne   Electrophysiologist:  n/a  Referring MD: Noralee Space, MD   Chief Complaint  Patient presents with  . Palpitations    History of Present Illness:     Destiny Sanchez is a 75 y.o. female with a hx of anxiety, migraines, HL, GERD, palpitations, glaucoma, and paroxysmal atrial fibrillation. In 11/14 she was set up for stress echo and event monitor. The stress echo was normal. The event monitor was concerning for several episodes of atrial fibrillation (short runs). No history of GI bleeding. She was started on Xarelto 20 mg daily and metoprolol was transitioned to Toprol XL. Last seen in clinic by me in 12/16.  Patient called in with palpitations and is added on to my schedule for evaluation.    She has been under a lot stress. Her husband had cardiac arrest at home a few years ago and she administered CPR.  Her husband sees Dr. Burt Knack and Dr. Caryl Comes.  She has always been a little anxious with him, but he overall is doing very well. She had a lot of company stay with them over the last couple of months.  She exercises several times a week.  But, over the weekend, she started to feel poorly.  She has notes diffuse aches as well as aches in her chest.  She overall felt worse while walking on the treadmill over the weekend and stopped early.  She has been somewhat short of breath. She notes significant issues with acid reflux.  She has not taken anything as she is afraid of the interaction with Xarelto.  She notes water brash symptoms and burning in her throat.  She denies orthopnea, PND. She has noted increased palpitations.  Denies syncope.  She does not drink a lot of caffeine.  BP was low at home over the weekend (90/60s).  She pushed fluids and feels better today.     Past Medical History  Diagnosis Date  . Allergy   .  Blood transfusion 1967 &1971  . Cancer ([place])     basal cell on nose  . GERD (gastroesophageal reflux disease)   . Glaucoma   . SVT (supraventricular tachycardia) ([place])   . Osteopenia   . Anxiety   . History of migraine   . IBS (irritable bowel syndrome)   . Hx of cardiovascular stress test     ETT-Echo (07/2013):  Normal.  Past Medical History:  1. GLAUCOMA 2. ALLERGIC RHINITIS: has seen Dr Janace Hoard for ENT w/ LER  3. Atrial fibrillation: normal 2D Echo 1993. Echo (9/11): EF 123456, mild diastolic dysfunction, no significant valvular dysfunction, normal RV, PA systolic pressure 31 mmHg. Holter monitor (9/11) with PACs and a short run (5 beats) of atrial tachycardia. No worrisome arrhythmias. Event monitor (12/14) with several runs of suspected atrial fibrillation.  4. Atypical chest pain: negative Stress Cardiolite 2003 without ischemia and EF=75%. Stress echo (12/14) with EF 65%, no ischemia or infarction.  5. GERD: GI eval Dr Fuller Plan w/ GERD, LER, & Globus phenomenon controlled w/ ACIPHEX which she prefers to the other PPIs (Nexium & Omep without benefit). 6. IRRITABLE BOWEL SYNDROME: last colonoscopy 11/07 by DrStark showed 35mm polyp (tubular adenoma) f/u planned 5 yrs.  7. Hx of MIGRAINE HEADACHE: Hx myofascial pain as well w/ prev accupuncture therapy in 2003 by Dr  Bloom... prev headache eval in the [facility]...  8. ANXIETY   Past Surgical History  Procedure Laterality Date  . Cataract extraction w/ intraocular lens  implant, bilateral    . Partial nephrectomy  1971    diverticulum on left  . Abdominal hysterectomy  1975  . Mohs surgery    . Hemorroidectomy  1973  . Abdominal hysterectomy      Current Medications: Outpatient Prescriptions Prior to Visit  Medication Sig Dispense Refill  . ALPRAZolam (XANAX) 0.5 MG tablet Take 1 tablet (0.5 mg total) by mouth 3 (three) times daily as needed for anxiety. 90 tablet 0  . brinzolamide (AZOPT) 1 % ophthalmic suspension  Place 1 drop into both eyes 3 (three) times daily.     . Calcium Carbonate-Vitamin D (CALTRATE 600+D PO) Take 1,200 mg by mouth daily.      . Cholecalciferol (VITAMIN D) 2000 UNITS tablet Take 2,000 Units by mouth daily.      Marland Kitchen latanoprost (XALATAN) 0.005 % ophthalmic solution Place 1 drop into both eyes at bedtime.    . metoprolol succinate (TOPROL-XL) 25 MG 24 hr tablet TAKE ONE TABLET BY MOUTH TWICE DAILY. 180 tablet 3  . rivaroxaban (XARELTO) 20 MG TABS tablet TAKE ONE TABLET BY MOUTH ONCE DAILY WITH  SUPPER. 90 tablet 3  . ibandronate (BONIVA) 150 MG tablet Take 150 mg by mouth every 30 (thirty) days. Reported on 01/05/2016    . multivitamin (THERAGRAN) per tablet Take 1 tablet by mouth daily. Reported on 01/05/2016     No facility-administered medications prior to visit.      Allergies:   Review of patient's allergies indicates no known allergies.   Social History   Social History  . Marital Status: Married    Spouse Name: N/A  . Number of Children: N/A  . Years of Education: N/A   Social History Main Topics  . Smoking status: Former Smoker -- 1.00 packs/day for 10 years    Types: Cigarettes    Quit date: 08/30/1962  . Smokeless tobacco: Never Used  . Alcohol Use: 1.8 oz/week    3 Glasses of wine per week  . Drug Use: No  . Sexual Activity: Not Asked   Other Topics Concern  . None   Social History Narrative     Family History:  The patient's family history includes Colon cancer in her maternal aunt; Throat cancer in her father.   ROS:   Please see the history of present illness.    Review of Systems  HENT: Positive for headaches.   Cardiovascular: Positive for chest pain, dyspnea on exertion and irregular heartbeat.   All other systems reviewed and are negative.   Physical Exam:    VS:  BP 138/60 mmHg  Pulse 88  Ht 5\' 6"  (1.676 m)  Wt 127 lb (57.607 kg)  BMI 20.51 kg/m2  SpO2 98%   GEN: Well nourished, well developed, in no acute distress HEENT:  normal Neck: no JVD, no masses Cardiac: Normal S1/S2, RRR; no murmurs, rubs, or gallops, no edema;     Respiratory:  clear to auscultation bilaterally; no wheezing, rhonchi or rales GI: soft, nontender, nondistended MS: no deformity or atrophy Skin: warm and dry Neuro: No focal deficits  Psych: Alert and oriented x 3, normal affect  Wt Readings from Last 3 Encounters:  01/05/16 127 lb (57.607 kg)  08/26/15 126 lb 6.4 oz (57.335 kg)  08/06/15 127 lb (57.607 kg)      Studies/Labs Reviewed:  EKG:  EKG is  ordered today.  The ekg ordered today demonstrates NSR, HR 88, normal axis, frequent PACs, QTc 450 ms  Recent Labs: 08/07/2015: ALT 14; BUN 16; Creatinine, Ser 0.58; Hemoglobin 14.8; Platelets 439.0*; Potassium 4.2; Sodium 140; TSH 4.69*   Recent Lipid Panel    Component Value Date/Time   CHOL 176 08/07/2015 0824   TRIG 86.0 08/07/2015 0824   HDL 52.00 08/07/2015 0824   CHOLHDL 3 08/07/2015 0824   VLDL 17.2 08/07/2015 0824   LDLCALC 107* 08/07/2015 0824   LDLDIRECT 127.0 03/11/2010 0910    Additional studies/ records that were reviewed today include:   Event Monitor 12/14 Short runs of AFib  ETT-Echo 12/14 - Normal increase in thickening and conractility. Normal stress echo.  Echo 9/11 EF 60-65%, no RWMA, Gr 1 DD, PASP 31 mmHg   ASSESSMENT:     1. Chest pain, unspecified chest pain type   2. Palpitations   3. Paroxysmal atrial fibrillation (HCC)   4. Gastroesophageal reflux disease without esophagitis     PLAN:     In order of problems listed above:  1. Chest pain - She has several different symptoms that sound c/w viral syndrome.  She was fatigued all weekend and had diffuse aches.  She also has a lot of reflux symptoms.  She wonders if she has had some biliary colic as well.  she still has her gallbladder.  Her ECG is unchanged aside from frequent PACs.  She has not had an assessment for ischemia since 2014.    -  BMET, LFTs, CBC, TSH, Mg2+  -  ETT-Echo  to rule out ischemia  -  Protonix 40 mg QD x 2 weeks, then Pepcid or Protonix prn.  -  Reassurance.  Suspect symptoms will improve as viral illness resolves.  -  FU 2-3 weeks.  2. Palpitations - She has symptomatic PACs. She has had some low BP recently.  I do not think her BP would tolerate increasing this.  Continue to avoid caffeine.  Check TSH, BMET, Mg2+.    3. PAF - NSR today.  CHADS2-VASc=3 (Female, age > 25).  Continue Xarelto.  She is concerned about cost. She can check with her insurance to see if Pradaxa or Eliquis is cheaper.    4. GERD - Protonix x 2 weeks, then prn. FU with GI as planned later this year for FU colonoscopy.  She may need EGD as well.  If ALP is elevated, she will need sooner FU for ?gallbladder disease.       Medication Adjustments/Labs and Tests Ordered: Current medicines are reviewed at length with the patient today.  Concerns regarding medicines are outlined above.  Medication changes, Labs and Tests ordered today are outlined in the Patient Instructions noted below. Patient Instructions  Medication Instructions:  1. START PROTONIX 40 MG DAILY FOR 2 WEEKS THEN TAKE AS NEEDED Labwork: 1. TODAY BMET, MAGNESIUM LEVEL, CBC W/DIFF, TSH, LFT Testing/Procedures: Your physician has requested that you have a stress echocardiogram. For further information please visit [email or web address]. Please follow instruction sheet as given. Follow-Up: Kearie Mennen, PAC 2-3 WEEKS ; IF POSSIBLE SAME DAY DR. Aundra Dubin IS IN THE OFFICE Any Other Special Instructions Will Be Listed Below (If Applicable). CHECK WITH YOUR INSURANCE ABOUT THE COST OF PRADAXA OR  ELIQUIS OK TO HOLD EVENING DOSE OF TOPROL IF SYSTOLIC BP IS BELOW [id number]  If you need a refill on your cardiac medications before your next appointment, please call your pharmacy.  Signed, Richardson Dopp, PA-C  01/05/2016 3:46 PM    [facility] [place], [place], [place]  [address] Phone: [phone]; Fax: [phone]

## 2016-01-05 NOTE — Telephone Encounter (Signed)
Reviewed with Lori--pt should be scheduled for follow up office visit.   Pt advised she has been scheduled to see Richardson Dopp, PA today at 2:40PM.

## 2016-01-05 NOTE — Telephone Encounter (Signed)
Pt states all weekend her heart has been skipping beats. Pt states she had been doing fine until the weekend.  Pt states BP yesterday 98/48, today BP 100/49 heart rate 48 when she first got up, now BP 103/60 heart rate 56.

## 2016-01-06 LAB — HEPATIC FUNCTION PANEL
ALK PHOS: 42 U/L (ref 33–130)
ALT: 12 U/L (ref 6–29)
AST: 17 U/L (ref 10–35)
Albumin: 4.4 g/dL (ref 3.6–5.1)
BILIRUBIN DIRECT: 0.1 mg/dL (ref <=0.2)
BILIRUBIN INDIRECT: 0.4 mg/dL (ref 0.2–1.2)
TOTAL PROTEIN: 6.7 g/dL (ref 6.1–8.1)
Total Bilirubin: 0.5 mg/dL (ref 0.2–1.2)

## 2016-01-06 LAB — BASIC METABOLIC PANEL
BUN: 13 mg/dL (ref 7–25)
CALCIUM: 9.7 mg/dL (ref 8.6–10.4)
CHLORIDE: 105 mmol/L (ref 98–110)
CO2: 27 mmol/L (ref 20–31)
Creat: 0.56 mg/dL — ABNORMAL LOW (ref 0.60–0.93)
GLUCOSE: 85 mg/dL (ref 65–99)
Potassium: 4.4 mmol/L (ref 3.5–5.3)
SODIUM: 141 mmol/L (ref 135–146)

## 2016-01-06 LAB — TSH: TSH: 4.55 mIU/L — ABNORMAL HIGH

## 2016-01-06 LAB — MAGNESIUM: Magnesium: 2.3 mg/dL (ref 1.5–2.5)

## 2016-01-27 ENCOUNTER — Telehealth (HOSPITAL_COMMUNITY): Payer: Self-pay | Admitting: *Deleted

## 2016-01-27 NOTE — Telephone Encounter (Signed)
Patient given detailed instructions per Stress Test Requisition Sheet for test on 01/28/16 at 7:30.Patient Notified to arrive 30 minutes early, and that it is imperative to arrive on time for appointment to keep from having the test rescheduled.  Patient verbalized understanding. Destiny Sanchez

## 2016-01-28 ENCOUNTER — Ambulatory Visit (HOSPITAL_BASED_OUTPATIENT_CLINIC_OR_DEPARTMENT_OTHER): Payer: Medicare HMO

## 2016-01-28 ENCOUNTER — Ambulatory Visit (HOSPITAL_COMMUNITY): Payer: Medicare HMO | Attending: Cardiovascular Disease

## 2016-01-28 DIAGNOSIS — R002 Palpitations: Secondary | ICD-10-CM | POA: Diagnosis not present

## 2016-01-28 DIAGNOSIS — R079 Chest pain, unspecified: Secondary | ICD-10-CM | POA: Diagnosis not present

## 2016-01-28 DIAGNOSIS — R0989 Other specified symptoms and signs involving the circulatory and respiratory systems: Secondary | ICD-10-CM

## 2016-01-29 ENCOUNTER — Encounter: Payer: Self-pay | Admitting: Physician Assistant

## 2016-01-29 ENCOUNTER — Telehealth: Payer: Self-pay | Admitting: *Deleted

## 2016-01-29 NOTE — Telephone Encounter (Signed)
Pt notified of myoview results by phone with verbal understanding 

## 2016-02-10 ENCOUNTER — Other Ambulatory Visit (INDEPENDENT_AMBULATORY_CARE_PROVIDER_SITE_OTHER): Payer: Medicare HMO | Admitting: *Deleted

## 2016-02-10 DIAGNOSIS — I4891 Unspecified atrial fibrillation: Secondary | ICD-10-CM | POA: Diagnosis not present

## 2016-02-10 DIAGNOSIS — I480 Paroxysmal atrial fibrillation: Secondary | ICD-10-CM

## 2016-02-10 LAB — CBC WITH DIFFERENTIAL/PLATELET
BASOS PCT: 1 %
Basophils Absolute: 47 cells/uL (ref 0–200)
EOS ABS: 141 {cells}/uL (ref 15–500)
Eosinophils Relative: 3 %
HEMATOCRIT: 42.5 % (ref 35.0–45.0)
HEMOGLOBIN: 14.2 g/dL (ref 11.7–15.5)
LYMPHS ABS: 1128 {cells}/uL (ref 850–3900)
Lymphocytes Relative: 24 %
MCH: 32.2 pg (ref 27.0–33.0)
MCHC: 33.4 g/dL (ref 32.0–36.0)
MCV: 96.4 fL (ref 80.0–100.0)
MONO ABS: 423 {cells}/uL (ref 200–950)
MPV: 9.8 fL (ref 7.5–12.5)
Monocytes Relative: 9 %
NEUTROS ABS: 2961 {cells}/uL (ref 1500–7800)
Neutrophils Relative %: 63 %
Platelets: 413 10*3/uL — ABNORMAL HIGH (ref 140–400)
RBC: 4.41 MIL/uL (ref 3.80–5.10)
RDW: 13.3 % (ref 11.0–15.0)
WBC: 4.7 10*3/uL (ref 3.8–10.8)

## 2016-02-10 LAB — BASIC METABOLIC PANEL
BUN: 15 mg/dL (ref 7–25)
CHLORIDE: 107 mmol/L (ref 98–110)
CO2: 25 mmol/L (ref 20–31)
Calcium: 9.1 mg/dL (ref 8.6–10.4)
Creat: 0.6 mg/dL (ref 0.60–0.93)
Glucose, Bld: 90 mg/dL (ref 65–99)
POTASSIUM: 4.1 mmol/L (ref 3.5–5.3)
Sodium: 140 mmol/L (ref 135–146)

## 2016-02-16 DIAGNOSIS — H52203 Unspecified astigmatism, bilateral: Secondary | ICD-10-CM | POA: Diagnosis not present

## 2016-02-16 DIAGNOSIS — H401111 Primary open-angle glaucoma, right eye, mild stage: Secondary | ICD-10-CM | POA: Diagnosis not present

## 2016-02-16 DIAGNOSIS — H401122 Primary open-angle glaucoma, left eye, moderate stage: Secondary | ICD-10-CM | POA: Diagnosis not present

## 2016-02-20 ENCOUNTER — Ambulatory Visit: Payer: Medicare HMO | Admitting: Physician Assistant

## 2016-04-22 DIAGNOSIS — L723 Sebaceous cyst: Secondary | ICD-10-CM | POA: Diagnosis not present

## 2016-04-22 DIAGNOSIS — L821 Other seborrheic keratosis: Secondary | ICD-10-CM | POA: Diagnosis not present

## 2016-04-22 DIAGNOSIS — D1801 Hemangioma of skin and subcutaneous tissue: Secondary | ICD-10-CM | POA: Diagnosis not present

## 2016-04-22 DIAGNOSIS — B351 Tinea unguium: Secondary | ICD-10-CM | POA: Diagnosis not present

## 2016-04-22 DIAGNOSIS — Z85828 Personal history of other malignant neoplasm of skin: Secondary | ICD-10-CM | POA: Diagnosis not present

## 2016-06-18 ENCOUNTER — Encounter: Payer: Self-pay | Admitting: Gastroenterology

## 2016-06-25 DIAGNOSIS — H401122 Primary open-angle glaucoma, left eye, moderate stage: Secondary | ICD-10-CM | POA: Diagnosis not present

## 2016-06-25 DIAGNOSIS — H401111 Primary open-angle glaucoma, right eye, mild stage: Secondary | ICD-10-CM | POA: Diagnosis not present

## 2016-07-06 DIAGNOSIS — R69 Illness, unspecified: Secondary | ICD-10-CM | POA: Diagnosis not present

## 2016-08-05 ENCOUNTER — Ambulatory Visit: Payer: Medicare HMO | Admitting: Pulmonary Disease

## 2016-08-17 ENCOUNTER — Ambulatory Visit (INDEPENDENT_AMBULATORY_CARE_PROVIDER_SITE_OTHER): Payer: Medicare HMO | Admitting: Gastroenterology

## 2016-08-17 ENCOUNTER — Encounter: Payer: Self-pay | Admitting: Gastroenterology

## 2016-08-17 ENCOUNTER — Telehealth: Payer: Self-pay

## 2016-08-17 VITALS — BP 110/70 | HR 64

## 2016-08-17 DIAGNOSIS — Z86010 Personal history of colonic polyps: Secondary | ICD-10-CM

## 2016-08-17 DIAGNOSIS — Z7901 Long term (current) use of anticoagulants: Secondary | ICD-10-CM | POA: Diagnosis not present

## 2016-08-17 DIAGNOSIS — K219 Gastro-esophageal reflux disease without esophagitis: Secondary | ICD-10-CM

## 2016-08-17 MED ORDER — NA SULFATE-K SULFATE-MG SULF 17.5-3.13-1.6 GM/177ML PO SOLN: 1.0000 | Freq: Once | ORAL | 0 refills | Status: AC

## 2016-08-17 MED ORDER — PANTOPRAZOLE SODIUM 40 MG PO TBEC: 40.0000 mg | DELAYED_RELEASE_TABLET | ORAL | 11 refills | Status: DC

## 2016-08-17 NOTE — Patient Instructions (Signed)
You have been scheduled for a colonoscopy. Please follow written instructions given to you at your visit today.  Please pick up your prep supplies at the pharmacy within the next 1-3 days. If you use inhalers (even only as needed), please bring them with you on the day of your procedure. Your physician has requested that you go to www.startemmi.com and enter the access code given to you at your visit today. This web site gives a general overview about your procedure. However, you should still follow specific instructions given to you by our office regarding your preparation for the procedure.  Thank you for choosing me and [facility].  Malcolm T. Stark, Jr., MD., FACG  

## 2016-08-17 NOTE — Telephone Encounter (Signed)
   KEAMBER KREBS August 12, 1941 [id number]  Dear Richardson Dopp, PA:  We have scheduled the above named patient for a(n) Colonoscopy procedure. Our records show that (s)he is on anticoagulation therapy.  Please advise as to whether the patient may come of their therapy of Xarelto 2 days prior to their procedure which is scheduled for 10/25/16.  Please route your response to Marlon Pel, CMA or fax response to [phone].  Sincerely,    [facility]

## 2016-08-17 NOTE — Progress Notes (Signed)
History of Present Illness: This is a 75 year old female for the evaluation of personal history of adenomatous colon polyps maintained on Xarelto. Her last colonoscopy was performed in November 2012 and she is due for a five-year interval surveillance colonoscopy. She has had frequent reflux symptoms for the past several months that are controlled with the regular use of pantoprazole however she has been using it sparingly. Denies weight loss, abdominal pain, constipation, diarrhea, change in stool caliber, melena, hematochezia, nausea, vomiting, dysphagia, chest pain.   No Known Allergies Outpatient Medications Prior to Visit  Medication Sig Dispense Refill  . ALPRAZolam (XANAX) 0.5 MG tablet Take 1 tablet (0.5 mg total) by mouth 3 (three) times daily as needed for anxiety. 90 tablet 0  . brinzolamide (AZOPT) 1 % ophthalmic suspension Place 1 drop into both eyes 3 (three) times daily.     . Calcium Carbonate-Vitamin D (CALTRATE 600+D PO) Take 1,200 mg by mouth daily.      . Cholecalciferol (VITAMIN D) 2000 UNITS tablet Take 2,000 Units by mouth daily.      Marland Kitchen latanoprost (XALATAN) 0.005 % ophthalmic solution Place 1 drop into both eyes at bedtime.    . metoprolol succinate (TOPROL-XL) 25 MG 24 hr tablet TAKE ONE TABLET BY MOUTH TWICE DAILY. 180 tablet 3  . pantoprazole (PROTONIX) 40 MG tablet Take 1 tablet (40 mg total) by mouth as directed. DAILY FOR 2 WEEKS THEN AS NEEDED 30 tablet 2  . rivaroxaban (XARELTO) 20 MG TABS tablet TAKE ONE TABLET BY MOUTH ONCE DAILY WITH  SUPPER. 90 tablet 3   No facility-administered medications prior to visit.    Past Medical History:  Diagnosis Date  . Adenomatous colon polyp 06/2006  . Allergy   . Anxiety   . Blood transfusion 1967 &1971  . Cancer ([place])    basal cell on nose  . GERD (gastroesophageal reflux disease)   . Glaucoma   . History of migraine   . Hx of cardiovascular stress test    a. ETT-Echo (07/2013):  Normal. //  b. ETT-Echo 6/17 -  Normal study after maximal exercise  . IBS (irritable bowel syndrome)   . Osteopenia   . SVT (supraventricular tachycardia) (HCC)    Past Surgical History:  Procedure Laterality Date  . ABDOMINAL HYSTERECTOMY  1975  . ABDOMINAL HYSTERECTOMY    . CATARACT EXTRACTION W/ INTRAOCULAR LENS  IMPLANT, BILATERAL    . HEMORROIDECTOMY  1973  . MOHS SURGERY    . PARTIAL NEPHRECTOMY  1971   diverticulum on left   Social History   Social History  . Marital status: Married    Spouse name: N/A  . Number of children: N/A  . Years of education: N/A   Social History Main Topics  . Smoking status: Former Smoker    Packs/day: 1.00    Years: 10.00    Types: Cigarettes    Quit date: 08/30/1962  . Smokeless tobacco: Never Used  . Alcohol use 1.8 oz/week    3 Glasses of wine per week  . Drug use: No  . Sexual activity: Not Asked   Other Topics Concern  . None   Social History Narrative  . None   Family History  Problem Relation Age of Onset  . Colon cancer Maternal Aunt   . Throat cancer Father     larynx cancer       Review of Systems: Pertinent positive and negative review of systems were noted in the above HPI  section. All other review of systems were otherwise negative.    Physical Exam: General: Well developed, well nourished, no acute distress Head: Normocephalic and atraumatic Eyes:  sclerae anicteric, EOMI Ears: Normal auditory acuity Mouth: No deformity or lesions Neck: Supple, no masses or thyromegaly Lungs: Clear throughout to auscultation Heart: Regular rate and rhythm; no murmurs, rubs or bruits Abdomen: Soft, non tender and non distended. No masses, hepatosplenomegaly or hernias noted. Normal Bowel sounds Rectal: Deferred to colonoscopy Musculoskeletal: Symmetrical with no gross deformities  Skin: No lesions on visible extremities Pulses:  Normal pulses noted Extremities: No clubbing, cyanosis, edema or deformities noted Neurological: Alert oriented x 4,  grossly nonfocal Cervical Nodes:  No significant cervical adenopathy Inguinal Nodes: No significant inguinal adenopathy Psychological:  Alert and cooperative. Normal mood and affect  Assessment and Recommendations:  1. Personal history of adenomatous colon polyps. She is due for a five-year surveillance colonoscopy. The risks (including bleeding, perforation, infection, missed lesions, medication reactions and possible hospitalization or surgery if complications occur), benefits, and alternatives to colonoscopy with possible biopsy and possible polypectomy were discussed with the patient and they consent to proceed.   2. GERD. Follow standard antireflux measures and take pantoprazole 40 mg daily for 2 months and then may use when necessary if symptoms are well-controlled.  3. Hold Xarelto 2 days before procedure - will instruct when and how to resume after procedure. Low but real risk of cardiovascular event such as heart attack, stroke, embolism, thrombosis or ischemia/infarct of other organs off Xarelto explained and need to seek urgent help if this occurs. The patient consents to proceed. Will communicate by phone or EMR with patient's prescribing provider to confirm that holding Xarelto is reasonable in this case.

## 2016-08-17 NOTE — Telephone Encounter (Signed)
Yes.  She can hold her Xarelto 2 days prior to the colonoscopy and resume after when the physician feels it is safe. Thank you, Richardson Dopp, PA-C   08/17/2016 5:26 PM

## 2016-08-18 NOTE — Telephone Encounter (Signed)
Patient notified per Richardson Dopp, PA to hold Xarelto 2 days prior to her procedure. Patient verbalized understanding.

## 2016-08-18 NOTE — Telephone Encounter (Signed)
Patient returned phone call. Best # [phone]

## 2016-08-18 NOTE — Telephone Encounter (Signed)
Left a message for patient to return my call. 

## 2016-08-19 ENCOUNTER — Encounter: Payer: Self-pay | Admitting: Pulmonary Disease

## 2016-08-19 ENCOUNTER — Ambulatory Visit: Payer: Medicare HMO

## 2016-08-19 ENCOUNTER — Ambulatory Visit (INDEPENDENT_AMBULATORY_CARE_PROVIDER_SITE_OTHER): Payer: Medicare HMO | Admitting: Pulmonary Disease

## 2016-08-19 ENCOUNTER — Ambulatory Visit (INDEPENDENT_AMBULATORY_CARE_PROVIDER_SITE_OTHER)
Admission: RE | Admit: 2016-08-19 | Discharge: 2016-08-19 | Disposition: A | Discharge status: Home / Self Care | Payer: Medicare HMO | Source: Ambulatory Visit | Attending: Pulmonary Disease | Admitting: Pulmonary Disease

## 2016-08-19 VITALS — BP 120/68 | HR 72 | Temp 97.0°F | Ht 65.0 in | Wt 125.4 lb

## 2016-08-19 DIAGNOSIS — R002 Palpitations: Secondary | ICD-10-CM

## 2016-08-19 DIAGNOSIS — Z86010 Personal history of colonic polyps: Secondary | ICD-10-CM

## 2016-08-19 DIAGNOSIS — K219 Gastro-esophageal reflux disease without esophagitis: Secondary | ICD-10-CM

## 2016-08-19 DIAGNOSIS — J301 Allergic rhinitis due to pollen: Secondary | ICD-10-CM

## 2016-08-19 DIAGNOSIS — I480 Paroxysmal atrial fibrillation: Secondary | ICD-10-CM | POA: Diagnosis not present

## 2016-08-19 DIAGNOSIS — K5730 Diverticulosis of large intestine without perforation or abscess without bleeding: Secondary | ICD-10-CM

## 2016-08-19 DIAGNOSIS — F411 Generalized anxiety disorder: Secondary | ICD-10-CM

## 2016-08-19 DIAGNOSIS — E559 Vitamin D deficiency, unspecified: Secondary | ICD-10-CM

## 2016-08-19 DIAGNOSIS — Z0000 Encounter for general adult medical examination without abnormal findings: Secondary | ICD-10-CM | POA: Diagnosis not present

## 2016-08-19 MED ORDER — ALPRAZOLAM 0.5 MG PO TABS
ORAL_TABLET | ORAL | 5 refills | Status: DC
Start: 2016-08-19 — End: 2017-04-26

## 2016-08-19 NOTE — Progress Notes (Signed)
Subjective:     Patient ID: Destiny Sanchez, female   DOB: 1940-10-04, 75 y.o.   MRN: [id number]  HPI 75 y/o WF here for a follow up visit... SEE PREV EPIC NOTES FOR OLDER DATA>>    2DEcho 04/2010 showed EF=60-65%, no RWMA, Gr1DD, PAsys=39mHg  LABS 6/13:  FLP- ok on diet x LDL=108;  Chems- wnl;  CBC- wnl;  TSH=3.53...  ~  July 18, 2013:  [place] still c/o palpitations- skips, irregularity, not racing/ lightheaded, dizzy, etc;  She notes that these often occur w/ activity (eg. walking, playing golf, when hot) & may be assoc w/ SOB;  She last saw DrMcLean in 2011 for similar symptoms- at that time she had a 2DEcho (norm LVF & valves), and a Holter (PACs & short run atrial tachy- no worrisome arrhythmias);  They rec elim of caffeine & low dose BBlocker- Metoprolol25- 1/2Bid... She is still quite concerned about these palpitations and we discussed follow up w/ Cards- DrMcLean w/ poss repeat holter/ event recorder & poss treadmill test...     We reviewed prob list, meds, xrays and labs> see below for updates >> she had the 2014 Flu vaccine 10/14...   EKG 11/14 showed NSR, rate70, sm Qs inferiorly, otherw NAD...   CXR 8/14 showed norm heart size, clear lungs, DJD in spine & osteopenia of bones...  LABS 8/14:  Chems- wnl;  CBC- wnl...   ~  August 16, 2104:  [place] [facility] cough, yellow sput, nasal drainage, & HA; she felt feverish w/ chills and [facility] her; the congestion has persisted & she is not resting;  She also reports a good bit of stress- in [id number] [place] & she had to do CPR, call 911, etc; he spent 2wks in hospw/ pacer/defib placed & he is improved- in rehab, playing golf again etc...     Glaucoma> on drops per ophthalmology & followed regularly...    AR, Bronchitis> Hx LPR w/ prev eval by DrByers, no longer taking PPI; uses OTC antihist as  needed; presented 12/15 w/ URI/ AB=> treated w/ Zithromax, Depo80, Pred taper, Mucinex, Fluids...    AtypCP, Palpit> followed by DrMcLean w/ PAF found on Holter done for palpit; [place] stress [place] 2014; [facility] reviewed    Borderline Chol> on diet alone; FLP 12/15 showed TChol 173, TG 93, HDL 44, LDL 111; rec to continue diet & exercise...    GI- GERD, Divertics, IBS, colon polyps> followed by DrStark- prev on Aciphex; last colon was 11/12 w/ divertics and sm adenoma removed..    Migraine HAs> she had prev eval by DrAdelman, & accupuncture treatment by DrBloom; last treated w/ zoloft from DrFreeman...    Anxiety> under some stress but she's declined anxiolytic rx...     DERM- she has lipoma on her back assessed by [facility] & had Ca on  Nose removed by Derm... We reviewed prob list, meds, xrays and labs> see below for updates >>   CXR 12/15 showed norm heart size, clear lungs/ NAD, some hyperinflation suggested by radiology...  LABS 12/15:  FLP- ok w/ LDL=111;  Chems- wnl;  CBC- wnl;  TSH= 3.32...  PLAN>>  she has Bronchitis & AB exac- we will Rx w/ the Zithromax, Deop80, Pred-3d taper, Mucinex, fluids, etc, continue other meds the same...  ~  August 06, 2015:  Yearly [place] reports a good year- no new complaints or concerns; her husb had sudden cardiac death at home & she did CPR, he was in a coma for 6-7d and is now doing remarkably well w/ an implanted defib now;  She has experienced some anxiety & we discussed Rx w/ alpraz 0.'5mg'$  1/2 to 1 tab Tid prn;  She also notes some left shoulder pain & we will set up an Ortho consult w/ DrWainer Harriett Sine...  We reviewed the following medical problems during today's office visit >> NOTE: one son has celiac dis and one son has DM.    Glaucoma> on drops per ophthalmology & followed regularly...    AR, Bronchitis> Hx LPR w/ prev eval by DrByers, no longer taking PPI; uses OTC antihist as needed; presented  12/15 w/ URI/ AB=> treated w/ Zithromax, Depo80, Pred taper, Mucinex, Fluids...    AtypCP, Palpit> followed by DrMcLean w/ PAF found on Holter done for palpit; on [place]; she had neg stress echo 2014; [facility]- note reviewed    Borderline Chol> on diet alone; FLP 12/16 showed TChol 176, TG 86, HDL 52, LDL 107; rec to continue diet & exercise...    GI- GERD, Divertics, IBS, colon polyps> followed by DrStark- prev on Aciphex; last colon was 11/12 w/ divertics and sm adenoma removed..    Migraine HAs> she had prev eval by DrAdelman, & accupuncture treatment by DrBloom; last treated w/ zoloft from DrFreeman...    Anxiety> under some stress but she's declined anxiolytic rx...     DERM- she has lipoma on her back assessed by [facility] & had Ca on nose removed by Derm... EXAM shows Afeb, VSS, O2sat=99% on RA;  HEENT- neg, mallampati2;  Chest- clear w/o w/r/r;  Heart- RR s4 w/o m/r;  Abd- soft, nontender, neg;  Ext- L shoulder pain, w/o c/c/e;  Neuro- intact...  LABS 08/05/16>  FLP- ok on diet alone w/ LDL=107;  Chems- wnl;  CBC- wnl;  TSH=4.69;  VitD=37 IMP/PLAN>>  Jatoria is doing satis- she has some anxiety & we dicussed Rx w/ Alpraz 0.'5mg'$  prn (he had a traumatic event w/ husb's SCD but she saved his life w/ CPR & her quick action; we gave her the 2016 FLU vaccine today; we will arrange for Ortho consult for left shoulder pain...   ~  August 19, 2016:  Yearly [place] is stable- notes some fatigue to go along w/ her 15 yrs, and says she noted some ankle edema last summer while on vacation in [place];  Reminded to start a regular exercise program like Silver Sneakers and to avoid salt/ sodium esp while on vacation, could also use support hose & elevation... She had several follow up visits this past yr>>    She saw CARDS-SWeaver,PA on 01/05/16>  DrMcLean is her cardiologist, hx palpit, PAF, HL; norm stress echo 11/14 and event recorder revealed several short runs of PAF;     She  saw GI-DrStark 08/17/16 (new pt 35mn appt)>  Hx GERD & adenomatous colon polyps but on Protonix & Xarelto for PAF; last colon 11/12 & they plan 5 yr f/u colon soon... We reviewed the following medical problems during today's office visit >>     Glaucoma> on drops per ophthalmology- DrMcCuen & followed regularly...    AR, Bronchitis> Hx LPR w/ prev eval by DrByers, on PPI- Protonix40 as needed; uses OTC antihist as needed; presented 12/15 w/ URI/ AB=> treated w/  Zithromax, Depo80, Pred taper, Mucinex, Fluids...    AtypCP, Palpit> followed by DrMcLean w/ PAF found on Holter done for palpit; on [place]; she had neg stress echo 2014; last seen by Cards 5/17 as above...    Borderline Chol> on diet alone; FLP 12/17 showed TChol 176, TG 106, HDL 55, LDL 99; rec to continue diet & exercise...    GI- GERD, Divertics, IBS, colon polyps> followed by DrStark- on PPI prn; last colon was 11/12 w/ divertics and sm adenoma removed=> they plan f/u colon soon    Migraine HAs> she had prev eval by DrAdelman, & accupuncture treatment by DrBloom; last treated w/ Zoloft from [place]...    Anxiety> under some stress w/ husb's cardiac issues etc; she has Xanax 0.'5mg'$  for prn use & takes it at bedtime for rest...    DERM- she has lipoma on her back assessed by [facility] & had Ca on nose removed by Derm... EXAM shows Afeb, VSS, O2sat=100% on RA;  HEENT- neg, mallampati2;  Chest- clear w/o w/r/r;  Heart- RR s4 w/o m/r;  Abd- soft, nontender, neg;  Ext- L shoulder pain, w/o c/c/e;  Neuro- intact...  EKG 01/05/16>  NSR, rate88, few PACs, otherw norm EKG...  CXR 08/19/16>  Norm heart size, sl hyperinflation- clear lungs, NAD...  LABS 08/20/16>  FLP- at goals on diet alone;  Chems- wnl;  CBC- wnl;  TSH=4.90 & stable, not on meds;  VitD=36 on 2000u daily supplement... IMP/PLAN>>  [place] is stable overall- continue same meds, no salt, & incr exercise program; we gave her the 2017 FLU vaccine today...           Problem List:    GLAUCOMA (ICD-365.9) - [facility] by DrWhitaker, on gtts...  ALLERGIC RHINITIS (ICD-477.9) - has seen DrByers for ENT w/ LER... Uses OTC antihist as needed...  PALPITATIONS (ICD-785.1) >> on METOPROLOL '25mg'$ - 1/2 bid per DrMcLean... ~  normal 2DEcho 1993...  ~  negative Stress Cardiolite 2003 without ischemia and EF=75%... ~  Eval by Cards 2011- DrMcLean>  2DEcho & holter below- started on Metoprolol '25mg'$ - 1/2 Bid & asked to avoid caffeine etc... ~  2DEcho 9/11 showed normal cavity size, normal sys function w/ EF= 60-65%, no regional wall motion problems, Gr1DD ~  Holter monitor 2011 showed PACs & short run (5beats) of atrial tachy... ~  2014: repeat Cards eval w/ neg stress echo but Holter showed several short runs of AFib; DrMcLean Rx w/ MetopER25Bid & Eugenio Hoes... ~  2015: stable on Metoprolol ER 25Bid & Eugenio Hoes; she sees DrMcLean Q56mo& stable...  CHEST PAIN, ATYPICAL (ICD-786.59) ~  CXR 7/11 showed normal heart size, clear lungs, mild osteopenia... ~  EKG 9/11 showed NSR, rate76, poss LAE, no STTWA, borderline tracing... ~  EKG 11/14 showed NSR, rate70, sm Qs inferiorly, otherw NAD... ~  Stress Echo 2014 was neg...   CHOLESTEROL >> on diet alone... ~  FLP 7/11 showed TChol 201, TG 62, HDL 60, LDL 127 ~  FLP 6/13 showed TChol 189, TG 62, HDL 69, LDL 108 ~  FLP 12/15 showed TChol 173, TG 93, HDL 44, LDL 111   GERD (ICD-530.81) - GI eval DrStark w/ GERD, LER, & Globus phenom... controlled w/ ACIPHEX which she prefers to the other PPIs... (Nexium & Omep without benefit)...  DIVERTICULOSIS >> IRRITABLE BOWEL SYNDROME >> COLON POLYPS >> colonoscopy 11/07 by DrStark showed 56mpolyp (tubular adenoma) f/u planned 5 yrs. ~  She had f/u colonoscopy 11/12 by DrStark w/ divertics &  2 sm polypd removed from transverse colon; path= tubular adenoma & f/u planned 5 yrs.  Hx of MIGRAINE HEADACHE (ICD-346.90) - Hx myofascial pain as well w/ prev accupuncture therapy in  2003 by DrBloom... prev headache eval in the [facility]... ~  Now followed by DrFreeman she reports Rx for Prn Imitrex & ZOLOFT '50mg'$ /d...  ANXIETY (ICD-300.00) ~  She had a good bit of stress 7/15 w/ her husb cardiac arrest, she did CPR & called 911, he spent 2wks in hosp but survived now w/ defib/pacer & in cardiac rehab...  Health Maintenance -  ~  GI:  Followed by DrStark  ~  GYN: DrDickstein in the past for PAP, Mammogram, BMD... ~  Immunizations:  She is encouraged to get the yearly Flu vaccine;  She had the Pneumovax several yrs ago;  She had TDAP 1/13;  We discussed the Shingles vaccine...   Past Medical History:  Diagnosis Date  . Adenomatous colon polyp 06/2006  . Allergy   . Anxiety   . Blood transfusion 1967 &1971  . Cancer ([place])    basal cell on nose  . GERD (gastroesophageal reflux disease)   . Glaucoma   . History of migraine   . Hx of cardiovascular stress test    a. ETT-Echo (07/2013):  Normal. //  b. ETT-Echo 6/17 - Normal study after maximal exercise  . IBS (irritable bowel syndrome)   . Osteopenia   . SVT (supraventricular tachycardia) (HCC)     Past Surgical History:  Procedure Laterality Date  . ABDOMINAL HYSTERECTOMY  1975  . ABDOMINAL HYSTERECTOMY    . CATARACT EXTRACTION W/ INTRAOCULAR LENS  IMPLANT, BILATERAL    . HEMORROIDECTOMY  1973  . MOHS SURGERY    . PARTIAL NEPHRECTOMY  1971   diverticulum on left    Outpatient Encounter Prescriptions as of 08/19/2016  Medication Sig Dispense Refill  . ALPRAZolam (XANAX) 0.5 MG tablet Take 1/2 to 1 tablet by mouth three times daily 90 tablet 5  . brinzolamide (AZOPT) 1 % ophthalmic suspension Place 1 drop into both eyes 3 (three) times daily.     . Calcium Carbonate-Vitamin D (CALTRATE 600+D PO) Take 1,200 mg by mouth daily.      . Cholecalciferol (VITAMIN D) 2000 UNITS tablet Take 2,000 Units by mouth daily.      Marland Kitchen latanoprost (XALATAN) 0.005 % ophthalmic solution Place 1 drop into both eyes at  bedtime.    . metoprolol succinate (TOPROL-XL) 25 MG 24 hr tablet TAKE ONE TABLET BY MOUTH TWICE DAILY. 180 tablet 3  . pantoprazole (PROTONIX) 40 MG tablet Take 1 tablet (40 mg total) by mouth as directed. DAILY FOR 2 WEEKS THEN AS NEEDED 30 tablet 11  . rivaroxaban (XARELTO) 20 MG TABS tablet TAKE ONE TABLET BY MOUTH ONCE DAILY WITH  SUPPER. 90 tablet 3  . [DISCONTINUED] ALPRAZolam (XANAX) 0.5 MG tablet Take 1 tablet (0.5 mg total) by mouth 3 (three) times daily as needed for anxiety. 90 tablet 0   No facility-administered encounter medications on file as of 08/19/2016.     No Known Allergies   Current Medications, Allergies, Past Medical History, Past Surgical History, Family History, and Social History were reviewed in [employer] record.   Review of Systems         The patient complains of occas palpitations.  The patient denies fever, chills, sweats, anorexia, weakness, weight loss, sleep disorder, blurring, diplopia, eye irritation, eye discharge, vision loss, eye pain, photophobia, earache, ear  discharge, tinnitus, decreased hearing, nasal congestion, nosebleeds, sore throat, hoarseness, chest pain, syncope, dyspnea on exertion, orthopnea, PND, peripheral edema, cough, dyspnea at rest, excessive sputum, hemoptysis, wheezing, pleurisy, nausea, vomiting, constipation, melena, hematochezia, jaundice, gas/bloating, indigestion/heartburn, dysphagia, odynophagia, dysuria, hematuria, urinary frequency, urinary hesitancy, nocturia, incontinence, back pain, joint pain, joint swelling, muscle weakness, stiffness, arthritis, sciatica, restless legs, leg pain at night, leg pain with exertion, rash, itching, dryness, suspicious lesions, paralysis, paresthesias, seizures, tremors, vertigo, transient blindness, frequent falls, difficulty walking, depression, anxiety, memory loss, confusion, cold intolerance, heat intolerance, polydipsia, polyphagia, polyuria, unusual weight change,  abnormal bruising, bleeding, enlarged lymph nodes, urticaria, allergic rash, hay fever, and recurrent infections.  Note- all other systems neg except as noted...   Objective:   Physical Exam    WD, WN, 75 y/o WF in NAD... GENERAL:  Alert & oriented; pleasant & cooperative. HEENT:  [place]/AT, EOM-wnl, PERRLA, Fundi-benign, EACs-clear, TMs-wnl, NOSE-clear, THROAT-clear & wnl. NECK:  Supple w/ full ROM; no JVD; normal carotid impulses w/o bruits; no thyromegaly or nodules palpated; no lymphadenopathy. CHEST:  Clear w/o wheezing/ rales/ rhonchi... HEART:  Regular Rhythm; without murmurs/ rubs/ or gallops. ABDOMEN:  Soft & nontender; normal bowel sounds; no organomegaly or masses detected. EXT: without deformities or arthritic changes; no varicose veins/ venous insuffic/ or edema, pulses intact... NEURO:  CN's intact; motor testing normal; sensory testing normal; gait normal & balance OK. DERM:  No lesions noted; no rash etc...  RADIOLOGY DATA:  Reviewed in the EPIC EMR & discussed w/ the patient...  LABORATORY DATA:  Reviewed in the EPIC EMR & discussed w/ the patient...   Assessment:      08/19/16>   [place] is stable overall- continue same meds, no salt, & incr exercise program; we gave her the 2017 FLU vaccine today...   Hx Acute bronchitis w/ airway inflamm>  Prev treated w/ Zithromax, Pred, Mucinex Fluids...  Glaucoma>  [facility]Whitaker [facility]...  AR>  She uses OTC Antihist etc as needed...  Hx AtypCP, Palpit>  Followed by DrMcLean & on Metoprolol '25mg'$ - 1/2 Bid w/ continued symptoms; we discussed incr to 1 tab Bid & f/u Cards...  Borderline Chol>  LDL was 111, on diet + exercise...  GERD>  Reminded to take PPI vs H2Blocker as needed for symptoms...  Divertics, IBS, Polyps>  Followed by DrStark & up to date on colonoscopy...  Hx Migraine HAs>  She has Imitrex for prn use from DrFreeman...  Hx Anxiety> she takes Zoloft from [employer]...     Plan:      Patient's Medications  New Prescriptions   No medications on file  Previous Medications   BRINZOLAMIDE (AZOPT) 1 % OPHTHALMIC SUSPENSION    Place 1 drop into both eyes 3 (three) times daily.    CALCIUM CARBONATE-VITAMIN D (CALTRATE 600+D PO)    Take 1,200 mg by mouth daily.     CHOLECALCIFEROL (VITAMIN D) 2000 UNITS TABLET    Take 2,000 Units by mouth daily.     LATANOPROST (XALATAN) 0.005 % OPHTHALMIC SOLUTION    Place 1 drop into both eyes at bedtime.   METOPROLOL SUCCINATE (TOPROL-XL) 25 MG 24 HR TABLET    TAKE ONE TABLET BY MOUTH TWICE DAILY.   PANTOPRAZOLE (PROTONIX) 40 MG TABLET    Take 1 tablet (40 mg total) by mouth as directed. DAILY FOR 2 WEEKS THEN AS NEEDED   RIVAROXABAN (XARELTO) 20 MG TABS TABLET    TAKE ONE TABLET BY MOUTH ONCE DAILY WITH  SUPPER.  Modified Medications   Modified Medication Previous Medication   ALPRAZOLAM (XANAX) 0.5 MG TABLET ALPRAZolam (XANAX) 0.5 MG tablet      Take 1/2 to 1 tablet by mouth three times daily    Take 1 tablet (0.5 mg total) by mouth 3 (three) times daily as needed for anxiety.  Discontinued Medications   No medications on file

## 2016-08-19 NOTE — Patient Instructions (Signed)
Today we updated your med list in our EPIC system...    Continue your current medications the same...  Today we did your follow up CXR... Please return to our lab in the AM for your FASTING blood work...    We will contact you w/ the results when available...   Keep up the good work w/ your low chol, low sodium diet!  Stay as active as possible...  Call for any questions...  Let's plan a follow up visit in 30yr, sooner if needed for acute problems.Marland KitchenMarland Kitchen

## 2016-08-20 ENCOUNTER — Other Ambulatory Visit (INDEPENDENT_AMBULATORY_CARE_PROVIDER_SITE_OTHER): Payer: Medicare HMO

## 2016-08-20 DIAGNOSIS — R69 Illness, unspecified: Secondary | ICD-10-CM | POA: Diagnosis not present

## 2016-08-20 DIAGNOSIS — F411 Generalized anxiety disorder: Secondary | ICD-10-CM | POA: Diagnosis not present

## 2016-08-20 DIAGNOSIS — R002 Palpitations: Secondary | ICD-10-CM | POA: Diagnosis not present

## 2016-08-20 DIAGNOSIS — I480 Paroxysmal atrial fibrillation: Secondary | ICD-10-CM

## 2016-08-20 DIAGNOSIS — E559 Vitamin D deficiency, unspecified: Secondary | ICD-10-CM | POA: Diagnosis not present

## 2016-08-20 LAB — LIPID PANEL
CHOL/HDL RATIO: 3
Cholesterol: 176 mg/dL (ref 0–200)
HDL: 55.4 mg/dL (ref >39.00)
LDL CALC: 99 mg/dL (ref 0–99)
NONHDL: 120.64
Triglycerides: 106 mg/dL (ref 0.0–149.0)
VLDL: 21.2 mg/dL (ref 0.0–40.0)

## 2016-08-20 LAB — COMPREHENSIVE METABOLIC PANEL
ALT: 15 U/L (ref 0–35)
AST: 16 U/L (ref 0–37)
Albumin: 4.2 g/dL (ref 3.5–5.2)
Alkaline Phosphatase: 45 U/L (ref 39–117)
BUN: 19 mg/dL (ref 6–23)
CALCIUM: 9.3 mg/dL (ref 8.4–10.5)
CHLORIDE: 107 meq/L (ref 96–112)
CO2: 28 meq/L (ref 19–32)
CREATININE: 0.59 mg/dL (ref 0.40–1.20)
GFR: 104.84 mL/min (ref >60.00)
Glucose, Bld: 96 mg/dL (ref 70–99)
Potassium: 4.1 mEq/L (ref 3.5–5.1)
SODIUM: 142 meq/L (ref 135–145)
Total Bilirubin: 0.8 mg/dL (ref 0.2–1.2)
Total Protein: 6.5 g/dL (ref 6.0–8.3)

## 2016-08-20 LAB — CBC WITH DIFFERENTIAL/PLATELET
BASOS ABS: 0 10*3/uL (ref 0.0–0.1)
Basophils Relative: 0.5 % (ref 0.0–3.0)
EOS ABS: 0.2 10*3/uL (ref 0.0–0.7)
Eosinophils Relative: 2.5 % (ref 0.0–5.0)
HEMATOCRIT: 40.5 % (ref 36.0–46.0)
HEMOGLOBIN: 13.9 g/dL (ref 12.0–15.0)
LYMPHS PCT: 20.9 % (ref 12.0–46.0)
Lymphs Abs: 1.4 10*3/uL (ref 0.7–4.0)
MCHC: 34.2 g/dL (ref 30.0–36.0)
MCV: 95.2 fl (ref 78.0–100.0)
MONO ABS: 0.5 10*3/uL (ref 0.1–1.0)
Monocytes Relative: 7.4 % (ref 3.0–12.0)
Neutro Abs: 4.6 10*3/uL (ref 1.4–7.7)
Neutrophils Relative %: 68.7 % (ref 43.0–77.0)
Platelets: 421 10*3/uL — ABNORMAL HIGH (ref 150.0–400.0)
RBC: 4.26 Mil/uL (ref 3.87–5.11)
RDW: 13.7 % (ref 11.5–15.5)
WBC: 6.7 10*3/uL (ref 4.0–10.5)

## 2016-08-20 LAB — TSH: TSH: 4.9 u[IU]/mL — AB (ref 0.35–4.50)

## 2016-08-20 LAB — VITAMIN D 25 HYDROXY (VIT D DEFICIENCY, FRACTURES): VITD: 35.76 ng/mL (ref 30.00–100.00)

## 2016-09-13 ENCOUNTER — Other Ambulatory Visit: Payer: Self-pay | Admitting: Physician Assistant

## 2016-09-13 DIAGNOSIS — I480 Paroxysmal atrial fibrillation: Secondary | ICD-10-CM

## 2016-09-13 NOTE — Telephone Encounter (Signed)
Pt saw Richardson Dopp, [place] on 01/05/16, BMP and CBC on 08/20/16 Creat 0.59, Age 76 y/o, weight 56.9 kg, CrCl 71.73, Xarelto 20mg  QD appropriate dosage will refill x 6 months.

## 2016-09-29 DIAGNOSIS — M25561 Pain in right knee: Secondary | ICD-10-CM | POA: Diagnosis not present

## 2016-09-29 DIAGNOSIS — G8929 Other chronic pain: Secondary | ICD-10-CM | POA: Diagnosis not present

## 2016-10-07 DIAGNOSIS — M810 Age-related osteoporosis without current pathological fracture: Secondary | ICD-10-CM | POA: Diagnosis not present

## 2016-10-07 DIAGNOSIS — Z803 Family history of malignant neoplasm of breast: Secondary | ICD-10-CM | POA: Diagnosis not present

## 2016-10-07 DIAGNOSIS — Z1231 Encounter for screening mammogram for malignant neoplasm of breast: Secondary | ICD-10-CM | POA: Diagnosis not present

## 2016-10-07 DIAGNOSIS — M8589 Other specified disorders of bone density and structure, multiple sites: Secondary | ICD-10-CM | POA: Diagnosis not present

## 2016-10-11 ENCOUNTER — Encounter: Payer: Self-pay | Admitting: Gastroenterology

## 2016-10-21 ENCOUNTER — Encounter: Payer: Self-pay | Admitting: Cardiology

## 2016-10-21 ENCOUNTER — Ambulatory Visit (INDEPENDENT_AMBULATORY_CARE_PROVIDER_SITE_OTHER): Payer: Medicare HMO | Admitting: Cardiology

## 2016-10-21 VITALS — BP 136/84 | HR 78 | Ht 65.0 in | Wt 125.8 lb

## 2016-10-21 DIAGNOSIS — R600 Localized edema: Secondary | ICD-10-CM | POA: Diagnosis not present

## 2016-10-21 DIAGNOSIS — R002 Palpitations: Secondary | ICD-10-CM | POA: Diagnosis not present

## 2016-10-21 DIAGNOSIS — I480 Paroxysmal atrial fibrillation: Secondary | ICD-10-CM

## 2016-10-21 DIAGNOSIS — R0602 Shortness of breath: Secondary | ICD-10-CM

## 2016-10-21 MED ORDER — RIVAROXABAN 20 MG PO TABS: 20.0000 mg | ORAL_TABLET | Freq: Every day | ORAL | 3 refills | Status: DC

## 2016-10-21 MED ORDER — METOPROLOL SUCCINATE ER 25 MG PO TB24: 25.0000 mg | ORAL_TABLET | Freq: Two times a day (BID) | ORAL | 3 refills | Status: DC

## 2016-10-21 MED ORDER — FUROSEMIDE 20 MG PO TABS: 20.0000 mg | ORAL_TABLET | Freq: Every day | ORAL | 11 refills | Status: DC | PRN

## 2016-10-21 NOTE — Patient Instructions (Signed)
Medication Instructions:  1) START LASIX 20 mg daily AS NEEDED for swelling  Labwork: None  Testing/Procedures: Your physician has requested that you have an echocardiogram. Echocardiography is a painless test that uses sound waves to create images of your heart. It provides your doctor with information about the size and shape of your heart and how well your heart's chambers and valves are working. This procedure takes approximately one hour. There are no restrictions for this procedure.   Your physician has recommended that you wear an event monitor. Event monitors are medical devices that record the heart's electrical activity. Doctors most often [place] these monitors to diagnose arrhythmias. Arrhythmias are problems with the speed or rhythm of the heartbeat. The monitor is a small, portable device. You can wear one while you do your normal daily activities. This is usually used to diagnose what is causing palpitations/syncope (passing out).  Follow-Up: Your physician wants you to follow-up in: 6 months with Dr. Radford Pax. You will receive a reminder letter in the mail two months in advance. If you don't receive a letter, please call our office to schedule the follow-up appointment.   Any Other Special Instructions Will Be Listed Below (If Applicable).     If you need a refill on your cardiac medications before your next appointment, please call your pharmacy.

## 2016-10-21 NOTE — Progress Notes (Signed)
Cardiology Office Note    Date:  10/21/2016   ID:  Destiny Sanchez, DOB 1941/05/17, MRN [id number]  PCP:  Noralee Space, MD  Cardiologist:  Fransico Him, MD   Chief Complaint  Patient presents with  . Atrial Fibrillation    History of Present Illness:  Destiny Sanchez is a 76 y.o. female with a hx of  HL,GERD, palpitations and paroxysmal atrial fibrillation.  She is on  on Xarelto 20 mg daily for a CHADS2VASC score of 3.  She exercises several times a week at [employer] and gets SOB doing her workout. She saw Richardson Dopp, PA last Spring for  chest pain and SOB as well as problems with feeling achy all over and it was felt that she had a viral syndrome.  She underwent stress echo showing no ischemia. She is doing well today.  She denies any chest pain, LE edema, PND, orthopnea, dizziness or syncope. Occasionally she will notice some skipping of her heart beat.  She went to [place] last July and had an episode on the second day after arrival where she had sudden onset of dizziness and tunnel vision which resolved after sitting down.  Her daughter in law and son are both MDs and felt she was dehydrated from the plane ride.  She has not had any other episodes like that since.  She also had some problems with LE edema while in [place] which resolved a few days after she got back.  She says that she likes salt and tends to eat salty food.    Past Medical History:  Diagnosis Date  . Adenomatous colon polyp 06/2006  . Allergy   . Anxiety   . Blood transfusion 1967 &1971  . Cancer ([facility])    basal cell on nose  . GERD (gastroesophageal reflux disease)   . Glaucoma   . History of migraine   . Hx of cardiovascular stress test    a. ETT-Echo (07/2013):  Normal. //  b. ETT-Echo 6/17 - Normal study after maximal exercise  . IBS (irritable bowel syndrome)   . Osteopenia   . Persistent atrial fibrillation [facility])     Past Surgical History:  Procedure Laterality Date  . ABDOMINAL  HYSTERECTOMY  1975  . ABDOMINAL HYSTERECTOMY    . CATARACT EXTRACTION W/ INTRAOCULAR LENS  IMPLANT, BILATERAL    . HEMORROIDECTOMY  1973  . MOHS SURGERY    . PARTIAL NEPHRECTOMY  1971   diverticulum on left    Current Medications: Current Meds  Medication Sig  . ALPRAZolam (XANAX) 0.5 MG tablet Take 1/2 to 1 tablet by mouth three times daily  . brinzolamide (AZOPT) 1 % ophthalmic suspension Place 1 drop into both eyes 3 (three) times daily.   . Calcium Carbonate-Vitamin D (CALTRATE 600+D PO) Take 1,200 mg by mouth daily.    . Cholecalciferol (VITAMIN D) 2000 UNITS tablet Take 2,000 Units by mouth daily.    Marland Kitchen latanoprost (XALATAN) 0.005 % ophthalmic solution Place 1 drop into both eyes at bedtime.  . metoprolol succinate (TOPROL-XL) 25 MG 24 hr tablet Take 1 tablet (25 mg total) by mouth 2 (two) times daily.  . pantoprazole (PROTONIX) 40 MG tablet Take 1 tablet (40 mg total) by mouth as directed. DAILY FOR 2 WEEKS THEN AS NEEDED  . rivaroxaban (XARELTO) 20 MG TABS tablet Take 1 tablet (20 mg total) by mouth daily with supper.  . [DISCONTINUED] metoprolol succinate (TOPROL-XL) 25 MG 24 hr tablet TAKE ONE TABLET BY MOUTH  TWICE DAILY.  . [DISCONTINUED] XARELTO 20 MG TABS tablet TAKE ONE TABLET BY MOUTH ONCE DAILY WITH SUPPER.    Allergies:   Patient has no known allergies.   Social History   Social History  . Marital status: Married    Spouse name: N/A  . Number of children: N/A  . Years of education: N/A   Social History Main Topics  . Smoking status: Former Smoker    Packs/day: 1.00    Years: 10.00    Types: Cigarettes    Quit date: 08/30/1962  . Smokeless tobacco: Never Used  . Alcohol use 1.8 oz/week    3 Glasses of wine per week  . Drug use: No  . Sexual activity: Not Asked   Other Topics Concern  . None   Social History Narrative  . None     Family History:  The patient's family history includes Colon cancer in her maternal aunt; Throat cancer in her father.    ROS:   Please see the history of present illness.    ROS All other systems reviewed and are negative.  No flowsheet data found.     PHYSICAL EXAM:   VS:  BP 136/84 Comment: Automatic cuff Right arm: 132/69  Pulse 78   Ht 5\' 5"  (1.651 m)   Wt 125 lb 12.8 oz (57.1 kg)   SpO2 99%   BMI 20.93 kg/m    GEN: Well nourished, well developed, in no acute distress  HEENT: normal  Neck: no JVD, carotid bruits, or masses Cardiac: RRR; no murmurs, rubs, or gallops,no edema.  Intact distal pulses bilaterally.  Respiratory:  clear to auscultation bilaterally, normal work of breathing GI: soft, nontender, nondistended, + BS MS: no deformity or atrophy  Skin: warm and dry, no rash Neuro:  Alert and Oriented x 3, Strength and sensation are intact Psych: euthymic mood, full affect  Wt Readings from Last 3 Encounters:  10/21/16 125 lb 12.8 oz (57.1 kg)  08/19/16 125 lb 6 oz (56.9 kg)  01/05/16 127 lb (57.6 kg)      Studies/Labs Reviewed:   EKG:  EKG is not ordered today.   Recent Labs: 01/05/2016: Magnesium 2.3 08/20/2016: ALT 15; BUN 19; Creatinine, Ser 0.59; Hemoglobin 13.9; Platelets 421.0; Potassium 4.1; Sodium 142; TSH 4.90   Lipid Panel    Component Value Date/Time   CHOL 176 08/20/2016 0749   TRIG 106.0 08/20/2016 0749   HDL 55.40 08/20/2016 0749   CHOLHDL 3 08/20/2016 0749   VLDL 21.2 08/20/2016 0749   LDLCALC 99 08/20/2016 0749   LDLDIRECT 127.0 03/11/2010 0910    Additional studies/ records that were reviewed today include:  none    ASSESSMENT:    1. Paroxysmal atrial fibrillation (HCC)   2. Shortness of breath   3. Palpitations   4. Edema extremities      PLAN:  In order of problems listed above:  1. Persistent atrial fibrillation maintaining NSR.  She is tolerating the BB and Xarelto with no problems.  She will continue Xarelto and BB at current dose. Recent BMET and CBC normal.   2. SOB - this may be due to diastolic dysfunction or silent PAF that  she is unaware of.  Her stress echo showed no ischemia less than a year ago.  I will get a 2D echo to assess valvular structure and diastolic function.  She recently had a TSH and CBC that were normal.  If her echo shows diastolic dysfunction then will place on lasix  daily.  3. Palpitations - not sure if this is recurrent PAF.  I will get an event monitor to assess her PAF load. 4. LE edema - she has none on exam today.  I have encouraged her to follow a low sodium diet.  I will give her a Rx for lasix 20mg  to take daily as needed.      Medication Adjustments/Labs and Tests Ordered: Current medicines are reviewed at length with the patient today.  Concerns regarding medicines are outlined above.  Medication changes, Labs and Tests ordered today are listed in the Patient Instructions below.  Patient Instructions  Medication Instructions:  1) START LASIX 20 mg daily AS NEEDED for swelling  Labwork: None  Testing/Procedures: Your physician has requested that you have an echocardiogram. Echocardiography is a painless test that uses sound waves to create images of your heart. It provides your doctor with information about the size and shape of your heart and how well your heart's chambers and valves are working. This procedure takes approximately one hour. There are no restrictions for this procedure.   Your physician has recommended that you wear an event monitor. Event monitors are medical devices that record the heart's electrical activity. Doctors most often [place] these monitors to diagnose arrhythmias. Arrhythmias are problems with the speed or rhythm of the heartbeat. The monitor is a small, portable device. You can wear one while you do your normal daily activities. This is usually used to diagnose what is causing palpitations/syncope (passing out).  Follow-Up: Your physician wants you to follow-up in: 6 months with Dr. Radford Pax. You will receive a reminder letter in the mail two months in  advance. If you don't receive a letter, please call our office to schedule the follow-up appointment.   Any Other Special Instructions Will Be Listed Below (If Applicable).     If you need a refill on your cardiac medications before your next appointment, please call your pharmacy.      Signed, Fransico Him, MD  10/21/2016 1:12 PM    [facility] [address], [place], [place]  [address] Phone: [phone]; Fax: [phone]

## 2016-10-25 ENCOUNTER — Ambulatory Visit (AMBULATORY_SURGERY_CENTER): Payer: Medicare HMO | Admitting: Gastroenterology

## 2016-10-25 ENCOUNTER — Encounter: Payer: Self-pay | Admitting: Gastroenterology

## 2016-10-25 VITALS — BP 126/43 | HR 76 | Temp 97.5°F | Resp 11 | Ht 65.0 in | Wt 125.0 lb

## 2016-10-25 DIAGNOSIS — D124 Benign neoplasm of descending colon: Secondary | ICD-10-CM | POA: Diagnosis not present

## 2016-10-25 DIAGNOSIS — D128 Benign neoplasm of rectum: Secondary | ICD-10-CM

## 2016-10-25 DIAGNOSIS — Z86010 Personal history of colonic polyps: Secondary | ICD-10-CM | POA: Diagnosis not present

## 2016-10-25 DIAGNOSIS — D123 Benign neoplasm of transverse colon: Secondary | ICD-10-CM

## 2016-10-25 DIAGNOSIS — I10 Essential (primary) hypertension: Secondary | ICD-10-CM | POA: Diagnosis not present

## 2016-10-25 DIAGNOSIS — I4891 Unspecified atrial fibrillation: Secondary | ICD-10-CM | POA: Diagnosis not present

## 2016-10-25 DIAGNOSIS — D129 Benign neoplasm of anus and anal canal: Secondary | ICD-10-CM

## 2016-10-25 DIAGNOSIS — K219 Gastro-esophageal reflux disease without esophagitis: Secondary | ICD-10-CM | POA: Diagnosis not present

## 2016-10-25 MED ORDER — SODIUM CHLORIDE 0.9 % IV SOLN: 500.0000 mL | INTRAVENOUS | Status: AC

## 2016-10-25 NOTE — Patient Instructions (Signed)
Impression/Recommendations:  Polyp handout given to patient. Diverticulosis handout given to patient.  Resume Xarelto tomorrow at prior dose.  YOU HAD AN ENDOSCOPIC PROCEDURE TODAY AT [facility]:   Refer to the procedure report that was given to you for any specific questions about what was found during the examination.  If the procedure report does not answer your questions, please call your gastroenterologist to clarify.  If you requested that your care partner not be given the details of your procedure findings, then the procedure report has been included in a sealed envelope for you to review at your convenience later.  YOU SHOULD EXPECT: Some feelings of bloating in the abdomen. Passage of more gas than usual.  Walking can help get rid of the air that was put into your GI tract during the procedure and reduce the bloating. If you had a lower endoscopy (such as a colonoscopy or flexible sigmoidoscopy) you may notice spotting of blood in your stool or on the toilet paper. If you underwent a bowel prep for your procedure, you may not have a normal bowel movement for a few days.  Please Note:  You might notice some irritation and congestion in your nose or some drainage.  This is from the oxygen used during your procedure.  There is no need for concern and it should clear up in a day or so.  SYMPTOMS TO REPORT IMMEDIATELY:   Following lower endoscopy (colonoscopy or flexible sigmoidoscopy):  Excessive amounts of blood in the stool  Significant tenderness or worsening of abdominal pains  Swelling of the abdomen that is new, acute  Fever of 100F or higher For urgent or emergent issues, a gastroenterologist can be reached at any hour by calling [phone].   DIET:  We do recommend a small meal at first, but then you may proceed to your regular diet.  Drink plenty of fluids but you should avoid alcoholic beverages for 24 hours.  ACTIVITY:  You should plan to take it easy  for the rest of today and you should NOT DRIVE or use heavy machinery until tomorrow (because of the sedation medicines used during the test).    FOLLOW UP: Our staff will call the number listed on your records the next business day following your procedure to check on you and address any questions or concerns that you may have regarding the information given to you following your procedure. If we do not reach you, we will leave a message.  However, if you are feeling well and you are not experiencing any problems, there is no need to return our call.  We will assume that you have returned to your regular daily activities without incident.  If any biopsies were taken you will be contacted by phone or by letter within the next 1-3 weeks.  Please call us at [phone] if you have not heard about the biopsies in 3 weeks.    SIGNATURES/CONFIDENTIALITY: You and/or your care partner have signed paperwork which will be entered into your electronic medical record.  These signatures attest to the fact that that the information above on your After Visit Summary has been reviewed and is understood.  Full responsibility of the confidentiality of this discharge information lies with you and/or your care-partner.

## 2016-10-25 NOTE — Op Note (Signed)
[facility] Patient Name: Destiny Sanchez Procedure Date: 10/25/2016 8:05 AM MRN: [id number] Endoscopist: Ladene Artist , MD Age: 76 Referring MD:  Date of Birth: [birth year] Gender: Female Account #: [id number] Procedure:                Colonoscopy Indications:              Surveillance: Personal history of adenomatous                            polyps on last colonoscopy > 5 years ago Medicines:                Monitored Anesthesia Care Procedure:                Pre-Anesthesia Assessment:                           - Prior to the procedure, a History and Physical                            was performed, and patient medications and                            allergies were reviewed. The patient's tolerance of                            previous anesthesia was also reviewed. The risks                            and benefits of the procedure and the sedation                            options and risks were discussed with the patient.                            All questions were answered, and informed consent                            was obtained. Prior Anticoagulants: The patient has                            taken Xarelto (rivaroxaban), last dose was 2 days                            prior to procedure. ASA Grade Assessment: III - A                            patient with severe systemic disease. After                            reviewing the risks and benefits, the patient was                            deemed in satisfactory condition to undergo the  procedure.                           After obtaining informed consent, the colonoscope                            was passed under direct vision. Throughout the                            procedure, the patient's blood pressure, pulse, and                            oxygen saturations were monitored continuously. The                            Model [id number]-[id number]) scope was introduced                             through the anus and advanced to the the cecum,                            identified by appendiceal orifice and ileocecal                            valve. The ileocecal valve, appendiceal orifice,                            and rectum were photographed. The quality of the                            bowel preparation was good. The colonoscopy was                            performed without difficulty. The patient tolerated                            the procedure well. Scope In: 8:07:51 AM Scope Out: 8:21:11 AM Scope Withdrawal Time: 0 hours 10 minutes 18 seconds  Total Procedure Duration: 0 hours 13 minutes 20 seconds  Findings:                 The perianal and digital rectal examinations were                            normal.                           Three sessile polyps were found in the rectum,                            descending colon and transverse colon. The polyps                            were 6 mm in size. These polyps were removed with a  cold snare. Resection and retrieval were complete.                           A few small-mouthed diverticula were found in the                            sigmoid colon.                           The exam was otherwise without abnormality on                            direct and retroflexion views. Complications:            No immediate complications. Estimated blood loss:                            None. Estimated Blood Loss:     Estimated blood loss: none. Impression:               - Three 6 mm polyps in the rectum, in the                            descending colon and in the transverse colon,                            removed with a cold snare. Resected and retrieved.                           - Diverticulosis in the sigmoid colon.                           - The examination was otherwise normal on direct                            and retroflexion views. Recommendation:            - Resume Xarelto (rivaroxaban) tomorrow at prior                            dose. Refer to managing physician for further                            adjustment of therapy.                           - Patient has a contact number available for                            emergencies. The signs and symptoms of potential                            delayed complications were discussed with the                            patient. Return to normal activities tomorrow.  Written discharge instructions were provided to the                            patient.                           - Resume previous diet.                           - Continue present medications.                           - Await pathology results.                           - No repeat colonoscopy due to age. Ladene Artist, MD 10/25/2016 8:24:38 AM This report has been signed electronically.

## 2016-10-25 NOTE — Progress Notes (Signed)
Report to PACU, RN, vss, BBS= Clear.  

## 2016-10-25 NOTE — Progress Notes (Signed)
Called to room to assist during endoscopic procedure.  Patient ID and intended procedure confirmed with present staff. Received instructions for my participation in the procedure from the performing physician.  

## 2016-10-26 ENCOUNTER — Telehealth: Payer: Self-pay

## 2016-10-26 NOTE — Telephone Encounter (Signed)
  Follow up Call-  Call back number 10/25/2016  Post procedure Call Back phone  # [phone] cell  Permission to leave phone message Yes  Some recent data might be hidden     Patient questions:  Do you have a fever, pain , or abdominal swelling? No. Pain Score  0 *  Have you tolerated food without any problems? Yes.    Have you been able to return to your normal activities? Yes.    Do you have any questions about your discharge instructions: Diet   No. Medications  No. Follow up visit  No.  Do you have questions or concerns about your Care? No.  Actions: * If pain score is 4 or above: No action needed, pain <4.

## 2016-11-11 ENCOUNTER — Encounter: Payer: Self-pay | Admitting: Gastroenterology

## 2016-11-15 DIAGNOSIS — Z6820 Body mass index (BMI) 20.0-20.9, adult: Secondary | ICD-10-CM | POA: Diagnosis not present

## 2016-11-15 DIAGNOSIS — R87610 Atypical squamous cells of undetermined significance on cytologic smear of cervix (ASC-US): Secondary | ICD-10-CM | POA: Diagnosis not present

## 2016-11-15 DIAGNOSIS — M810 Age-related osteoporosis without current pathological fracture: Secondary | ICD-10-CM | POA: Diagnosis not present

## 2016-11-15 DIAGNOSIS — Z01419 Encounter for gynecological examination (general) (routine) without abnormal findings: Secondary | ICD-10-CM | POA: Diagnosis not present

## 2016-11-15 DIAGNOSIS — R896 Abnormal cytological findings in specimens from other organs, systems and tissues: Secondary | ICD-10-CM | POA: Diagnosis not present

## 2016-11-17 ENCOUNTER — Other Ambulatory Visit: Payer: Self-pay

## 2016-11-17 ENCOUNTER — Ambulatory Visit (HOSPITAL_COMMUNITY): Payer: Medicare HMO | Attending: Internal Medicine

## 2016-11-17 ENCOUNTER — Ambulatory Visit (INDEPENDENT_AMBULATORY_CARE_PROVIDER_SITE_OTHER): Payer: Medicare HMO

## 2016-11-17 DIAGNOSIS — I480 Paroxysmal atrial fibrillation: Secondary | ICD-10-CM

## 2016-11-17 DIAGNOSIS — R0602 Shortness of breath: Secondary | ICD-10-CM | POA: Insufficient documentation

## 2016-11-17 DIAGNOSIS — I361 Nonrheumatic tricuspid (valve) insufficiency: Secondary | ICD-10-CM | POA: Insufficient documentation

## 2016-11-17 DIAGNOSIS — I517 Cardiomegaly: Secondary | ICD-10-CM | POA: Diagnosis not present

## 2016-11-17 DIAGNOSIS — I340 Nonrheumatic mitral (valve) insufficiency: Secondary | ICD-10-CM | POA: Insufficient documentation

## 2016-11-17 DIAGNOSIS — I5189 Other ill-defined heart diseases: Secondary | ICD-10-CM | POA: Insufficient documentation

## 2016-11-22 ENCOUNTER — Telehealth: Payer: Self-pay

## 2016-11-22 DIAGNOSIS — R0602 Shortness of breath: Secondary | ICD-10-CM

## 2016-11-22 NOTE — Telephone Encounter (Signed)
Informed patient of results and verbal understanding expressed.  Patient will come in Wednesday for BNP. She was grateful for call and agrees with treatment plan.

## 2016-11-22 NOTE — Telephone Encounter (Signed)
-----   Message from Sueanne Margarita, MD sent at 11/17/2016 10:43 PM EDT ----- Echo showed normal LVF with increased stiffness of heart muscle, mild MR/TR

## 2016-11-24 ENCOUNTER — Other Ambulatory Visit: Payer: Medicare HMO | Admitting: *Deleted

## 2016-11-24 DIAGNOSIS — R0602 Shortness of breath: Secondary | ICD-10-CM

## 2016-11-25 LAB — PRO B NATRIURETIC PEPTIDE: NT-PRO BNP: 331 pg/mL (ref 0–738)

## 2016-12-23 ENCOUNTER — Telehealth: Payer: Self-pay

## 2016-12-23 NOTE — Telephone Encounter (Signed)
-----   Message from Sueanne Margarita, MD sent at 12/22/2016  2:02 PM EDT ----- Please let patient know that heart monitor showed NSR with frequent PACs and nonsustained atrial tachycardia up to 22 beats in a row.  No atrial fibrillation.  Increase Toprol to 50mg  q am and 25mg  qpm

## 2016-12-23 NOTE — Telephone Encounter (Signed)
Informed patient of results and verbal understanding expressed.  Patient is currently only taking Toprol 12.5 mg twice daily. Instructed her to INCREASE TOPROL to 25 mg twice daily. She understands she will be called if Dr. Radford Pax has further recommendations.

## 2016-12-30 DIAGNOSIS — G8929 Other chronic pain: Secondary | ICD-10-CM | POA: Diagnosis not present

## 2016-12-30 DIAGNOSIS — M25561 Pain in right knee: Secondary | ICD-10-CM | POA: Diagnosis not present

## 2017-01-05 DIAGNOSIS — H401131 Primary open-angle glaucoma, bilateral, mild stage: Secondary | ICD-10-CM | POA: Diagnosis not present

## 2017-01-06 DIAGNOSIS — G8929 Other chronic pain: Secondary | ICD-10-CM | POA: Diagnosis not present

## 2017-01-06 DIAGNOSIS — M25561 Pain in right knee: Secondary | ICD-10-CM | POA: Diagnosis not present

## 2017-01-11 DIAGNOSIS — M25561 Pain in right knee: Secondary | ICD-10-CM | POA: Diagnosis not present

## 2017-01-11 DIAGNOSIS — M23341 Other meniscus derangements, anterior horn of lateral meniscus, right knee: Secondary | ICD-10-CM | POA: Diagnosis not present

## 2017-01-11 DIAGNOSIS — G8929 Other chronic pain: Secondary | ICD-10-CM | POA: Diagnosis not present

## 2017-01-26 ENCOUNTER — Encounter: Payer: Self-pay | Admitting: Cardiology

## 2017-01-26 ENCOUNTER — Ambulatory Visit (INDEPENDENT_AMBULATORY_CARE_PROVIDER_SITE_OTHER): Payer: Medicare HMO | Admitting: Cardiology

## 2017-01-26 VITALS — BP 122/64 | HR 71 | Ht 65.0 in | Wt 125.0 lb

## 2017-01-26 DIAGNOSIS — I480 Paroxysmal atrial fibrillation: Secondary | ICD-10-CM | POA: Diagnosis not present

## 2017-01-26 MED ORDER — METOPROLOL SUCCINATE ER 25 MG PO TB24: ORAL_TABLET | ORAL | 3 refills | Status: DC

## 2017-01-26 MED ORDER — RIVAROXABAN 20 MG PO TABS: 20.0000 mg | ORAL_TABLET | Freq: Every day | ORAL | 3 refills | Status: DC

## 2017-01-26 NOTE — Progress Notes (Signed)
01/26/2017 Destiny Sanchez   [birth year]  [id number]  Primary Physician Noralee Space, MD Primary Cardiologist: Dr. Radford Pax    Reason for Visit/CC: F/u for Palpitations  HPI:  Destiny Sanchez is a 76 y.o. female followed by Dr. Radford Pax with a history of paroxysmal atrial fibrillation with a Chads Vasc score of 3. She is on rate control therapy with Metroprolol and is on Xarelto 20 mg daily for anticoagulation. She also has a h/o  has a history of hyperlipidemia and diastolic dysfunction. Most recent echocardiogram 11/17/2016 demonstrated normal left ventricular systolic function with an estimated ejection fraction of 37-34%, Grade 1 diastolic dysfunction and mild MR and TR.  The patient has also undergone stress testing. Stress echocardiogram was performed December 2014 which was a normal study.  She was last seen by Dr. Radford Pax on 10/21/2016. At that time, she complained of mild dyspnea and palpitations. BNP was within normal limits. Outpatient  cardiac monitor was ordered to assess for A. fib burden. This showed normal sinus rhythm with frequent PACs and nonsustained atrial tachycardia up to 22 beats in a row. No atrial fibrillation was noted. Dr. Radford Pax instructed her to increase her Toprol to 25 mg BID.  She presents to clinic today for follow-up. She has done fairly well, but continues to have palpitations. She avoids excess caffeine and ETOH. She admits that she does not always stay well hydrated and plans to increase fluid intake. She reports that she has cut back on her sodium intake and has not been bothered by any LEE. She has lasix at home that she uses PRN. EKG today shows NSR with PACs. HR 71 bpm. BP is well controlled at 122/64. She and her family are preparing for at trip to [place] and [place] this summer. She has f/u with Dr. Radford Pax when she returns in August.   Current Meds  Medication Sig  . ALPRAZolam (XANAX) 0.5 MG tablet Take 1/2 to 1 tablet by mouth three times daily  (Patient taking differently: Take 0.5 mg by mouth 3 (three) times daily as needed. Take 1/2 to 1 tablet by mouth three times daily)  . brinzolamide (AZOPT) 1 % ophthalmic suspension Place 1 drop into both eyes 3 (three) times daily.   . Calcium Carbonate-Vitamin D (CALTRATE 600+D PO) Take 1,200 mg by mouth daily.    . Cholecalciferol (VITAMIN D) 2000 UNITS tablet Take 2,000 Units by mouth daily.    . furosemide (LASIX) 20 MG tablet Take 1 tablet (20 mg total) by mouth daily as needed.  . latanoprost (XALATAN) 0.005 % ophthalmic solution Place 1 drop into both eyes at bedtime.  . metoprolol succinate (TOPROL-XL) 25 MG 24 hr tablet TAKE 50 MG IN THE AM AND 25 MG IN THE PM  . pantoprazole (PROTONIX) 40 MG tablet Take 1 tablet (40 mg total) by mouth as directed. DAILY FOR 2 WEEKS THEN AS NEEDED  . PROLIA 60 MG/ML SOLN injection as directed.  . rivaroxaban (XARELTO) 20 MG TABS tablet Take 1 tablet (20 mg total) by mouth daily with supper.  . [DISCONTINUED] metoprolol succinate (TOPROL-XL) 25 MG 24 hr tablet Take 1 tablet (25 mg total) by mouth 2 (two) times daily.  . [DISCONTINUED] rivaroxaban (XARELTO) 20 MG TABS tablet Take 1 tablet (20 mg total) by mouth daily with supper.   Current Facility-Administered Medications for the 01/26/17 encounter (Office Visit) with Consuelo Pandy, PA-C  Medication  . 0.9 %  sodium chloride infusion   No Known Allergies Past  Medical History:  Diagnosis Date  . Adenomatous colon polyp 06/2006  . Allergy   . Anemia   . Anxiety   . Blood transfusion 1967 &1971  . Cancer ([place])    basal cell on nose  . Cataract    bil cataracts removed  . Chronic kidney disease    left kidney had a diverticulum - surgery partial nephrectomy  . Clotting disorder (HCC)    hx of a fib on Xarelto  . GERD (gastroesophageal reflux disease)   . Glaucoma   . History of migraine   . Hx of cardiovascular stress test    a. ETT-Echo (07/2013):  Normal. //  b. ETT-Echo 6/17 -  Normal study after maximal exercise  . IBS (irritable bowel syndrome)   . Osteopenia   . Persistent atrial fibrillation (HCC)    Family History  Problem Relation Age of Onset  . Colon cancer Maternal Aunt   . Throat cancer Father        larynx cancer  . Esophageal cancer Neg Hx   . Pancreatic cancer Neg Hx   . Rectal cancer Neg Hx   . Stomach cancer Neg Hx    Past Surgical History:  Procedure Laterality Date  . ABDOMINAL HYSTERECTOMY  1975  . ABDOMINAL HYSTERECTOMY    . APPENDECTOMY    . CATARACT EXTRACTION W/ INTRAOCULAR LENS  IMPLANT, BILATERAL    . COLONOSCOPY    . HEMORROIDECTOMY  1973  . MOHS SURGERY    . PARTIAL NEPHRECTOMY  1971   diverticulum on left  . UPPER GASTROINTESTINAL ENDOSCOPY     Social History   Social History  . Marital status: Married    Spouse name: N/A  . Number of children: N/A  . Years of education: N/A   Occupational History  . Not on file.   Social History Main Topics  . Smoking status: Former Smoker    Packs/day: 1.00    Years: 10.00    Types: Cigarettes    Quit date: 08/30/1962  . Smokeless tobacco: Never Used  . Alcohol use 0.6 oz/week    1 Glasses of wine per week  . Drug use: No  . Sexual activity: Not on file   Other Topics Concern  . Not on file   Social History Narrative  . No narrative on file     Review of Systems: General: negative for chills, fever, night sweats or weight changes.  Cardiovascular: negative for chest pain, dyspnea on exertion, edema, orthopnea, palpitations, paroxysmal nocturnal dyspnea or shortness of breath Dermatological: negative for rash Respiratory: negative for cough or wheezing Urologic: negative for hematuria Abdominal: negative for nausea, vomiting, diarrhea, bright red blood per rectum, melena, or hematemesis Neurologic: negative for visual changes, syncope, or dizziness All other systems reviewed and are otherwise negative except as noted above.   Physical Exam:  Blood pressure  122/64, pulse 71, height 5\' 5"  (1.651 m), weight 125 lb (56.7 kg).  General appearance: alert, cooperative and no distress Neck: no carotid bruit and no JVD Lungs: clear to auscultation bilaterally Heart: regular rate and rhythm, S1, S2 normal, no murmur, click, rub or gallop Extremities: extremities normal, atraumatic, no cyanosis or edema Pulses: 2+ and symmetric Skin: Skin color, texture, turgor normal. No rashes or lesions Neurologic: Grossly normal  EKG NSR with PACs -- personally reviewed   ASSESSMENT AND PLAN:   1. PAF: NSR w/ PACs on EKG. Rate is controlled in the 70s. Recent outpatient monitor showed frequent PACs but no  afib. Continue rate control therapy with metoprolol. CHA2DS2 VASc score is 3. Continue Xarelto, 20 mg daily for stroke prophylaxis.   2. PACs/Atrial Tachycardia: demonstrated on recent outpatient cardiac monitor. Toprol was increased to 25 mg BID however she continues to have palpitations. EKG shows PACs.  HR 71 bpm. BP 122/64. We will try increasing Toprol to 50 mg in the am and 25 mg at night. Pt advised to stay well hydrated with fluids.   3. Diastolic Dysfunction: 2D echo 10/2016 showed normal LVED at 65-70% w/ Grade 1 DD. She takes lasix PRN for edema. She has cut back on sodium intake. She appears euvolemic on exam today. BP is controlled. Continue BB.    Follow-Up:  Keep scheduled f/u with Dr. Radford Pax in August.   Destiny Sanchez, [facility] 01/26/2017 9:06 AM

## 2017-01-26 NOTE — Patient Instructions (Signed)
Medication Instructions:   START TAKING METOPROLOL 50 MG IN THE AM AND 25 IN THE PM   If you need a refill on your cardiac medications before your next appointment, please call your pharmacy.  Labwork: NONE ORDERED  TODAY    Testing/Procedures: NONE ORDERED  TODAY    Follow-Up: KEEP AS SCHEDULED    Any Other Special Instructions Will Be Listed Below (If Applicable).

## 2017-02-22 DIAGNOSIS — M25561 Pain in right knee: Secondary | ICD-10-CM | POA: Diagnosis not present

## 2017-02-22 DIAGNOSIS — G8929 Other chronic pain: Secondary | ICD-10-CM | POA: Diagnosis not present

## 2017-02-22 DIAGNOSIS — M23341 Other meniscus derangements, anterior horn of lateral meniscus, right knee: Secondary | ICD-10-CM | POA: Diagnosis not present

## 2017-03-31 DIAGNOSIS — G8929 Other chronic pain: Secondary | ICD-10-CM | POA: Diagnosis not present

## 2017-03-31 DIAGNOSIS — M25561 Pain in right knee: Secondary | ICD-10-CM | POA: Diagnosis not present

## 2017-04-04 DIAGNOSIS — G8929 Other chronic pain: Secondary | ICD-10-CM | POA: Diagnosis not present

## 2017-04-04 DIAGNOSIS — M25561 Pain in right knee: Secondary | ICD-10-CM | POA: Diagnosis not present

## 2017-04-07 DIAGNOSIS — M23306 Other meniscus derangements, unspecified meniscus, right knee: Secondary | ICD-10-CM | POA: Diagnosis not present

## 2017-04-07 DIAGNOSIS — M23341 Other meniscus derangements, anterior horn of lateral meniscus, right knee: Secondary | ICD-10-CM | POA: Diagnosis not present

## 2017-04-07 DIAGNOSIS — G8929 Other chronic pain: Secondary | ICD-10-CM | POA: Diagnosis not present

## 2017-04-07 DIAGNOSIS — M25561 Pain in right knee: Secondary | ICD-10-CM | POA: Diagnosis not present

## 2017-04-11 DIAGNOSIS — M25561 Pain in right knee: Secondary | ICD-10-CM | POA: Diagnosis not present

## 2017-04-11 DIAGNOSIS — G8929 Other chronic pain: Secondary | ICD-10-CM | POA: Diagnosis not present

## 2017-04-13 DIAGNOSIS — G8929 Other chronic pain: Secondary | ICD-10-CM | POA: Diagnosis not present

## 2017-04-13 DIAGNOSIS — M25561 Pain in right knee: Secondary | ICD-10-CM | POA: Diagnosis not present

## 2017-04-18 DIAGNOSIS — M25561 Pain in right knee: Secondary | ICD-10-CM | POA: Diagnosis not present

## 2017-04-18 DIAGNOSIS — G8929 Other chronic pain: Secondary | ICD-10-CM | POA: Diagnosis not present

## 2017-04-20 DIAGNOSIS — G8929 Other chronic pain: Secondary | ICD-10-CM | POA: Diagnosis not present

## 2017-04-20 DIAGNOSIS — M25561 Pain in right knee: Secondary | ICD-10-CM | POA: Diagnosis not present

## 2017-04-21 DIAGNOSIS — L821 Other seborrheic keratosis: Secondary | ICD-10-CM | POA: Diagnosis not present

## 2017-04-21 DIAGNOSIS — Z85828 Personal history of other malignant neoplasm of skin: Secondary | ICD-10-CM | POA: Diagnosis not present

## 2017-04-21 DIAGNOSIS — D1801 Hemangioma of skin and subcutaneous tissue: Secondary | ICD-10-CM | POA: Diagnosis not present

## 2017-04-26 ENCOUNTER — Ambulatory Visit (INDEPENDENT_AMBULATORY_CARE_PROVIDER_SITE_OTHER): Payer: Medicare HMO | Admitting: Cardiology

## 2017-04-26 ENCOUNTER — Encounter: Payer: Self-pay | Admitting: Cardiology

## 2017-04-26 VITALS — BP 114/70 | HR 83 | Ht 65.0 in | Wt 123.8 lb

## 2017-04-26 DIAGNOSIS — R600 Localized edema: Secondary | ICD-10-CM | POA: Diagnosis not present

## 2017-04-26 DIAGNOSIS — I481 Persistent atrial fibrillation: Secondary | ICD-10-CM

## 2017-04-26 DIAGNOSIS — I4819 Other persistent atrial fibrillation: Secondary | ICD-10-CM

## 2017-04-26 LAB — BASIC METABOLIC PANEL WITH GFR
BUN/Creatinine Ratio: 31 — ABNORMAL HIGH (ref 12–28)
BUN: 16 mg/dL (ref 8–27)
CO2: 22 mmol/L (ref 20–29)
Calcium: 9.2 mg/dL (ref 8.7–10.3)
Chloride: 103 mmol/L (ref 96–106)
Creatinine, Ser: 0.51 mg/dL — ABNORMAL LOW (ref 0.57–1.00)
GFR calc Af Amer: 107 mL/min/1.73
GFR calc non Af Amer: 92 mL/min/1.73
Glucose: 88 mg/dL (ref 65–99)
Potassium: 4.3 mmol/L (ref 3.5–5.2)
Sodium: 140 mmol/L (ref 134–144)

## 2017-04-26 MED ORDER — NEBIVOLOL HCL 5 MG PO TABS: 5.0000 mg | ORAL_TABLET | Freq: Every day | ORAL | 11 refills | Status: DC

## 2017-04-26 NOTE — Progress Notes (Signed)
Cardiology Office Note:    Date:  04/26/2017   ID:  Destiny Sanchez, DOB June 23, 1941, MRN [id number]  PCP:  Noralee Space, MD  Cardiologist:  Fransico Him, MD   Referring MD: Noralee Space, MD   Chief Complaint  Patient presents with  . Atrial Fibrillation    History of Present Illness:    Destiny Sanchez is a 76 y.o. female with a hx of with a history of PAF and nonsustained atrial tachycardia with a Chads Vasc score of 3 on Metroprolol and  Xarelto 20 mg daily for anticoagulation. She also has a h/o  has a history of hyperlipidemia and diastolic dysfunction. Most recent echocardiogram 11/17/2016 demonstrated normal left ventricular systolic function with an estimated ejection fraction of 34-19%, Grade 1 diastolic dysfunction and mild MR and TR.   She is doing well today.  She denies any chest pain or pressure.  She has not had any further SOB, DOE.  Her LE edema is well controlled on diuretics.  She denies any dizziness. When she saw Lyda Jester, PA in the spring, her Toprol was increased to 50mg  qam and 25mg  qpm and her palpitations have improved but the increased dose is now causing severe fatigue and problems with mood swings and depression.  She thinks that the palpitations got better initially and only occasionally has palpitations.     Past Medical History:  Diagnosis Date  . Adenomatous colon polyp 06/2006  . Allergy   . Anemia   . Anxiety   . Blood transfusion 1967 &1971  . Cancer ([place])    basal cell on nose  . Cataract    bil cataracts removed  . Chronic kidney disease    left kidney had a diverticulum - surgery partial nephrectomy  . Clotting disorder (HCC)    hx of a fib on Xarelto  . GERD (gastroesophageal reflux disease)   . Glaucoma   . History of migraine   . Hx of cardiovascular stress test    a. ETT-Echo (07/2013):  Normal. //  b. ETT-Echo 6/17 - Normal study after maximal exercise  . IBS (irritable bowel syndrome)   . Osteopenia   .  Persistent atrial fibrillation [facility])     Past Surgical History:  Procedure Laterality Date  . ABDOMINAL HYSTERECTOMY  1975  . ABDOMINAL HYSTERECTOMY    . APPENDECTOMY    . CATARACT EXTRACTION W/ INTRAOCULAR LENS  IMPLANT, BILATERAL    . COLONOSCOPY    . HEMORROIDECTOMY  1973  . MOHS SURGERY    . PARTIAL NEPHRECTOMY  1971   diverticulum on left  . UPPER GASTROINTESTINAL ENDOSCOPY      Current Medications: Current Meds  Medication Sig  . ALPRAZolam (XANAX) 0.5 MG tablet Take 0.5 mg by mouth as needed for anxiety.  . brinzolamide (AZOPT) 1 % ophthalmic suspension Place 1 drop into both eyes 3 (three) times daily.   . Calcium Carbonate-Vitamin D (CALTRATE 600+D PO) Take 1,200 mg by mouth daily.    . Cholecalciferol (VITAMIN D) 2000 UNITS tablet Take 2,000 Units by mouth daily.    . furosemide (LASIX) 20 MG tablet Take 1 tablet (20 mg total) by mouth daily as needed.  . latanoprost (XALATAN) 0.005 % ophthalmic solution Place 1 drop into both eyes at bedtime.  . metoprolol succinate (TOPROL-XL) 25 MG 24 hr tablet TAKE 50 MG IN THE AM AND 25 MG IN THE PM  . pantoprazole (PROTONIX) 40 MG tablet Take 1 tablet (40 mg total) by  mouth as directed. DAILY FOR 2 WEEKS THEN AS NEEDED  . PROLIA 60 MG/ML SOLN injection as directed.  . rivaroxaban (XARELTO) 20 MG TABS tablet Take 1 tablet (20 mg total) by mouth daily with supper.   Current Facility-Administered Medications for the 04/26/17 encounter (Office Visit) with Sueanne Margarita, MD  Medication  . 0.9 %  sodium chloride infusion     Allergies:   Patient has no known allergies.   Social History   Social History  . Marital status: Married    Spouse name: N/A  . Number of children: N/A  . Years of education: N/A   Social History Main Topics  . Smoking status: Former Smoker    Packs/day: 1.00    Years: 10.00    Types: Cigarettes    Quit date: 08/30/1962  . Smokeless tobacco: Never Used  . Alcohol use 0.6 oz/week    1 Glasses of wine  per week  . Drug use: No  . Sexual activity: Not Asked   Other Topics Concern  . None   Social History Narrative  . None     Family History: The patient's family history includes Colon cancer in her maternal aunt; Throat cancer in her father. There is no history of Esophageal cancer, Pancreatic cancer, Rectal cancer, or Stomach cancer.  ROS:   Please see the history of present illness.     All other systems reviewed and are negative.  EKGs/Labs/Other Studies Reviewed:    The following studies were reviewed today: none  EKG:  EKG is ordered today and showed NSR with PACs  Recent Labs: 08/20/2016: ALT 15; BUN 19; Creatinine, Ser 0.59; Hemoglobin 13.9; Platelets 421.0; Potassium 4.1; Sodium 142; TSH 4.90 11/24/2016: NT-Pro BNP 331   Recent Lipid Panel    Component Value Date/Time   CHOL 176 08/20/2016 0749   TRIG 106.0 08/20/2016 0749   HDL 55.40 08/20/2016 0749   CHOLHDL 3 08/20/2016 0749   VLDL 21.2 08/20/2016 0749   LDLCALC 99 08/20/2016 0749   LDLDIRECT 127.0 03/11/2010 0910    Physical Exam:    VS:  BP 114/70   Pulse 83   Ht 5\' 5"  (1.651 m)   Wt 123 lb 12.8 oz (56.2 kg)   SpO2 98%   BMI 20.60 kg/m     Wt Readings from Last 3 Encounters:  04/26/17 123 lb 12.8 oz (56.2 kg)  01/26/17 125 lb (56.7 kg)  10/25/16 125 lb (56.7 kg)     GEN:  Well nourished, well developed in no acute distress HEENT: Normal NECK: No JVD; No carotid bruits LYMPHATICS: No lymphadenopathy CARDIAC: RRR, no murmurs, rubs, gallops RESPIRATORY:  Clear to auscultation without rales, wheezing or rhonchi  ABDOMEN: Soft, non-tender, non-distended MUSCULOSKELETAL:  No edema; No deformity  SKIN: Warm and dry NEUROLOGIC:  Alert and oriented x 3 PSYCHIATRIC:  Normal affect   ASSESSMENT:    1. Persistent atrial fibrillation ([place])   2. Edema extremities    PLAN:    In order of problems listed above:  1.  Persistent atrial fibrillation - she is maintaining NSR and her palpitations  are fairly well controlled on BB.  She will continue Xarelto.  Since she has not been tolerating higher doses of Toprol due to side effects, I will change her to Bystolic 5mg  daily which has less side effects.  I will check a BMET today to make sure her renal function is stable.    2.  LE edema - this is well controlled  on diuretics.  Check BMET today.    Medication Adjustments/Labs and Tests Ordered: Current medicines are reviewed at length with the patient today.  Concerns regarding medicines are outlined above.  No orders of the defined types were placed in this encounter.  No orders of the defined types were placed in this encounter.   Signed, Fransico Him, MD  04/26/2017 8:32 AM    [facility]

## 2017-04-26 NOTE — Patient Instructions (Signed)
Medication Instructions:  1) STOP TOPROL 2) START BYSTOLIC 5 mg daily  Labwork: TODAY: BMET  Testing/Procedures: None  Follow-Up: Your provider wants you to follow-up in: 6 months with Dr. Radford Pax. You will receive a reminder letter in the mail two months in advance. If you don't receive a letter, please call our office to schedule the follow-up appointment.    Any Other Special Instructions Will Be Listed Below (If Applicable).     If you need a refill on your cardiac medications before your next appointment, please call your pharmacy.

## 2017-04-26 NOTE — Addendum Note (Signed)
Addended by: Harland German A on: 04/26/2017 08:58 AM   Modules accepted: Orders

## 2017-04-27 DIAGNOSIS — M25561 Pain in right knee: Secondary | ICD-10-CM | POA: Diagnosis not present

## 2017-04-27 DIAGNOSIS — G8929 Other chronic pain: Secondary | ICD-10-CM | POA: Diagnosis not present

## 2017-04-29 ENCOUNTER — Telehealth: Payer: Self-pay | Admitting: Cardiology

## 2017-04-29 NOTE — Telephone Encounter (Signed)
Mrs. Destiny Sanchez is calling to let Dr. Radford Pax know that hse is feeling much bbetter . Dr. Radford Pax switched her beta blocker . Please call if you have any questions

## 2017-04-29 NOTE — Telephone Encounter (Signed)
Left message for patient to call if she has any concerns about her medications or if she starts to feel poorly again.

## 2017-05-04 DIAGNOSIS — M25561 Pain in right knee: Secondary | ICD-10-CM | POA: Diagnosis not present

## 2017-05-04 DIAGNOSIS — G8929 Other chronic pain: Secondary | ICD-10-CM | POA: Diagnosis not present

## 2017-05-05 DIAGNOSIS — M23341 Other meniscus derangements, anterior horn of lateral meniscus, right knee: Secondary | ICD-10-CM | POA: Diagnosis not present

## 2017-05-05 DIAGNOSIS — G8929 Other chronic pain: Secondary | ICD-10-CM | POA: Diagnosis not present

## 2017-05-05 DIAGNOSIS — M25561 Pain in right knee: Secondary | ICD-10-CM | POA: Diagnosis not present

## 2017-06-06 DIAGNOSIS — M810 Age-related osteoporosis without current pathological fracture: Secondary | ICD-10-CM | POA: Diagnosis not present

## 2017-06-13 DIAGNOSIS — M810 Age-related osteoporosis without current pathological fracture: Secondary | ICD-10-CM | POA: Diagnosis not present

## 2017-06-22 DIAGNOSIS — R69 Illness, unspecified: Secondary | ICD-10-CM | POA: Diagnosis not present

## 2017-07-06 DIAGNOSIS — H5201 Hypermetropia, right eye: Secondary | ICD-10-CM | POA: Diagnosis not present

## 2017-07-06 DIAGNOSIS — H401421 Capsular glaucoma with pseudoexfoliation of lens, left eye, mild stage: Secondary | ICD-10-CM | POA: Diagnosis not present

## 2017-07-06 DIAGNOSIS — H401111 Primary open-angle glaucoma, right eye, mild stage: Secondary | ICD-10-CM | POA: Diagnosis not present

## 2017-07-06 DIAGNOSIS — H5212 Myopia, left eye: Secondary | ICD-10-CM | POA: Diagnosis not present

## 2017-07-11 ENCOUNTER — Ambulatory Visit (INDEPENDENT_AMBULATORY_CARE_PROVIDER_SITE_OTHER): Payer: Medicare HMO

## 2017-07-11 ENCOUNTER — Ambulatory Visit: Payer: Medicare HMO | Admitting: Pulmonary Disease

## 2017-07-11 ENCOUNTER — Encounter: Payer: Self-pay | Admitting: Pulmonary Disease

## 2017-07-11 ENCOUNTER — Telehealth: Payer: Self-pay | Admitting: Cardiology

## 2017-07-11 ENCOUNTER — Other Ambulatory Visit (INDEPENDENT_AMBULATORY_CARE_PROVIDER_SITE_OTHER): Payer: Medicare HMO

## 2017-07-11 VITALS — BP 108/56 | HR 61 | Ht 65.0 in | Wt 123.0 lb

## 2017-07-11 DIAGNOSIS — R002 Palpitations: Secondary | ICD-10-CM

## 2017-07-11 DIAGNOSIS — K5730 Diverticulosis of large intestine without perforation or abscess without bleeding: Secondary | ICD-10-CM

## 2017-07-11 DIAGNOSIS — J301 Allergic rhinitis due to pollen: Secondary | ICD-10-CM

## 2017-07-11 DIAGNOSIS — Z23 Encounter for immunization: Secondary | ICD-10-CM | POA: Diagnosis not present

## 2017-07-11 DIAGNOSIS — I4819 Other persistent atrial fibrillation: Secondary | ICD-10-CM

## 2017-07-11 DIAGNOSIS — I481 Persistent atrial fibrillation: Secondary | ICD-10-CM

## 2017-07-11 DIAGNOSIS — K219 Gastro-esophageal reflux disease without esophagitis: Secondary | ICD-10-CM | POA: Diagnosis not present

## 2017-07-11 DIAGNOSIS — E559 Vitamin D deficiency, unspecified: Secondary | ICD-10-CM

## 2017-07-11 DIAGNOSIS — F411 Generalized anxiety disorder: Secondary | ICD-10-CM

## 2017-07-11 DIAGNOSIS — K589 Irritable bowel syndrome without diarrhea: Secondary | ICD-10-CM

## 2017-07-11 DIAGNOSIS — Z86010 Personal history of colonic polyps: Secondary | ICD-10-CM

## 2017-07-11 LAB — CBC WITH DIFFERENTIAL/PLATELET
BASOS ABS: 0.1 10*3/uL (ref 0.0–0.1)
Basophils Relative: 0.9 % (ref 0.0–3.0)
EOS ABS: 0.1 10*3/uL (ref 0.0–0.7)
Eosinophils Relative: 2.4 % (ref 0.0–5.0)
HEMATOCRIT: 44.2 % (ref 36.0–46.0)
HEMOGLOBIN: 14.7 g/dL (ref 12.0–15.0)
Lymphocytes Relative: 19.6 % (ref 12.0–46.0)
Lymphs Abs: 1.1 10*3/uL (ref 0.7–4.0)
MCHC: 33.3 g/dL (ref 30.0–36.0)
MCV: 99.3 fl (ref 78.0–100.0)
MONOS PCT: 7.1 % (ref 3.0–12.0)
Monocytes Absolute: 0.4 10*3/uL (ref 0.1–1.0)
NEUTROS ABS: 4 10*3/uL (ref 1.4–7.7)
Neutrophils Relative %: 70 % (ref 43.0–77.0)
PLATELETS: 436 10*3/uL — AB (ref 150.0–400.0)
RBC: 4.45 Mil/uL (ref 3.87–5.11)
RDW: 13.7 % (ref 11.5–15.5)
WBC: 5.7 10*3/uL (ref 4.0–10.5)

## 2017-07-11 LAB — COMPREHENSIVE METABOLIC PANEL
ALT: 17 U/L (ref 0–35)
AST: 16 U/L (ref 0–37)
Albumin: 4.4 g/dL (ref 3.5–5.2)
Alkaline Phosphatase: 36 U/L — ABNORMAL LOW (ref 39–117)
BUN: 15 mg/dL (ref 6–23)
CALCIUM: 9.5 mg/dL (ref 8.4–10.5)
CHLORIDE: 105 meq/L (ref 96–112)
CO2: 28 meq/L (ref 19–32)
Creatinine, Ser: 0.57 mg/dL (ref 0.40–1.20)
GFR: 108.85 mL/min (ref >60.00)
Glucose, Bld: 94 mg/dL (ref 70–99)
POTASSIUM: 4.1 meq/L (ref 3.5–5.1)
Sodium: 141 mEq/L (ref 135–145)
Total Bilirubin: 0.9 mg/dL (ref 0.2–1.2)
Total Protein: 6.7 g/dL (ref 6.0–8.3)

## 2017-07-11 LAB — LIPID PANEL
CHOL/HDL RATIO: 3
Cholesterol: 183 mg/dL (ref 0–200)
HDL: 54.8 mg/dL (ref >39.00)
LDL CALC: 109 mg/dL — AB (ref 0–99)
NonHDL: 128.51
TRIGLYCERIDES: 97 mg/dL (ref 0.0–149.0)
VLDL: 19.4 mg/dL (ref 0.0–40.0)

## 2017-07-11 LAB — TSH: TSH: 3.62 u[IU]/mL (ref 0.35–4.50)

## 2017-07-11 MED ORDER — DILTIAZEM HCL ER COATED BEADS 120 MG PO CP24: 120.0000 mg | ORAL_CAPSULE | Freq: Every day | ORAL | 2 refills | Status: DC

## 2017-07-11 NOTE — Patient Instructions (Signed)
Today we updated your med list in our EPIC system...     We decided to change your Bystolic which may be causing some side effects, to CARDIZEM-CD (Diltiazem) 120mg  take one tab daily...  This will need to be followed up & adjusted by your Cardiologist- DrTTurner in several weeks...  We gave you the 2018 FLU vaccine today...  We also did your follow up FASTING blood work...    We will contact you w/ the results when available...   Call for any questions or if we can be of service in any way.Marland KitchenMarland Kitchen

## 2017-07-11 NOTE — Progress Notes (Signed)
Subjective:     Patient ID: Destiny Sanchez, female   DOB: 06-21-1941, 76 y.o.   MRN: [id number]  HPI 76 y/o WF here for a follow up visit... SEE PREV EPIC NOTES FOR OLDER DATA>>    2DEcho 04/2010 showed EF=60-65%, no RWMA, Gr1DD, PAsys=31mHg  LABS 6/13:  FLP- ok on diet x LDL=108;  Chems- wnl;  CBC- wnl;  TSH=3.53...  ~  July 18, 2013:  142moO[place]aytons still c/o Sanchez- skips, irregularity, not racing/ lightheaded, dizzy, etc;  She notes that these often occur w/ activity (eg. walking, playing golf, when hot) & may be assoc w/ SOB;  She last saw DrMcLean in 2011 for similar symptoms- at that time she had a 2DEcho (norm LVF & valves), and a Holter (PACs & short run atrial tachy- no worrisome arrhythmias);  They rec elim of caffeine & low dose BBlocker- Metoprolol25- 1/2Bid... She is still quite concerned about these Sanchez and we discussed follow up w/ Cards- DrMcLean w/ poss repeat holter/ event recorder & poss treadmill test...     We reviewed prob list, meds, xrays and labs> see below for updates >> she had the 2014 Flu vaccine 10/14...   EKG 11/14 showed NSR, rate70, sm Qs inferiorly, otherw NAD...   CXR 8/14 showed norm heart size, clear lungs, DJD in spine & osteopenia of bones...  LABS 8/14:  Chems- wnl;  CBC- wnl...   ~  August 16, 2104:  13[place] [facility] yellow sput, nasal drainage, & HA; she felt feverish w/ chills and her [facility] ZPak for her; the congestion has persisted & she is not resting;  She also reports a good bit of stress- in [id number] [place] hus[place] & she had to do CPR, call 911, etc; he spent 2wks in hospw/ pacer/defib placed & he is improved- in rehab, playing golf again etc...     Glaucoma> on drops per ophthalmology & followed regularly...    AR, Bronchitis> Hx LPR w/ prev eval by DrByers, no longer taking PPI; uses OTC antihist as  needed; presented 12/15 w/ URI/ AB=> treated w/ Zithromax, Depo80, Pred taper, Mucinex, Fluids...    AtypCP, Palpit> followed by DrMcLean w/ PAF found on Holter done for palpit; [place] stress [place]; [facility] reviewed    Borderline Chol> on diet alone; FLP 12/15 showed TChol 173, TG 93, HDL 44, LDL 111; rec to continue diet & exercise...    GI- GERD, Divertics, IBS, colon polyps> followed by DrStark- prev on Aciphex; last colon was 11/12 w/ divertics and sm adenoma removed..    Migraine HAs> she had prev eval by DrAdelman, & accupuncture treatment by DrBloom; last treated w/ zoloft from DrFreeman...    Anxiety> under some stress but she's declined anxiolytic rx...     DERM- she has lipoma on her back assessed by [facility]CS & had Ca on  Nose removed by Derm... We reviewed prob list, meds, xrays and labs> see below for updates >>   CXR 12/15 showed norm heart size, clear lungs/ NAD, some hyperinflation suggested by radiology...  LABS 12/15:  FLP- ok w/ LDL=111;  Chems- wnl;  CBC- wnl;  TSH= 3.32...  PLAN>>  she has Bronchitis & AB exac- we will Rx w/ the Zithromax, Deop80, Pred-3d taper, Mucinex, fluids, etc, continue other meds the same...  ~  August 06, 2015:  Yearly [place] reports a good year- no new complaints or concerns; her husb had sudden cardiac death at home & she did CPR, he was in a coma for 6-7d and is now doing remarkably well w/ an implanted defib now;  She has experienced some anxiety & we discussed Rx w/ alpraz 0.'5mg'$  1/2 to 1 tab Tid prn;  She also notes some left shoulder pain & we will set up an Ortho consult w/ DrWainer Harriett Sine...  We reviewed the following medical problems during today's office visit >> NOTE: one son has celiac dis and one son has DM.    Glaucoma> on drops per ophthalmology & followed regularly...    AR, Bronchitis> Hx LPR w/ prev eval by DrByers, no longer taking PPI; uses OTC antihist as needed; presented  12/15 w/ URI/ AB=> treated w/ Zithromax, Depo80, Pred taper, Mucinex, Fluids...    AtypCP, Palpit> followed by DrMcLean w/ PAF found on Holter done for palpit; on [place]; she had neg stress echo 2014; [facility]- note reviewed    Borderline Chol> on diet alone; FLP 12/16 showed TChol 176, TG 86, HDL 52, LDL 107; rec to continue diet & exercise...    GI- GERD, Divertics, IBS, colon polyps> followed by DrStark- prev on Aciphex; last colon was 11/12 w/ divertics and sm adenoma removed..    Migraine HAs> she had prev eval by DrAdelman, & accupuncture treatment by DrBloom; last treated w/ zoloft from DrFreeman...    Anxiety> under some stress but she's declined anxiolytic rx...     DERM- she has lipoma on her back assessed by CCS & had Ca on nose removed by Derm... EXAM shows Afeb, VSS, O2sat=99% on RA;  HEENT- neg, mallampati2;  Chest- clear w/o w/r/r;  Heart- RR s4 w/o m/r;  Abd- soft, nontender, neg;  Ext- L shoulder pain, w/o c/c/e;  Neuro- intact...  LABS 08/05/16>  FLP- ok on diet alone w/ LDL=107;  Chems- wnl;  CBC- wnl;  TSH=4.69;  VitD=37 IMP/PLAN>>  Destiny Sanchez is doing satis- she has some anxiety & we dicussed Rx w/ Alpraz 0.'5mg'$  prn (he had a traumatic event w/ husb's SCD but she saved his life w/ CPR & her quick action; we gave her the 2016 FLU vaccine today; we will arrange for Ortho consult for left shoulder pain...   ~  August 19, 2016:  Yearly Destiny Sanchez is stable- notes some fatigue to go along w/ her 13 yrs, and says she noted some ankle edema last summer while on vacation in [place];  Reminded to start a regular exercise program like Silver Sneakers and to avoid salt/ sodium esp while on vacation, could also use support hose & elevation... She had several follow up visits this past yr>>    She saw CARDS-SWeaver,PA on 01/05/16>  DrMcLean is her cardiologist, hx palpit, PAF, HL; norm stress echo 11/14 and event recorder revealed several short runs of PAF;     She  saw GI-DrStark 08/17/16 (new pt 35mn appt)>  Hx GERD & adenomatous colon polyps but on Protonix & Xarelto for PAF; last colon 11/12 & they plan 5 yr f/u colon soon... We reviewed the following medical problems during today's office visit >>     Glaucoma> on drops per ophthalmology- DrMcCuen & followed regularly...    AR, Bronchitis> Hx LPR w/ prev eval by DrByers, on PPI- Protonix40 as needed; uses OTC antihist as needed; presented 12/15 w/ URI/ AB=> treated w/  Zithromax, Depo80, Pred taper, Mucinex, Fluids...    AtypCP, Palpit> followed by DrMcLean w/ PAF found on Holter done for palpit; on [place]; she had neg stress echo 2014; last seen by Cards 5/17 as above...    Borderline Chol> on diet alone; FLP 12/17 showed TChol 176, TG 106, HDL 55, LDL 99; rec to continue diet & exercise...    GI- GERD, Divertics, IBS, colon polyps> followed by DrStark- on PPI prn; last colon was 11/12 w/ divertics and sm adenoma removed=> they plan f/u colon soon    Migraine HAs> she had prev eval by DrAdelman, & accupuncture treatment by DrBloom; last treated w/ Zoloft from [place]...    Anxiety> under some stress w/ husb's cardiac issues etc; she has Xanax 0.'5mg'$  for prn use & takes it at bedtime for rest...    DERM- she has lipoma on her back assessed by [facility] & had Ca on nose removed by Derm... EXAM shows Afeb, VSS, O2sat=100% on RA;  HEENT- neg, mallampati2;  Chest- clear w/o w/r/r;  Heart- RR s4 w/o m/r;  Abd- soft, nontender, neg;  Ext- L shoulder pain, w/o c/c/e;  Neuro- intact...  EKG 01/05/16>  NSR, rate88, few PACs, otherw norm EKG...  CXR 08/19/16>  Norm heart size, sl hyperinflation- clear lungs, NAD...  LABS 08/20/16>  FLP- at goals on diet alone;  Chems- wnl;  CBC- wnl;  TSH=4.90 & stable, not on meds;  VitD=36 on 2000u daily supplement... IMP/PLAN>>  Destiny Sanchez is stable overall- continue same meds, no salt, & incr exercise program; we gave her the 2017 FLU vaccine today...   ~  July 11, 2017:  Yearly ROV & general medical follow up visit>  Destiny Sanchez says "not great- it's the Bystolic" c/o feeling tired, crankey, stomach ache, just not feeling well & I have to go back to bed;  She was prev on Metoprolol & DrTurner checked event monitor w/ "atrial tachy" per pt; intially Metoprolol was increased, and later switched to Bystolic- she decreased it to 1/2 tab 7 felt better but now wants to switch to [place] she has ROV w/ DrTurner soon... We reviewed the following interval epic notes available to us>      She saw GI- DrStark, w/ Colonoscopy 10/25/16>  24yrf/u procedure for hx adenomatous polyps showed few divertics, 3 polyps ~640msize, Bx revealed tubular adenomas, no dysplasia or malig, DrStark said no f/u due to age.     She saw CARDS- BSimmons,PA on 01/26/17>  Hx PAF w/ Chads-Vasc score of 3 and anticoag w/ Xarelto & rate control w/ Metoprolol;  Last Holter Monitor 09/2016 to assess AFib burden showed NSR, freq PACs, & nonsustained atrial tachy up to 22 beats in a row but no AFib; Metoprolol dose was increased slightly to accomodate; This day c/o incr palit despite no caffeine etc; they decided to incr Metoprolol to '50mg'$ Qam & '25mg'$  Qpm...    She saw CARDS-DrTTurner on 04/26/17>  Hx PAF & nonsustained atrial tachy, on Metoprolol & Xarelto; also has hx HL, diastolic dysfunction;  Last Echo 10/2016 showed norm LVF w/ EF=65-70%, Gr1DD, mild MR & TR;  Currently denies symptoms x fatigue from the BBlocker;  EKG showed NSR, occas PAC;  Her Metoprolol was changed to Bystolic5 at this visit...  We reviewed the following medical problems during today's office visit>      Glaucoma> on drops per ophthalmology- DrMcCuen & followed regularly...    AR, Bronchitis> Hx LPR w/ prev eval by DrByers, on  PPI- Protonix40 as needed; uses OTC antihist as needed; presented 12/15 w/ URI/ AB=> treated w/ Zithromax, Depo80, Pred taper, Mucinex, Fluids...    AtypCP, Palpit> followed by DrMcLean=> TTurner w/ PAF found  on Holter done for palpit; on MetopXL=> ch to BYSTOLIC5,+ LOVFIEP32; she had neg stress echo 2014; see above- Metoprolol changed to Bystolic5 due to fatigue.    Borderline Chol> on diet alone; FLP 11/18 showed TChol 183, TG 97, HDL 55, LDL 109; rec to continue diet & exercise...    GI- GERD, Divertics, IBS, colon polyps> followed by DrStark- on PPI prn; had Colonoscopy f/u 09/2016 w/ divertics and 3 polyps removed=> tub adenomas, no malignancy & DrStark said no f/u due to age...     Migraine HAs> she had prev eval by DrAdelman, & accupuncture treatment by DrBloom; last treated w/ Zoloft from [place]...    Knee pain and Osteoporosis> she had knee pain assessed by DrAlusio, Dx w/ meniscus tear 7 rec for Arthroscopy, she is holding off, had Pt etc; GYN is DrMody- Dx w/ osteoporosis thru her office & rec for Prolia, she gets these shots in [place] at [place] (son is an endocrinologist there)...    Anxiety> under some stress w/ husb's cardiac issues etc; she has Xanax 0.'5mg'$  for prn use & takes it at bedtime for rest...    DERM- she has lipoma on her back assessed by [facility] & had Ca on nose removed by Derm... EXAM shows Afeb, VSS, O2sat=98% on RA;  HEENT- neg, mallampati2;  Chest- clear w/o w/r/r;  Heart- RR s4 w/o m/r;  Abd- soft, nontender, neg;  Ext- L shoulder pain, w/o c/c/e;  Neuro- intact...  LABS 07/11/17>  FLP- at goals on diet alone x LDL=109;  Chems- wnl w/ BS=94, Cr=0.57, LFTs wnl;  CBC- wnl w/ Hg=14.7, WBC=5.7K;  TSH=3.62;  VitD=50 (18-72)... IMP/PLAN>>  Destiny Sanchez has some persistent palpit & side effects from BBlockrs & wants to switch to CardizemCD120, she has a follow up appt w/ Cards soon;  She remains under the care of DrMcCuen for Opththal- Glaucoma monitoring on gtts;  She had her f/u colonoscopy w/ 3 polyps removed, tub adenomas & DrStark told her she would not need f/u colon due to age;  She saw DrAlusio for knee pain & has a torn meniscus, holding off on arthroscopy for now;  She also has  osteoporosis per GYN DrMody & is getting Prolia !58moat [place] [facility] [place].. [place] 2018 FLU vaccine today, she knows to call for any problems...          Problem List:    GLAUCOMA (ICD-365.9) [facility]...  ALLERGIC RHINITIS (ICD-477.9) - has seen DrByers for ENT w/ LER... Uses OTC antihist as needed...  Sanchez (ICD-785.1) >> on METOPROLOL '25mg'$ - 1/2 bid per DrMcLean... ~  normal 2DEcho 1993...  ~  negative Stress Cardiolite 2003 without ischemia and EF=75%... ~  Eval by Cards 2011- DrMcLean>  2DEcho & holter below- started on Metoprolol '25mg'$ - 1/2 Bid & asked to avoid caffeine etc... ~  2DEcho 9/11 showed normal cavity size, normal sys function w/ EF= 60-65%, no regional wall motion problems, Gr1DD ~  Holter monitor 2011 showed PACs & short run (5beats) of atrial tachy... ~  2014: repeat Cards eval w/ neg stress echo but Holter showed several short runs of AFib; DrMcLean Rx w/ MetopER25Bid & XEugenio Hoes.. ~  2015: stable on Metoprolol ER 25Bid & XEugenio Hoes she sees DrMcLean Q638mo stable...Marland KitchenMarland Kitchen  CHEST PAIN, ATYPICAL (ICD-786.59) ~  CXR 7/11 showed normal heart size, clear lungs, mild osteopenia... ~  EKG 9/11 showed NSR, rate76, poss LAE, no STTWA, borderline tracing... ~  EKG 11/14 showed NSR, rate70, sm Qs inferiorly, otherw NAD... ~  Stress Echo 2014 was neg...   CHOLESTEROL >> on diet alone... ~  FLP 7/11 showed TChol 201, TG 62, HDL 60, LDL 127 ~  FLP 6/13 showed TChol 189, TG 62, HDL 69, LDL 108 ~  FLP 12/15 showed TChol 173, TG 93, HDL 44, LDL 111   GERD (ICD-530.81) - GI eval DrStark w/ GERD, LER, & Globus phenom... controlled w/ ACIPHEX which she prefers to the other PPIs... (Nexium & Omep without benefit)...  DIVERTICULOSIS >> IRRITABLE BOWEL SYNDROME >> COLON POLYPS >> colonoscopy 11/07 by DrStark showed 42m polyp (tubular adenoma) f/u planned 5 yrs. ~  She had f/u colonoscopy 11/12 by DrStark w/ divertics & 2 sm polypd  removed from transverse colon; path= tubular adenoma & f/u planned 5 yrs.  Hx of MIGRAINE HEADACHE (ICD-346.90) - Hx myofascial pain as well w/ prev accupuncture therapy in 2003 by DrBloom... [facility]... ~  Now followed by DrFreeman she reports Rx for Prn Imitrex & ZOLOFT '50mg'$ /d...  ANXIETY (ICD-300.00) ~  She had a good bit of stress 7/15 w/ her husb cardiac arrest, she did CPR & called 911, he spent 2wks in hosp but survived now w/ defib/pacer & in cardiac rehab...  Health Maintenance -  ~  GI:  Followed by DrStark  ~  GYN: DrDickstein in the past for PAP, Mammogram, BMD... ~  Immunizations:  She is encouraged to get the yearly Flu vaccine;  She had the Pneumovax several yrs ago;  She had TDAP 1/13;  We discussed the Shingles vaccine...   Past Medical History:  Diagnosis Date  . Adenomatous colon polyp 06/2006  . Allergy   . Anemia   . Anxiety   . Blood transfusion 1967 &1971  . [place]    basal cell on nose  . Cataract    bil cataracts removed  . Chronic kidney disease    left kidney had a diverticulum - surgery partial nephrectomy  . Clotting disorder (HCC)    hx of a fib on Xarelto  . GERD (gastroesophageal reflux disease)   . Glaucoma   . History of migraine   . Hx of cardiovascular stress test    a. ETT-Echo (07/2013):  Normal. //  b. ETT-Echo 6/17 - Normal study after maximal exercise  . IBS (irritable bowel syndrome)   . Osteopenia   . Persistent atrial fibrillation ([employer]     Past Surgical History:  Procedure Laterality Date  . ABDOMINAL HYSTERECTOMY  1975  . ABDOMINAL HYSTERECTOMY    . APPENDECTOMY    . CATARACT EXTRACTION W/ INTRAOCULAR LENS  IMPLANT, BILATERAL    . COLONOSCOPY    . HEMORROIDECTOMY  1973  . MOHS SURGERY    . PARTIAL NEPHRECTOMY  1971   diverticulum on left  . UPPER GASTROINTESTINAL ENDOSCOPY      Outpatient Encounter Medications as of 07/11/2017  Medication Sig  . ALPRAZolam (XANAX) 0.5 MG tablet  Take 0.5 mg by mouth as needed for anxiety.  . brinzolamide (AZOPT) 1 % ophthalmic suspension Place 1 drop into both eyes 3 (three) times daily.   . Calcium Carbonate-Vitamin D (CALTRATE 600+D PO) Take 1,200 mg by mouth daily.    . Cholecalciferol (VITAMIN D) 2000 UNITS tablet Take  2,000 Units by mouth daily.    Marland Kitchen latanoprost (XALATAN) 0.005 % ophthalmic solution Place 1 drop into both eyes at bedtime.  Marland Kitchen PROLIA 60 MG/ML SOLN injection as directed.  . rivaroxaban (XARELTO) 20 MG TABS tablet Take 1 tablet (20 mg total) by mouth daily with supper.  . [DISCONTINUED] nebivolol (BYSTOLIC) 5 MG tablet Take 1 tablet (5 mg total) by mouth daily.  Marland Kitchen diltiazem (CARDIZEM CD) 120 MG 24 hr capsule Take 1 capsule (120 mg total) daily by mouth.  . furosemide (LASIX) 20 MG tablet Take 1 tablet (20 mg total) by mouth daily as needed. (Patient not taking: Reported on 07/11/2017)  . [DISCONTINUED] pantoprazole (PROTONIX) 40 MG tablet Take 1 tablet (40 mg total) by mouth as directed. DAILY FOR 2 WEEKS THEN AS NEEDED (Patient not taking: Reported on 07/11/2017)   Facility-Administered Encounter Medications as of 07/11/2017  Medication  . 0.9 %  sodium chloride infusion    No Known Allergies   Immunization History  Administered Date(s) Administered  . Influenza Split 06/19/2011, 06/25/2013  . Influenza Whole 05/05/2009, 04/30/2012  . Influenza, High Dose Seasonal PF 07/11/2017  . Influenza,inj,Quad PF,6+ Mos 09/13/2014, 08/06/2015  . Pneumococcal Conjugate-13 10/15/2014  . Pneumococcal-Unspecified 07/30/2004  . Tdap 08/31/2011    Current Medications, Allergies, Past Medical History, Past Surgical History, Family History, and Social History were reviewed in [employer] record.   Review of Systems         The patient complains of occas Sanchez.  The patient denies fever, chills, sweats, anorexia, weakness, weight loss, sleep disorder, blurring, diplopia, eye irritation, eye  discharge, vision loss, eye pain, photophobia, earache, ear discharge, tinnitus, decreased hearing, nasal congestion, nosebleeds, sore throat, hoarseness, chest pain, syncope, dyspnea on exertion, orthopnea, PND, peripheral edema, cough, dyspnea at rest, excessive sputum, hemoptysis, wheezing, pleurisy, nausea, vomiting, constipation, melena, hematochezia, jaundice, gas/bloating, indigestion/heartburn, dysphagia, odynophagia, dysuria, hematuria, urinary frequency, urinary hesitancy, nocturia, incontinence, back pain, joint pain, joint swelling, muscle weakness, stiffness, arthritis, sciatica, restless legs, leg pain at night, leg pain with exertion, rash, itching, dryness, suspicious lesions, paralysis, paresthesias, seizures, tremors, vertigo, transient blindness, frequent falls, difficulty walking, depression, anxiety, memory loss, confusion, cold intolerance, heat intolerance, polydipsia, polyphagia, polyuria, unusual weight change, abnormal bruising, bleeding, enlarged lymph nodes, urticaria, allergic rash, hay fever, and recurrent infections.  Note- all other systems neg except as noted...   Objective:   Physical Exam    WD, WN, 76 y/o WF in NAD... GENERAL:  Alert & oriented; pleasant & cooperative. HEENT:  [place]/AT, EOM-wnl, PERRLA, Fundi-benign, EACs-clear, TMs-wnl, NOSE-clear, THROAT-clear & wnl. NECK:  Supple w/ full ROM; no JVD; normal carotid impulses w/o bruits; no thyromegaly or nodules palpated; no lymphadenopathy. CHEST:  Clear w/o wheezing/ rales/ rhonchi... HEART:  Regular Rhythm; without murmurs/ rubs/ or gallops. ABDOMEN:  Soft & nontender; normal bowel sounds; no organomegaly or masses detected. EXT: without deformities or arthritic changes; no varicose veins/ venous insuffic/ or edema, pulses intact... NEURO:  CN's intact; motor testing normal; sensory testing normal; gait normal & balance OK. DERM:  No lesions noted; no rash etc...  RADIOLOGY DATA:  Reviewed in the EPIC EMR &  discussed w/ the patient...  LABORATORY DATA:  Reviewed in the EPIC EMR & discussed w/ the patient...   Assessment:      08/19/16>   Karina is stable overall- continue same meds, no salt, & incr exercise program; we gave her the 2017 FLU vaccine today... 07/11/17>   Destiny Sanchez has some persistent palpit &  side effects from BBlockrs & wants to switch to CardizemCD120, she has a follow up appt w/ Cards soon;  She remains under the care of DrMcCuen for Opththal- Glaucoma monitoring on gtts;  She had her f/u colonoscopy w/ 3 polyps removed, tub adenomas & DrStark told her she would not need f/u colon due to age;  She saw DrAlusio for knee pain & has a torn meniscus, holding off on arthroscopy for now;  She also has osteoporosis per GYN DrMody & is getting Prolia !57moat [place] [place] FLU vaccine today, she knows to call for any problems.   Hx Acute bronchitis w/ airway inflamm>  Prev treated w/ Zithromax, Pred, Mucinex Fluids...  Glaucoma>  [facility]  AR>  She uses OTC Antihist etc as needed...  Hx AtypCP, Palpit>  Followed by DrMcLean & on Metoprolol '25mg'$ - 1/2 Bid w/ continued symptoms; we discussed incr to 1 tab Bid & f/u Cards...  Borderline Chol>  LDL was 111, on diet + exercise...  GERD>  Reminded to take PPI vs H2Blocker as needed for symptoms...  Divertics, IBS, Polyps>  Followed by DrStark & up to date on colonoscopy...  Hx Migraine HAs>  She has Imitrex for prn use from DrFreeman...  Hx Anxiety> she takes Zoloft from [employer]..     Plan:       Medication List        Accurate as of 07/11/17  5:44 PM. Always use your most recent med list.          ALPRAZolam 0.5 MG tablet Commonly known as:  XANAX   brinzolamide 1 % ophthalmic suspension Commonly known as:  AZOPT   CALTRATE 600+D PO   diltiazem 120 MG 24 hr capsule Commonly known as:  CARDIZEM CD Take 1 capsule (120 mg total) daily by mouth.     furosemide 20 MG tablet Commonly known as:  LASIX Take 1 tablet (20 mg total) by mouth daily as needed.   latanoprost 0.005 % ophthalmic solution Commonly known as:  XALATAN   PROLIA 60 MG/ML Soln injection Generic drug:  denosumab   rivaroxaban 20 MG Tabs tablet Commonly known as:  XARELTO Take 1 tablet (20 mg total) by mouth daily with supper.   Vitamin D 2000 units tablet       Where to Get Your Medications    [address] [facility] [address]  [address] [place] [place]  Phone:  [phone]  diltiazem 120 MG 24 hr capsule

## 2017-07-11 NOTE — Telephone Encounter (Signed)
New message  Pt verbalized that she is calling for the rn  Dr.Scott Lenna Gilford wants pt to speak to RN or Dr.Turner about new cardizem-cd 120mg  medication

## 2017-07-11 NOTE — Telephone Encounter (Signed)
Patient calling and states that the patient had been having HAs, lightheadedness, nausea, and fatigue. She states that she saw Dr. Lenna Gilford today who thought it was related to her Bystolic. She states that he stopped her Bystolic, started her on Cardizem CD 120 mg QD, and recommended that she follow up in our office in 2 weeks. Appointment made for patient to see Ermalinda Barrios, PA on 11/26 at 9:00 AM.

## 2017-07-14 LAB — VITAMIN D 1,25 DIHYDROXY
VITAMIN D 1, 25 (OH) TOTAL: 50 pg/mL (ref 18–72)
Vitamin D2 1, 25 (OH)2: <8 pg/mL
Vitamin D3 1, 25 (OH)2: 50 pg/mL

## 2017-07-25 ENCOUNTER — Encounter: Payer: Self-pay | Admitting: Physician Assistant

## 2017-07-25 ENCOUNTER — Ambulatory Visit: Payer: Medicare HMO | Admitting: Physician Assistant

## 2017-07-25 VITALS — BP 110/66 | HR 70 | Resp 16 | Ht 66.0 in | Wt 125.8 lb

## 2017-07-25 DIAGNOSIS — I481 Persistent atrial fibrillation: Secondary | ICD-10-CM | POA: Diagnosis not present

## 2017-07-25 DIAGNOSIS — I4819 Other persistent atrial fibrillation: Secondary | ICD-10-CM

## 2017-07-25 DIAGNOSIS — R002 Palpitations: Secondary | ICD-10-CM

## 2017-07-25 NOTE — Patient Instructions (Addendum)
Medication Instructions:  1. Your physician recommends that you continue on your current medications as directed. Please refer to the Current Medication list given to you today.   Labwork: NONE ORDERED TODAY  Testing/Procedures: NONE ORDERED TODAY  Follow-Up: DR. Radford Pax ON 09/30/17 @ 8:40   Any Other Special Instructions Will Be Listed Below (If Applicable).     If you need a refill on your cardiac medications before your next appointment, please call your pharmacy.

## 2017-07-25 NOTE — Progress Notes (Signed)
Cardiology Office Note    Date:  07/25/2017   ID:  Destiny Sanchez, DOB 01-Apr-1941, MRN [id number]  PCP:  Noralee Space, MD  Cardiologist: Dr. Fransico Him  Chief Complaint  Patient presents with  . Follow-up    History of Present Illness:  Destiny Sanchez is a 76 y.o. female with history of PAF and nonsustained atrial tachycardia chads vas score of 3 on Xarelto.  Also has a history of hypertension, grade 1 DD with normal LVEF 65-70% on echo 10/2016.  Last saw Dr. Radford Pax 04/26/17 and was not tolerating higher dose of Toprol so was changed to Bystolic 5 mg daily.  Patient's Bystolic was recently changed to diltiazem by Dr. Lenna Gilford.  She is here for follow-up of this.  Overall she is doing well on the diltiazem.  She had 2 episodes while driving and heavy traffic of anxiety and pounding heart rate that eased with deep breathing and relaxation.  She did not have to pull over.  She was mildly dizzy.  She does not know if it was stress and anxiety or her heart was actually racing.  The one episode occurred before the diltiazem and the second episode after diltiazem but not is severe.  She has had no further incidences in several weeks.  She seems to be tolerating the diltiazem better and is not having the side effects she was having on the beta-blocker.    Past Medical History:  Diagnosis Date  . Adenomatous colon polyp 06/2006  . Allergy   . Anemia   . Anxiety   . Blood transfusion 1967 &1971  . Cancer ([place])    basal cell on nose  . Cataract    bil cataracts removed  . Chronic kidney disease    left kidney had a diverticulum - surgery partial nephrectomy  . Clotting disorder (HCC)    hx of a fib on Xarelto  . GERD (gastroesophageal reflux disease)   . Glaucoma   . History of migraine   . Hx of cardiovascular stress test    a. ETT-Echo (07/2013):  Normal. //  b. ETT-Echo 6/17 - Normal study after maximal exercise  . IBS (irritable bowel syndrome)   . Osteopenia   .  Persistent atrial fibrillation [facility])     Past Surgical History:  Procedure Laterality Date  . ABDOMINAL HYSTERECTOMY  1975  . ABDOMINAL HYSTERECTOMY    . APPENDECTOMY    . CATARACT EXTRACTION W/ INTRAOCULAR LENS  IMPLANT, BILATERAL    . COLONOSCOPY    . HEMORROIDECTOMY  1973  . MOHS SURGERY    . PARTIAL NEPHRECTOMY  1971   diverticulum on left  . UPPER GASTROINTESTINAL ENDOSCOPY      Current Medications: Current Meds  Medication Sig  . ALPRAZolam (XANAX) 0.5 MG tablet Take 0.5 mg by mouth as needed for anxiety.  . brinzolamide (AZOPT) 1 % ophthalmic suspension Place 1 drop into both eyes 3 (three) times daily.   . Calcium Carbonate-Vitamin D (CALTRATE 600+D PO) Take 1,200 mg by mouth daily.    . Cholecalciferol (VITAMIN D) 2000 UNITS tablet Take 2,000 Units by mouth daily.    Marland Kitchen diltiazem (CARDIZEM CD) 120 MG 24 hr capsule Take 1 capsule (120 mg total) daily by mouth.  . latanoprost (XALATAN) 0.005 % ophthalmic solution Place 1 drop into both eyes at bedtime.  Marland Kitchen PROLIA 60 MG/ML SOLN injection as directed.  . rivaroxaban (XARELTO) 20 MG TABS tablet Take 1 tablet (20 mg total) by mouth  daily with supper.   Current Facility-Administered Medications for the 07/25/17 encounter (Office Visit) with Imogene Burn, PA-C  Medication  . 0.9 %  sodium chloride infusion     Allergies:   Patient has no known allergies.   Social History   Socioeconomic History  . Marital status: Married    Spouse name: None  . Number of children: None  . Years of education: None  . Highest education level: None  Social Needs  . Financial resource strain: None  . Food insecurity - worry: None  . Food insecurity - inability: None  . Transportation needs - medical: None  . Transportation needs - non-medical: None  Occupational History  . None  Tobacco Use  . Smoking status: Former Smoker    Packs/day: 1.00    Years: 10.00    Pack years: 10.00    Types: Cigarettes    Last attempt to quit:  08/30/1962    Years since quitting: 54.9  . Smokeless tobacco: Never Used  Substance and Sexual Activity  . Alcohol use: Yes    Alcohol/week: 0.6 oz    Types: 1 Glasses of wine per week  . Drug use: No  . Sexual activity: None  Other Topics Concern  . None  Social History Narrative  . None     Family History:  The patient's family history includes Colon cancer in her maternal aunt; Throat cancer in her father.   ROS:   Please see the history of present illness.    Review of Systems  Constitution: Negative.  HENT: Negative.   Eyes: Negative.   Cardiovascular: Positive for dyspnea on exertion, irregular heartbeat and palpitations.  Respiratory: Negative.   Hematologic/Lymphatic: Negative.   Musculoskeletal: Negative.  Negative for joint pain.  Gastrointestinal: Negative.   Genitourinary: Negative.   Neurological: Positive for headaches.   All other systems reviewed and are negative.   PHYSICAL EXAM:   VS:  BP 110/66   Pulse 70   Resp 16   Ht 5\' 6"  (1.676 m)   Wt 125 lb 12.8 oz (57.1 kg)   SpO2 98%   BMI 20.30 kg/m   Physical Exam  GEN: Well nourished, well developed, in no acute distress  Neck: no JVD, carotid bruits, or masses Cardiac:RRR; no murmurs, rubs, or gallops  Respiratory:  clear to auscultation bilaterally, normal work of breathing GI: soft, nontender, nondistended, + BS Ext: without cyanosis, clubbing, or edema, Good distal pulses bilaterally Neuro:  Alert and Oriented x 3 Psych: euthymic mood, full affect  Wt Readings from Last 3 Encounters:  07/25/17 125 lb 12.8 oz (57.1 kg)  07/11/17 123 lb (55.8 kg)  04/26/17 123 lb 12.8 oz (56.2 kg)      Studies/Labs Reviewed:   EKG:  EKG is  ordered today.  The ekg ordered today demonstrates NSR with PAC's, PVC's  Recent Labs: 11/24/2016: NT-Pro BNP 331 07/11/2017: ALT 17; BUN 15; Creatinine, Ser 0.57; Hemoglobin 14.7; Platelets 436.0; Potassium 4.1; Sodium 141; TSH 3.62   Lipid Panel    Component  Value Date/Time   CHOL 183 07/11/2017 0952   TRIG 97.0 07/11/2017 0952   HDL 54.80 07/11/2017 0952   CHOLHDL 3 07/11/2017 0952   VLDL 19.4 07/11/2017 0952   LDLCALC 109 (H) 07/11/2017 0952   LDLDIRECT 127.0 03/11/2010 0910    Additional studies/ records that were reviewed today include:  2D echo 3/21/18Study Conclusions   - Left ventricle: The cavity size was normal. Wall thickness was   increased  in a pattern of mild LVH. Systolic function was   vigorous. The estimated ejection fraction was in the range of 65%   to 70%. Wall motion was normal; there were no regional wall   motion abnormalities. Doppler parameters are consistent with   abnormal left ventricular relaxation (grade 1 diastolic   dysfunction). The E/e&' ratio is between 8-15, suggesting   indeterminate LV filling pressure. - Mitral valve: Mildly thickened leaflets . There was mild   regurgitation. - Left atrium: The atrium was normal in size. - Right atrium: The atrium was normal in size. - Tricuspid valve: There was mild regurgitation. - Pulmonary arteries: PA peak pressure: 29 mm Hg (S). - Inferior vena cava: The vessel was normal in size. The   respirophasic diameter changes were in the normal range (= 50%),   consistent with normal central venous pressure.   Impressions:   - LVEF 65-70%, mild LVH, normal wall motion, diastolic dysfunction,   indetermiante LV filling pressure, mild MR, normal LA size, mild   TR, RVSP 29 mmHG, normal IVC.       ASSESSMENT:    1. Persistent atrial fibrillation (HCC)   2. Palpitations      PLAN:  In order of problems listed above:  Persistent atrial fib on Xarelto. Didn't tolerate bystolic or toprol. Now doing better on diltiazem 120 mg daily. Continue this dose and call if further palpitations and we can titrate up. F/u with Dr. Radford Pax in 2 months.  Palpitations improved on diltiazem.    Medication Adjustments/Labs and Tests Ordered: Current medicines are  reviewed at length with the patient today.  Concerns regarding medicines are outlined above.  Medication changes, Labs and Tests ordered today are listed in the Patient Instructions below. Patient Instructions  Medication Instructions:  1. Your physician recommends that you continue on your current medications as directed. Please refer to the Current Medication list given to you today.   Labwork: NONE ORDERED TODAY  Testing/Procedures: NONE ORDERED TODAY  Follow-Up: DR. Radford Pax   Any Other Special Instructions Will Be Listed Below (If Applicable).     If you need a refill on your cardiac medications before your next appointment, please call your pharmacy.      Sumner Boast, PA-C  07/25/2017 9:33 AM    [facility] [address], [place], [place]  [address] Phone: [phone]; Fax: [phone]

## 2017-09-19 DIAGNOSIS — M23252 Derangement of posterior horn of lateral meniscus due to old tear or injury, left knee: Secondary | ICD-10-CM | POA: Diagnosis not present

## 2017-09-19 DIAGNOSIS — M25561 Pain in right knee: Secondary | ICD-10-CM | POA: Diagnosis not present

## 2017-09-26 ENCOUNTER — Telehealth: Payer: Self-pay | Admitting: Cardiology

## 2017-09-26 DIAGNOSIS — E559 Vitamin D deficiency, unspecified: Secondary | ICD-10-CM | POA: Insufficient documentation

## 2017-09-26 NOTE — Telephone Encounter (Signed)
Returned call to patient. Patient stated she woke up last night due to severe left leg/ankle cramp so she went to the bathroom to walk it out. While in the bathroom she felt nausea, lightheaded tried to walk back to sit on the sofa and she passed out. Patient stated she hit her head on the wall. Her husband recorded her BP at 95/50. Patient states Dr. Lenna Gilford started her on cardizem 120mg  once a day back in November. She states for the past 3 weeks she has only been taking 2 times a week because it makes her feel nausea and lightheaded. She also states that her HR is irregular. Patient states she feels okay today, she has not taken any meds today, BP this morning was 119/64 HR 94. Patient has a follow appt with Dr. Radford Pax on 09/30/17. Informed her I would forward to Dr.Turner for further recommendation and to keep follow up appt on 09/30/17. Patient verbalized understanding and thanked me for the call.

## 2017-09-26 NOTE — Telephone Encounter (Signed)
Event sounds vasovagal triggered by her leg cramp.  Please get her in to see her PCP to evaluate BP further and check EKG

## 2017-09-26 NOTE — Telephone Encounter (Signed)
New Message  STAT if patient feels like he/she is going to faint   1) Are you dizzy now? No   2) Do you feel faint or have you passed out? No   3) Do you have any other symptoms? lightheadedness is the only other symptom  4) Have you checked your HR and BP (record if available)? BP was 94/54 around 4am this morning.   Patient states that around 4am this morning she felt very dizzy and felt she was going to pass out. She became very sick to her stomach. Currently she feels better but still a little lightheaded.

## 2017-09-27 NOTE — Telephone Encounter (Signed)
Left message on VM regarding Dr. Theodosia Blender recommendation to follow up with PCP to check BP and EKG. Informed patient to call back with any questions or concerns.

## 2017-09-28 NOTE — Progress Notes (Signed)
Cardiology Office Note:    Date:  09/30/2017   ID:  Destiny Sanchez, DOB 11-18-1940, MRN [id number]  PCP:  Noralee Space, MD  Cardiologist:  No primary care provider on file.    Referring MD: Noralee Space, MD   Chief Complaint  Patient presents with  . Atrial Fibrillation    History of Present Illness:    Destiny Sanchez is a 77 y.o. female with a hx of  PAF and nonsustained atrial tachycardia with a Chads Vasc score of3 on Xarelto 20 mg daily for anticoagulation. She also has a h/o has a history of hyperlipidemia and diastolic dysfunction. Mostrecent echocardiogram 11/17/2016 demonstrated normal left ventricular systolic function with an estimated ejection fraction of 40-34%, Grade 1diastolic dysfunction and mild MR and TR. She is intolerant to Bystolic or Toprol.  She is here today for followup and is doing well.  She denies any chest pain or pressure, SOB, DOE, PND, orthopnea, LE edema,  or syncope.  she had a recent syncopal event in the setting of getting a severe leg cramp in bed and after getting up and walking she got very nauseated with diaphoresis and passed out.  She had a similar episode a few year back while having a procedure done on her eye.  She has not tolerated the Cardizem for afib suppression as she says that it makes her nauseated so she has not been taking it and has noticed a lot more palpitations.  She is compliant with her Xarelto and is with no bleeding problems.     Past Medical History:  Diagnosis Date  . Adenomatous colon polyp 06/2006  . Allergy   . Anemia   . Anxiety   . Blood transfusion 1967 &1971  . Cancer ([place])    basal cell on nose  . Cataract    bil cataracts removed  . Chronic kidney disease    left kidney had a diverticulum - surgery partial nephrectomy  . Clotting disorder (HCC)    hx of a fib on Xarelto  . GERD (gastroesophageal reflux disease)   . Glaucoma   . History of migraine   . Hx of cardiovascular stress test    a. ETT-Echo (07/2013):  Normal. //  b. ETT-Echo 6/17 - Normal study after maximal exercise  . IBS (irritable bowel syndrome)   . Osteopenia   . Persistent atrial fibrillation ([place])   . Vasovagal syncope 09/30/2017    Past Surgical History:  Procedure Laterality Date  . ABDOMINAL HYSTERECTOMY  1975  . ABDOMINAL HYSTERECTOMY    . APPENDECTOMY    . CATARACT EXTRACTION W/ INTRAOCULAR LENS  IMPLANT, BILATERAL    . COLONOSCOPY    . HEMORROIDECTOMY  1973  . MOHS SURGERY    . PARTIAL NEPHRECTOMY  1971   diverticulum on left  . UPPER GASTROINTESTINAL ENDOSCOPY      Current Medications: Current Meds  Medication Sig  . ALPRAZolam (XANAX) 0.5 MG tablet Take 0.5 mg by mouth as needed for anxiety.  . brinzolamide (AZOPT) 1 % ophthalmic suspension Place 1 drop into both eyes 3 (three) times daily.   . Calcium Carbonate-Vitamin D (CALTRATE 600+D PO) Take 1,200 mg by mouth daily.    . Cholecalciferol (VITAMIN D) 2000 UNITS tablet Take 2,000 Units by mouth daily.    Marland Kitchen diltiazem (CARDIZEM CD) 120 MG 24 hr capsule Take 1 capsule (120 mg total) daily by mouth.  . latanoprost (XALATAN) 0.005 % ophthalmic solution Place 1 drop into both eyes  at bedtime.  Marland Kitchen PROLIA 60 MG/ML SOLN injection as directed.  . rivaroxaban (XARELTO) 20 MG TABS tablet Take 1 tablet (20 mg total) by mouth daily with supper.   Current Facility-Administered Medications for the 09/30/17 encounter (Office Visit) with Sueanne Margarita, MD  Medication  . 0.9 %  sodium chloride infusion     Allergies:   Patient has no known allergies.   Social History   Socioeconomic History  . Marital status: Married    Spouse name: None  . Number of children: None  . Years of education: None  . Highest education level: None  Social Needs  . Financial resource strain: None  . Food insecurity - worry: None  . Food insecurity - inability: None  . Transportation needs - medical: None  . Transportation needs - non-medical: None  Occupational  History  . None  Tobacco Use  . Smoking status: Former Smoker    Packs/day: 1.00    Years: 10.00    Pack years: 10.00    Types: Cigarettes    Last attempt to quit: 08/30/1962    Years since quitting: 55.1  . Smokeless tobacco: Never Used  Substance and Sexual Activity  . Alcohol use: Yes    Alcohol/week: 0.6 oz    Types: 1 Glasses of wine per week  . Drug use: No  . Sexual activity: None  Other Topics Concern  . None  Social History Narrative  . None     Family History: The patient's family history includes Colon cancer in her maternal aunt; Throat cancer in her father. There is no history of Esophageal cancer, Pancreatic cancer, Rectal cancer, or Stomach cancer.  ROS:   Please see the history of present illness.    ROS  All other systems reviewed and negative.   EKGs/Labs/Other Studies Reviewed:    The following studies were reviewed today: none  EKG:  EKG is not ordered today.    Recent Labs: 11/24/2016: NT-Pro BNP 331 07/11/2017: ALT 17; BUN 15; Creatinine, Ser 0.57; Hemoglobin 14.7; Platelets 436.0; Potassium 4.1; Sodium 141; TSH 3.62   Recent Lipid Panel    Component Value Date/Time   CHOL 183 07/11/2017 0952   TRIG 97.0 07/11/2017 0952   HDL 54.80 07/11/2017 0952   CHOLHDL 3 07/11/2017 0952   VLDL 19.4 07/11/2017 0952   LDLCALC 109 (H) 07/11/2017 0952   LDLDIRECT 127.0 03/11/2010 0910    Physical Exam:    VS:  BP 122/68   Pulse 80   Ht 5\' 6"  (1.676 m)   Wt 121 lb 6.4 oz (55.1 kg)   SpO2 97%   BMI 19.59 kg/m     Wt Readings from Last 3 Encounters:  09/30/17 121 lb 6.4 oz (55.1 kg)  07/25/17 125 lb 12.8 oz (57.1 kg)  07/11/17 123 lb (55.8 kg)     GEN:  Well nourished, well developed in no acute distress HEENT: Normal NECK: No JVD; No carotid bruits LYMPHATICS: No lymphadenopathy CARDIAC: RRR, no murmurs, rubs, gallops RESPIRATORY:  Clear to auscultation without rales, wheezing or rhonchi  ABDOMEN: Soft, non-tender,  non-distended MUSCULOSKELETAL:  No edema; No deformity  SKIN: Warm and dry NEUROLOGIC:  Alert and oriented x 3 PSYCHIATRIC:  Normal affect   ASSESSMENT:    1. Persistent atrial fibrillation (HCC)   2. Vasovagal syncope   3. Vertigo    PLAN:    In order of problems listed above:  1.  Paroxysmal atrial fibrillation - she is maintaining NSR on exam.  She will continue on Xarelto 20mg  daily.  Creatinine clearance 06/2017 was 72.81cc/min.  She is intolerant to BB and now is not tolerating Cardizem due to nausea.  She is having frequent palpitations so I am concerned she is having intermittent PAF and she is symptomatic with it.  I have recommended getting an event monitor to assess her afib burden.  Her last echo showed normal LA size so she should be a good candidate for afib ablation.  I am going to refer her to Dr. Rayann Heman to discuss ablation vs. AAD therapy.  In the mean time I have recommended that if she has palpitations that she takes the Cardiazem PRN.   2.  Vasovagal syncope - she had a recent syncopal event in the setting of getting a severe leg cramp in bed and after getting up and walking she got very nauseated with diaphoresis and passed out.  She had a similar episode a few year back while having a procedure done on her eye.  I reassured her that this is benign and triggered by pain.  I encouraged her to lay down if she starts to get those symptoms and should pass.  No further workup needed at this time.  3.  Vertigo - she has been having intermittent dizziness at night when rolling onto her left side and when she leans her head back getting her hair done.  This is consistent with benign positional vertigo but I encouraged her to talk with her PCP about this.     Medication Adjustments/Labs and Tests Ordered: Current medicines are reviewed at length with the patient today.  Concerns regarding medicines are outlined above.  Orders Placed This Encounter  Procedures  . CARDIAC EVENT  MONITOR   No orders of the defined types were placed in this encounter.   Signed, Fransico Him, MD  09/30/2017 9:22 AM    [facility]

## 2017-09-30 ENCOUNTER — Ambulatory Visit: Payer: Medicare HMO | Admitting: Cardiology

## 2017-09-30 ENCOUNTER — Encounter: Payer: Self-pay | Admitting: Cardiology

## 2017-09-30 VITALS — BP 122/68 | HR 80 | Ht 66.0 in | Wt 121.4 lb

## 2017-09-30 DIAGNOSIS — I481 Persistent atrial fibrillation: Secondary | ICD-10-CM

## 2017-09-30 DIAGNOSIS — R42 Dizziness and giddiness: Secondary | ICD-10-CM | POA: Diagnosis not present

## 2017-09-30 DIAGNOSIS — I4819 Other persistent atrial fibrillation: Secondary | ICD-10-CM

## 2017-09-30 DIAGNOSIS — R55 Syncope and collapse: Secondary | ICD-10-CM

## 2017-09-30 HISTORY — DX: Syncope and collapse: R55

## 2017-09-30 NOTE — Patient Instructions (Signed)
Medication Instructions:  Your physician recommends that you continue on your current medications as directed. Please refer to the Current Medication list given to you today.  If you need a refill on your cardiac medications, please contact your pharmacy first.  Labwork: None ordered   Testing/Procedures: Your physician has recommended that you wear an event monitor. Event monitors are medical devices that record the heart's electrical activity. Doctors most often [place] these monitors to diagnose arrhythmias. Arrhythmias are problems with the speed or rhythm of the heartbeat. The monitor is a small, portable device. You can wear one while you do your normal daily activities. This is usually used to diagnose what is causing palpitations/syncope (passing out).   Follow-Up: Your physician wants you to follow-up in: 6 months with Dr. Radford Pax. You will receive a reminder letter in the mail two months in advance. If you don't receive a letter, please call our office to schedule the follow-up appointment.  Your physician has referred you to Dr. Rayann Heman.   Any Other Special Instructions Will Be Listed Below (If Applicable).     If you need a refill on your cardiac medications before your next appointment, please call your pharmacy.

## 2017-10-05 ENCOUNTER — Other Ambulatory Visit: Payer: Self-pay | Admitting: Cardiology

## 2017-10-05 ENCOUNTER — Ambulatory Visit (INDEPENDENT_AMBULATORY_CARE_PROVIDER_SITE_OTHER): Payer: Medicare HMO

## 2017-10-05 DIAGNOSIS — I4819 Other persistent atrial fibrillation: Secondary | ICD-10-CM

## 2017-10-05 DIAGNOSIS — R55 Syncope and collapse: Secondary | ICD-10-CM

## 2017-10-05 DIAGNOSIS — I481 Persistent atrial fibrillation: Secondary | ICD-10-CM | POA: Diagnosis not present

## 2017-10-24 ENCOUNTER — Ambulatory Visit: Payer: Medicare HMO | Admitting: Internal Medicine

## 2017-10-24 ENCOUNTER — Encounter: Payer: Self-pay | Admitting: Internal Medicine

## 2017-10-24 VITALS — BP 122/64 | HR 82 | Ht 66.0 in | Wt 125.0 lb

## 2017-10-24 DIAGNOSIS — I471 Supraventricular tachycardia: Secondary | ICD-10-CM

## 2017-10-24 DIAGNOSIS — R002 Palpitations: Secondary | ICD-10-CM

## 2017-10-24 DIAGNOSIS — R55 Syncope and collapse: Secondary | ICD-10-CM | POA: Diagnosis not present

## 2017-10-24 MED ORDER — METOPROLOL TARTRATE 25 MG PO TABS: 12.5000 mg | ORAL_TABLET | Freq: Two times a day (BID) | ORAL | 3 refills | Status: DC

## 2017-10-24 NOTE — Patient Instructions (Addendum)
Medication Instructions:  Your physician has recommended you make the following change in your medication:  1.  Stop taking diltiazem. 2.  Start taking metoprolol tartrate 25 mg (1/2 tablet) by mouth twice a day.  Labwork: None ordered.  Testing/Procedures: None ordered.  Follow-Up: Your physician wants you to follow-up in: 2 months with Dr. Rayann Heman.     Any Other Special Instructions Will Be Listed Below (If Applicable).  If you need a refill on your cardiac medications before your next appointment, please call your pharmacy.  Metoprolol tablets What is this medicine? METOPROLOL (me TOE proe lole) is a beta-blocker. Beta-blockers reduce the workload on the heart and help it to beat more regularly. This medicine is used to treat high blood pressure and to prevent chest pain. It is also used to after a heart attack and to prevent an additional heart attack from occurring. This medicine may be used for other purposes; ask your health care provider or pharmacist if you have questions. COMMON BRAND NAME(S): Lopressor What should I tell my health care provider before I take this medicine? They need to know if you have any of these conditions: -diabetes -heart or vessel disease like slow heart rate, worsening heart failure, heart block, sick sinus syndrome or Raynaud's disease -kidney disease -liver disease -lung or breathing disease, like asthma or emphysema -pheochromocytoma -thyroid disease -an unusual or allergic reaction to metoprolol, other beta-blockers, medicines, foods, dyes, or preservatives -pregnant or trying to get pregnant -breast-feeding How should I use this medicine? Take this medicine by mouth with a drink of water. Follow the directions on the prescription label. Take this medicine immediately after meals. Take your doses at regular intervals. Do not take more medicine than directed. Do not stop taking this medicine suddenly. This could lead to serious heart-related  effects. Talk to your pediatrician regarding the use of this medicine in children. Special care may be needed. Overdosage: If you think you have taken too much of this medicine contact a poison control center or emergency room at once. NOTE: This medicine is only for you. Do not share this medicine with others. What if I miss a dose? If you miss a dose, take it as soon as you can. If it is almost time for your next dose, take only that dose. Do not take double or extra doses. What may interact with this medicine? This medicine may interact with the following medications: -certain medicines for blood pressure, heart disease, irregular heart beat -certain medicines for depression like monoamine oxidase (MAO) inhibitors, fluoxetine, or paroxetine -clonidine -dobutamine -epinephrine -isoproterenol -reserpine This list may not describe all possible interactions. Give your health care provider a list of all the medicines, herbs, non-prescription drugs, or dietary supplements you use. Also tell them if you smoke, drink alcohol, or use illegal drugs. Some items may interact with your medicine. What should I watch for while using this medicine? Visit your doctor or health care professional for regular check ups. Contact your doctor right away if your symptoms worsen. Check your blood pressure and pulse rate regularly. Ask your health care professional what your blood pressure and pulse rate should be, and when you should contact them. You may get drowsy or dizzy. Do not drive, use machinery, or do anything that needs mental alertness until you know how this medicine affects you. Do not sit or stand up quickly, especially if you are an older patient. This reduces the risk of dizzy or fainting spells. Contact your doctor if these  symptoms continue. Alcohol may interfere with the effect of this medicine. Avoid alcoholic drinks. What side effects may I notice from receiving this medicine? Side effects that  you should report to your doctor or health care professional as soon as possible: -allergic reactions like skin rash, itching or hives -cold or numb hands or feet -depression -difficulty breathing -faint -fever with sore throat -irregular heartbeat, chest pain -rapid weight gain -swollen legs or ankles Side effects that usually do not require medical attention (report to your doctor or health care professional if they continue or are bothersome): -anxiety or nervousness -change in sex drive or performance -dry skin -headache -nightmares or trouble sleeping -short term memory loss -stomach upset or diarrhea -unusually tired This list may not describe all possible side effects. Call your doctor for medical advice about side effects. You may report side effects to FDA at 1-800-FDA-1088. Where should I keep my medicine? Keep out of the reach of children. Store at room temperature between 15 and 30 degrees C (59 and 86 degrees F). Throw away any unused medicine after the expiration date. NOTE: This sheet is a summary. It may not cover all possible information. If you have questions about this medicine, talk to your doctor, pharmacist, or health care provider.  2018 Elsevier/Gold Standard (2013-04-20 14:40:36)

## 2017-10-24 NOTE — Progress Notes (Signed)
Electrophysiology Office Note   Date:  10/26/2017   ID:  Shacarra, Choe 01/03/1941, MRN [id number]  PCP:  Noralee Space, MD  Cardiologist:  Dr Radford Pax Primary Electrophysiologist: Thompson Grayer, MD    CC: afib   History of Present Illness: Destiny Sanchez is a 77 y.o. female who presents today for electrophysiology evaluation.   She presents on referral by Dr Radford Pax for EP consultation regarding afib.  The patient initially presented to Dr Aundra Dubin in 2011 with symptoms of palpitations. holter monitor was performed which revealed frequent PACs and nonsustained atach (5 beats).  She continues to have palpitations but without close follow-up.  She was then seen by Guy Begin 07/25/13.  She had an event monitor placed.  I have personally reviewed this monitor which reveals ectopic atrial tachycardia of short duration.  Based on this monitor, she was felt by Dr Aundra Dubin to have afib and xarelto was started.  She was placed on toprol and did well. She saw Dr Radford Pax 09/2016 for worsening palpitations and SOB.  Event monitor was placed which revealed sinus rhythm with PACs and nonsustained atach.  No afib was seen.  Her toprol was increased.  She did not tolerate higher dosing of toprol due to fatigue and depression.  Her toprol was changed to bystolic.  She did not tolerate bystolic and was thus placed on diltiazem. She recently had an episode of vagal syncope for which she was evaluated by Dr Radford Pax.  She is wearing a cardiac monitor at this time.  Premature atrial contractions and nonsustained atach have been observed but no afib.  She reports having nausea with diltiazem.  Today, she denies symptoms of chest pain, shortness of breath, orthopnea, PND, lower extremity edema, claudication, further syncope, bleeding, or neurologic sequela. The patient is tolerating medications without difficulties and is otherwise without complaint today.    Past Medical History:  Diagnosis Date  .  Adenomatous colon polyp 06/2006  . Allergy   . Anemia   . Anxiety   . Blood transfusion 1967 &1971  . Cancer ([place])    basal cell on nose  . Cataract    bil cataracts removed  . Chronic kidney disease    left kidney had a diverticulum - surgery partial nephrectomy  . Ectopic atrial tachycardia ([facility])    nonsustained  . GERD (gastroesophageal reflux disease)   . Glaucoma   . History of migraine   . Hx of cardiovascular stress test    a. ETT-Echo (07/2013):  Normal. //  b. ETT-Echo 6/17 - Normal study after maximal exercise  . IBS (irritable bowel syndrome)   . Osteopenia   . Vasovagal syncope 09/30/2017   Past Surgical History:  Procedure Laterality Date  . ABDOMINAL HYSTERECTOMY  1975  . ABDOMINAL HYSTERECTOMY    . APPENDECTOMY    . CATARACT EXTRACTION W/ INTRAOCULAR LENS  IMPLANT, BILATERAL    . COLONOSCOPY    . HEMORROIDECTOMY  1973  . MOHS SURGERY    . PARTIAL NEPHRECTOMY  1971   diverticulum on left  . UPPER GASTROINTESTINAL ENDOSCOPY       Current Outpatient Medications  Medication Sig Dispense Refill  . ALPRAZolam (XANAX) 0.5 MG tablet Take 0.5 mg by mouth as needed for anxiety.    . brinzolamide (AZOPT) 1 % ophthalmic suspension Place 1 drop into both eyes 3 (three) times daily.     . Calcium Carbonate-Vitamin D (CALTRATE 600+D PO) Take 1,200 mg by mouth daily.      Marland Kitchen  Cholecalciferol (VITAMIN D) 2000 UNITS tablet Take 2,000 Units by mouth daily.      Marland Kitchen latanoprost (XALATAN) 0.005 % ophthalmic solution Place 1 drop into both eyes at bedtime.    Marland Kitchen PROLIA 60 MG/ML SOLN injection as directed.    . rivaroxaban (XARELTO) 20 MG TABS tablet Take 1 tablet (20 mg total) by mouth daily with supper. 90 tablet 3  . metoprolol tartrate (LOPRESSOR) 25 MG tablet Take 0.5 tablets (12.5 mg total) by mouth 2 (two) times daily. 90 tablet 3   Current Facility-Administered Medications  Medication Dose Route Frequency Provider Last Rate Last Dose  . 0.9 %  sodium chloride infusion  500  mL Intravenous Continuous Ladene Artist, MD        Allergies:   Patient has no known allergies.   Social History:  The patient  reports that she quit smoking about 55 years ago. Her smoking use included cigarettes. She has a 10.00 pack-year smoking history. she has never used smokeless tobacco. She reports that she drinks about 0.6 oz of alcohol per week. She reports that she does not use drugs.   Family History:  The patient's  family history includes Colon cancer in her maternal aunt; Throat cancer in her father.    ROS:  Please see the history of present illness.   All other systems are personally reviewed and negative.    PHYSICAL EXAM: VS:  BP 122/64   Pulse 82   Ht 5\' 6"  (1.676 m)   Wt 125 lb (56.7 kg)   BMI 20.18 kg/m  , BMI Body mass index is 20.18 kg/m. GEN: Well nourished, well developed, in no acute distress  HEENT: normal  Neck: no JVD, carotid bruits, or masses Cardiac: RRR; no murmurs, rubs, or gallops,no edema  Respiratory:  clear to auscultation bilaterally, normal work of breathing GI: soft, nontender, nondistended, + BS MS: no deformity or atrophy  Skin: warm and dry  Neuro:  Strength and sensation are intact Psych: euthymic mood, full affect  EKG:  EKG is ordered today. The ekg ordered today is personally reviewed and shows sinus rhythm 82 bpm, PR 144 msec, QRS 70 msec, Qtc 436 msec  Over 50 pages of event monitor (she is still wearing) are also reviewed. This reveals primarily sinus rhythm with rare nonsustained ectopic atrial tachycardia   Recent Labs: 11/24/2016: NT-Pro BNP 331 07/11/2017: ALT 17; BUN 15; Creatinine, Ser 0.57; Hemoglobin 14.7; Platelets 436.0; Potassium 4.1; Sodium 141; TSH 3.62  personally reviewed   Lipid Panel     Component Value Date/Time   CHOL 183 07/11/2017 0952   TRIG 97.0 07/11/2017 0952   HDL 54.80 07/11/2017 0952   CHOLHDL 3 07/11/2017 0952   VLDL 19.4 07/11/2017 0952   LDLCALC 109 (H) 07/11/2017 0952   LDLDIRECT  127.0 03/11/2010 0910   personally reviewed   Wt Readings from Last 3 Encounters:  10/24/17 125 lb (56.7 kg)  09/30/17 121 lb 6.4 oz (55.1 kg)  07/25/17 125 lb 12.8 oz (57.1 kg)      Other studies personally reviewed: Additional studies/ records that were reviewed today include: prior echo, Dr Landis Gandy notes, prior ekgs, event monitors  Review of the above records today demonstrates: as above   ASSESSMENT AND PLAN:  1.  Palpitations due to PACs/ nonsustained atach The patient has symptomatic atrial arrhythmias.  She has not tolerated increased beta blockers and CCBs.  I have reviewed her prior holter and 3 event monitors (including the one she is  wearing now) personally.  I do not see that she has had afib documented.  She has only had nonsustained atrial tachycardia. I will continue to follow up on current monitor being worn.  If no afib is detected, I think that we should stop anticoagulation upon return.  We could consider implantable loop recorder placement for long term management of her atrial arrhythmias/ palpitations.  I would not advise any additional external monitors in the future.  Stop diltiazem at this time as she does not feel that she is tolerating this well.  Therapeutic strategies for ectopic atrial tachycardia including medicine and ablation were discussed in detail with the patient today. Risk, benefits, and alternatives to EP study and radiofrequency ablation were also discussed in detail today. At this time, she is clear that she would prefer to avoid ablation.  She prefers a more conservative approach. She states "I did very well for years on low dose metoprolol.  I would like to just go back to this" I have started metoprolol 12.5mg  BID per her preference today.  It appears that she was actually on toprol xl before.  We can make this change if needed. Would consider flecainide or multaq as additional options. Given the nonsustained nature of her Atach, it may be  hard to 3D map.  I would therefore advise AAD therapy prior to ablation. Stress reduction, adequate sleep, and yoga were discussed as options.  Follow-up:  2 months with me See Dr Radford Pax as scheduled  Current medicines are reviewed at length with the patient today.   The patient does not have concerns regarding her medicines.  The following changes were made today:  none  Labs/ tests ordered today include:  Orders Placed This Encounter  Procedures  . EKG 12-Lead     Signed, Thompson Grayer, MD    [address] [place] [address] [phone] (office) [phone] (fax)

## 2017-10-26 ENCOUNTER — Encounter: Payer: Self-pay | Admitting: Internal Medicine

## 2017-11-01 DIAGNOSIS — M25561 Pain in right knee: Secondary | ICD-10-CM | POA: Insufficient documentation

## 2017-11-09 ENCOUNTER — Telehealth: Payer: Self-pay | Admitting: Cardiology

## 2017-11-09 NOTE — Telephone Encounter (Signed)
Follow Up:     . Returning your call, 

## 2017-11-09 NOTE — Telephone Encounter (Signed)
Notes recorded by Teressa Senter, RN on 11/09/2017 at 1:00 PM EDT Patient made aware of event monitor results. Patient verbalized understanding and thankful for the call. Results forwarded to Dr. Rayann Heman for review.  Notes recorded by Sueanne Margarita, MD on 11/08/2017 at 9:40 PM EDT Heart monitor showed no afib and only PAT - please forward this to Dr. Rayann Heman for review

## 2017-12-15 DIAGNOSIS — M810 Age-related osteoporosis without current pathological fracture: Secondary | ICD-10-CM | POA: Diagnosis not present

## 2017-12-23 DIAGNOSIS — Z124 Encounter for screening for malignant neoplasm of cervix: Secondary | ICD-10-CM | POA: Diagnosis not present

## 2017-12-23 DIAGNOSIS — Z1231 Encounter for screening mammogram for malignant neoplasm of breast: Secondary | ICD-10-CM | POA: Diagnosis not present

## 2017-12-26 ENCOUNTER — Encounter: Payer: Self-pay | Admitting: Internal Medicine

## 2017-12-26 ENCOUNTER — Ambulatory Visit: Payer: Medicare HMO | Admitting: Internal Medicine

## 2017-12-26 ENCOUNTER — Encounter: Payer: Self-pay | Admitting: *Deleted

## 2017-12-26 VITALS — BP 124/72 | HR 76 | Ht 66.0 in | Wt 123.0 lb

## 2017-12-26 DIAGNOSIS — I471 Supraventricular tachycardia: Secondary | ICD-10-CM | POA: Diagnosis not present

## 2017-12-26 NOTE — Patient Instructions (Signed)
Medication Instructions:  Your physician has recommended you make the following change in your medication: 1. STOP Xarelto  Labwork: None ordered  Testing/Procedures: None ordered  Follow-Up: Your physician wants you to follow-up in: 6 months with Dr. Rayann Heman.  You will receive a reminder letter in the mail two months in advance. If you don't receive a letter, please call our office to schedule the follow-up appointment.  * If you need a refill on your cardiac medications before your next appointment, please call your pharmacy.   *Please note that any paperwork needing to be filled out by the provider will need to be addressed at the front desk prior to seeing the provider. Please note that any FMLA, disability or other documents regarding health condition is subject to a $25.00 charge that must be received prior to completion of paperwork in the form of a money order or check.  Thank you for choosing [facility]!!

## 2017-12-26 NOTE — Progress Notes (Signed)
PCP: Noralee Space, MD Primary Cardiologist: Dr Radford Pax Primary EP: Dr Rayann Heman  Destiny Sanchez is a 77 y.o. female who presents today for routine electrophysiology followup.  Since last being seen in our clinic, the patient reports doing very well.  Her palpitations are well controlled.  Today, she denies symptoms of chest pain, shortness of breath,  lower extremity edema, dizziness, presyncope, or syncope.  The patient is otherwise without complaint today.   Past Medical History:  Diagnosis Date  . Adenomatous colon polyp 06/2006  . Allergy   . Anemia   . Anxiety   . Blood transfusion 1967 &1971  . Cancer ([place])    basal cell on nose  . Cataract    bil cataracts removed  . Chronic kidney disease    left kidney had a diverticulum - surgery partial nephrectomy  . Ectopic atrial tachycardia ([facility])    nonsustained  . GERD (gastroesophageal reflux disease)   . Glaucoma   . History of migraine   . Hx of cardiovascular stress test    a. ETT-Echo (07/2013):  Normal. //  b. ETT-Echo 6/17 - Normal study after maximal exercise  . IBS (irritable bowel syndrome)   . Osteopenia   . Vasovagal syncope 09/30/2017   Past Surgical History:  Procedure Laterality Date  . ABDOMINAL HYSTERECTOMY  1975  . ABDOMINAL HYSTERECTOMY    . APPENDECTOMY    . CATARACT EXTRACTION W/ INTRAOCULAR LENS  IMPLANT, BILATERAL    . COLONOSCOPY    . HEMORROIDECTOMY  1973  . MOHS SURGERY    . PARTIAL NEPHRECTOMY  1971   diverticulum on left  . UPPER GASTROINTESTINAL ENDOSCOPY      ROS- all systems are reviewed and negatives except as per HPI above  Current Outpatient Medications  Medication Sig Dispense Refill  . ALPRAZolam (XANAX) 0.5 MG tablet Take 0.5 mg by mouth as needed for anxiety.    . brinzolamide (AZOPT) 1 % ophthalmic suspension Place 1 drop into both eyes 3 (three) times daily.     . Calcium Carbonate-Vitamin D (CALTRATE 600+D PO) Take 1,200 mg by mouth daily.      . Cholecalciferol (VITAMIN  D) 2000 UNITS tablet Take 2,000 Units by mouth daily.      Marland Kitchen latanoprost (XALATAN) 0.005 % ophthalmic solution Place 1 drop into both eyes at bedtime.    . metoprolol tartrate (LOPRESSOR) 25 MG tablet Take 0.5 tablets (12.5 mg total) by mouth 2 (two) times daily. 90 tablet 3  . PROLIA 60 MG/ML SOLN injection as directed.    . rivaroxaban (XARELTO) 20 MG TABS tablet Take 1 tablet (20 mg total) by mouth daily with supper. 90 tablet 3   Current Facility-Administered Medications  Medication Dose Route Frequency Provider Last Rate Last Dose  . 0.9 %  sodium chloride infusion  500 mL Intravenous Continuous Ladene Artist, MD        Physical Exam: Vitals:   12/26/17 0913  BP: 124/72  Pulse: 76  Weight: 123 lb (55.8 kg)  Height: 5\' 6"  (1.676 m)    GEN- The patient is well appearing, alert and oriented x 3 today.   Head- normocephalic, atraumatic Eyes-  Sclera clear, conjunctiva pink Ears- hearing intact Oropharynx- clear Lungs- Clear to ausculation bilaterally, normal work of breathing Heart- Regular rate and rhythm with some ectopy, no murmurs, rubs or gallops, PMI not laterally displaced GI- soft, NT, ND, + BS Extremities- no clubbing, cyanosis, or edema   Assessment and Plan:  1. Palpitations, PACs, nonsustained Atach Last visit, we restarted metoprolol 12.5mg  BID which she did well with for several years. No sustained afib upon my review of her chart previously OK to stop anticoagulation at this time. Her chads2vasc score is 3.  We discussed pros and cons of long term anticoagulation at length today.  She is clear that she would like to avoid anticoagulation given bleeding risks.   We discussed implantable loop recorder again today as an option to monitor for AF long term.  She is not interested in long term monitoring presently.  We also discussed Apple Watch V4.  She is very interested in this and will consider purchase.  Return in 49months  Thompson Grayer MD,  [facility] 12/26/2017 9:33 AM

## 2017-12-27 DIAGNOSIS — R69 Illness, unspecified: Secondary | ICD-10-CM | POA: Diagnosis not present

## 2018-01-05 ENCOUNTER — Telehealth: Payer: Self-pay | Admitting: Internal Medicine

## 2018-01-05 NOTE — Telephone Encounter (Signed)
Returned call to Pt.  Since Pt saw Dr. Rayann Heman she has gotten an apple watch to monitor her heart rhythm.  Per Pt her watch has told her she has been in afib 3 different times since she got it (Dec 28 2017).  Pt will print these strips and bring them to [facility] for review on Monday 01/09/2018 when Dr. Rayann Heman has returned to office.   Await strips.

## 2018-01-05 NOTE — Telephone Encounter (Signed)
New message   Pt c/o medication issue:  1. Name of Medication: Xarelto  2. How are you currently taking this medication (dosage and times per day)? n/a  3. Are you having a reaction (difficulty breathing--STAT)? n/a  4. What is your medication issue? Since being off Xarelto, patient reports being in afib on 5/2 and 5/6

## 2018-01-09 ENCOUNTER — Telehealth: Payer: Self-pay | Admitting: Internal Medicine

## 2018-01-09 NOTE — Telephone Encounter (Signed)
Walk in Pt Form-Sealed Envelope dropped off. Placed in [place] doc box.

## 2018-01-12 ENCOUNTER — Telehealth: Payer: Self-pay

## 2018-01-12 DIAGNOSIS — I480 Paroxysmal atrial fibrillation: Secondary | ICD-10-CM

## 2018-01-12 MED ORDER — RIVAROXABAN 20 MG PO TABS: 20.0000 mg | ORAL_TABLET | Freq: Every day | ORAL | 3 refills | Status: DC

## 2018-01-12 NOTE — Telephone Encounter (Signed)
Returned call to Pt.  Advised that Dr. Rayann Heman had reviewed her cardiac strips from her Apple watch.  Per Dr. Rayann Heman - "likely Afib".  Dr. Rayann Heman advised Pt to resume taking Xarelto. Pt indicates understanding, will restart Xarelto. Discussed loop recorder with Pt.  Pt will consider loop recorder placement and will discuss with Dr. Rayann Heman at next office visit.

## 2018-01-12 NOTE — Addendum Note (Signed)
Addended by: Willeen Cass A on: 01/12/2018 11:33 AM   Modules accepted: Orders

## 2018-01-16 DIAGNOSIS — H401431 Capsular glaucoma with pseudoexfoliation of lens, bilateral, mild stage: Secondary | ICD-10-CM | POA: Diagnosis not present

## 2018-02-14 DIAGNOSIS — H40053 Ocular hypertension, bilateral: Secondary | ICD-10-CM | POA: Diagnosis not present

## 2018-02-14 DIAGNOSIS — L239 Allergic contact dermatitis, unspecified cause: Secondary | ICD-10-CM | POA: Diagnosis not present

## 2018-02-14 DIAGNOSIS — I4891 Unspecified atrial fibrillation: Secondary | ICD-10-CM | POA: Diagnosis not present

## 2018-02-20 ENCOUNTER — Other Ambulatory Visit (INDEPENDENT_AMBULATORY_CARE_PROVIDER_SITE_OTHER): Payer: Medicare HMO

## 2018-02-20 ENCOUNTER — Encounter: Payer: Self-pay | Admitting: Pulmonary Disease

## 2018-02-20 ENCOUNTER — Ambulatory Visit: Payer: Medicare HMO | Admitting: Pulmonary Disease

## 2018-02-20 VITALS — BP 122/68 | HR 80 | Temp 98.2°F | Ht 66.0 in | Wt 122.2 lb

## 2018-02-20 DIAGNOSIS — K219 Gastro-esophageal reflux disease without esophagitis: Secondary | ICD-10-CM | POA: Diagnosis not present

## 2018-02-20 DIAGNOSIS — R002 Palpitations: Secondary | ICD-10-CM

## 2018-02-20 DIAGNOSIS — Z86010 Personal history of colonic polyps: Secondary | ICD-10-CM | POA: Diagnosis not present

## 2018-02-20 DIAGNOSIS — K589 Irritable bowel syndrome without diarrhea: Secondary | ICD-10-CM | POA: Diagnosis not present

## 2018-02-20 DIAGNOSIS — J301 Allergic rhinitis due to pollen: Secondary | ICD-10-CM

## 2018-02-20 DIAGNOSIS — I481 Persistent atrial fibrillation: Secondary | ICD-10-CM | POA: Diagnosis not present

## 2018-02-20 DIAGNOSIS — R69 Illness, unspecified: Secondary | ICD-10-CM | POA: Diagnosis not present

## 2018-02-20 DIAGNOSIS — M810 Age-related osteoporosis without current pathological fracture: Secondary | ICD-10-CM | POA: Diagnosis not present

## 2018-02-20 DIAGNOSIS — M25561 Pain in right knee: Secondary | ICD-10-CM

## 2018-02-20 DIAGNOSIS — K5730 Diverticulosis of large intestine without perforation or abscess without bleeding: Secondary | ICD-10-CM | POA: Diagnosis not present

## 2018-02-20 DIAGNOSIS — I4819 Other persistent atrial fibrillation: Secondary | ICD-10-CM

## 2018-02-20 DIAGNOSIS — F411 Generalized anxiety disorder: Secondary | ICD-10-CM

## 2018-02-20 LAB — SEDIMENTATION RATE: Sed Rate: 25 mm/hr (ref 0–30)

## 2018-02-20 LAB — URIC ACID: Uric Acid, Serum: 2.4 mg/dL (ref 2.4–7.0)

## 2018-02-20 MED ORDER — METHYLPREDNISOLONE 4 MG PO TBPK: ORAL_TABLET | ORAL | 0 refills | Status: DC

## 2018-02-20 NOTE — Patient Instructions (Addendum)
Today we updated your med list in our EPIC system...    Continue your current medications the same...  Today we checked a Uric level & Sed rate as we discussed...    We will contact you w/ the results when available...   We decided to treat this rash/ arthralgia/ enigma with a Medrol dosepak...  Our next step, should the rash not resolve, is to refer you to a dermatologist for further eval...  Call for any questions or if I can be of service in any way.Marland KitchenMarland Kitchen

## 2018-02-20 NOTE — Progress Notes (Signed)
Subjective:     Patient ID: Destiny Sanchez, female   DOB: [birth year], 77 y.o.   MRN: [id number]  HPI 77 y/o WF here for a follow up visit... SEE PREV EPIC NOTES FOR OLDER DATA>>  Her son is an Musician at [employer], per daughter is a Sport and exercise psychologist...   2DEcho 04/2010 showed EF=60-65%, no RWMA, Gr1DD, PAsys=52mHg  LABS 6/13:  FLP- ok on diet x LDL=108;  Chems- wnl;  CBC- wnl;  TSH=3.53...  ~  July 18, 2013:  793moOArroyo Grandes still c/o palpitations- skips, irregularity, not racing/ lightheaded, dizzy, etc;  She notes that these often occur w/ activity (eg. walking, playing golf, when hot) & may be assoc w/ SOB;  She last saw DrMcLean in 2011 for similar symptoms- at that time she had a 2DEcho (norm LVF & valves), and a Holter (PACs & short run atrial tachy- no worrisome arrhythmias);  They rec elim of caffeine & low dose BBlocker- Metoprolol25- 1/2Bid... She is still quite concerned about these palpitations and we discussed follow up w/ Cards- DrMcLean w/ poss repeat holter/ event recorder & poss treadmill test...     We reviewed prob list, meds, xrays and labs> see below for updates >> she had the 2014 Flu vaccine 10/14...   EKG 11/14 showed NSR, rate70, sm Qs inferiorly, otherw NAD...   CXR 8/14 showed norm heart size, clear lungs, DJD in spine & osteopenia of bones...  LABS 8/14:  Chems- wnl;  CBC- wnl...   ~  August 16, 2104:  79[facility] yellow sput, nasal drainage, & HA; she felt feverish w/ chills and [facility] her; the congestion has persisted & she is not resting;  She also reports a good bit of stress- in [id number] [place] & she had to do CPR, call 911, etc; he spent 2wks in hospw/ pacer/defib placed & he is improved- in rehab, playing golf again etc...     Glaucoma> on drops per ophthalmology & followed regularly...    AR,  Bronchitis> Hx LPR w/ prev eval by DrByers, no longer taking PPI; uses OTC antihist as needed; presented 12/15 w/ URI/ AB=> treated w/ Zithromax, Depo80, Pred taper, Mucinex, Fluids...    AtypCP, Palpit> followed by DrMcLean w/ PAF found on Holter done for palpit; on MetHamiltonhe had neg stress echo 2014; [facility] reviewed    Borderline Chol> on diet alone; FLP 12/15 showed TChol 173, TG 93, HDL 44, LDL 111; rec to continue diet & exercise...    GI- GERD, Divertics, IBS, colon polyps> followed by DrStark- prev on Aciphex; last colon was 11/12 w/ divertics and sm adenoma removed..    Migraine HAs> she had prev eval by DrAdelman, & accupuncture treatment by DrBloom; last treated w/ zoloft from DrFreeman...    Anxiety> under some stress but she's declined anxiolytic rx...     DERM- she has lipoma on her back assessed by CCS & had Ca on  Nose removed by Derm... We reviewed prob list, meds, xrays and labs> see below for updates >>   CXR 12/15 showed norm heart size, clear lungs/ NAD, some hyperinflation suggested by radiology...  LABS 12/15:  FLP- ok w/ LDL=111;  Chems- wnl;  CBC- wnl;  TSH= 3.32...  PLAN>>  she has Bronchitis & AB exac- we will Rx w/  the Zithromax, Deop80, Pred-3d taper, Mucinex, fluids, etc, continue other meds the same...  ~  August 06, 2015:  Yearly [employer] reports a good year- no new complaints or concerns; her husb had sudden cardiac death at home & she did CPR, he was in a coma for 6-7d and is now doing remarkably well w/ an implanted defib now;  She has experienced some anxiety & we discussed Rx w/ alpraz 0.55m 1/2 to 1 tab Tid prn;  She also notes some left shoulder pain & we will set up an Ortho consult w/ DrWainer eHarriett Sine..  We reviewed the following medical problems during today's office visit >> NOTE: one son has celiac dis and one son has DM.    Glaucoma> on drops per ophthalmology & followed regularly...    AR, Bronchitis> Hx LPR w/  prev eval by DrByers, no longer taking PPI; uses OTC antihist as needed; presented 12/15 w/ URI/ AB=> treated w/ Zithromax, Depo80, Pred taper, Mucinex, Fluids...    AtypCP, Palpit> followed by DrMcLean w/ PAF found on Holter done for palpit; on MUnion she had neg stress echo 2014; [facility]    Borderline Chol> on diet alone; FLP 12/16 showed TChol 176, TG 86, HDL 52, LDL 107; rec to continue diet & exercise...    GI- GERD, Divertics, IBS, colon polyps> followed by DrStark- prev on Aciphex; last colon was 11/12 w/ divertics and sm adenoma removed..    Migraine HAs> she had prev eval by DrAdelman, & accupuncture treatment by DrBloom; last treated w/ zoloft from DrFreeman...    Anxiety> under some stress but she's declined anxiolytic rx...     DERM- she has lipoma on her back assessed by CCS & had Ca on nose removed by Derm... EXAM shows Afeb, VSS, O2sat=99% on RA;  HEENT- neg, mallampati2;  Chest- clear w/o w/r/r;  Heart- RR s4 w/o m/r;  Abd- soft, nontender, neg;  Ext- L shoulder pain, w/o c/c/e;  Neuro- intact...  LABS 08/05/16>  FLP- ok on diet alone w/ LDL=107;  Chems- wnl;  CBC- wnl;  TSH=4.69;  VitD=37 IMP/PLAN>>  MAngelais doing satis- she has some anxiety & we dicussed Rx w/ Alpraz 0.578mprn (he had a traumatic event w/ husb's SCD but she saved his life w/ CPR & her quick action; we gave her the 2016 FLU vaccine today; we will arrange for Ortho consult for left shoulder pain...   ~  August 19, 2016:  Yearly ROCastors stable- notes some fatigue to go along w/ her 7714rs, and says she noted some ankle edema last [place] Reminded to start a regular exercise program like Silver Sneakers and to avoid salt/ sodium esp while on vacation, could also use support hose & elevation... She had several follow up visits this past yr>>    She saw CARDS-SWeaver,PA on 01/05/16>  DrMcLean is her cardiologist, hx palpit, PAF, HL; norm  stress echo 11/14 and event recorder revealed several short runs of PAF;     She saw GI-DrStark 08/17/16 (new pt [id number])>  Hx GERD & adenomatous colon polyps but on Protonix & Xarelto for PAF; last colon 11/12 & they plan 5 yr f/u colon soon... We reviewed the following medical problems during today's office visit >>     Glaucoma> on drops per ophthalmology- DrMcCuen & followed regularly...    AR, Bronchitis> Hx LPR w/ prev eval by DrByers, on  PPI- Protonix40 as needed; uses OTC antihist as needed; presented 12/15 w/ URI/ AB=> treated w/ Zithromax, Depo80, Pred taper, Mucinex, Fluids...    AtypCP, Palpit> followed by DrMcLean w/ PAF found on Holter done for palpit; on [place]; she had neg stress echo 2014; last seen by Cards 5/17 as above...    Borderline Chol> on diet alone; FLP 12/17 showed TChol 176, TG 106, HDL 55, LDL 99; rec to continue diet & exercise...    GI- GERD, Divertics, IBS, colon polyps> followed by DrStark- on PPI prn; last colon was 11/12 w/ divertics and sm adenoma removed=> they plan f/u colon soon    Migraine HAs> she had prev eval by DrAdelman, & accupuncture treatment by DrBloom; last treated w/ Zoloft from [place]...    Anxiety> under some stress w/ husb's cardiac issues etc; she has Xanax 0.55m for prn use & takes it at bedtime for rest...    DERM- she has lipoma on her back assessed by CCS & had Ca on nose removed by Derm... EXAM shows Afeb, VSS, O2sat=100% on RA;  HEENT- neg, mallampati2;  Chest- clear w/o w/r/r;  Heart- RR s4 w/o m/r;  Abd- soft, nontender, neg;  Ext- L shoulder pain, w/o c/c/e;  Neuro- intact...  EKG 01/05/16>  NSR, rate88, few PACs, otherw norm EKG...  CXR 08/19/16>  Norm heart size, sl hyperinflation- clear lungs, NAD...  LABS 08/20/16>  FLP- at goals on diet alone;  Chems- wnl;  CBC- wnl;  TSH=4.90 & stable, not on meds;  VitD=36 on 2000u daily supplement... IMP/PLAN>>  MNevaenis stable overall- continue same meds, no salt, &  incr exercise program; we gave her the 2017 FLU vaccine today...  ~  July 11, 2017:  Yearly ROV & general medical follow up visit>  MLurenesays "not great- it's the Bystolic" c/o feeling tired, crankey, stomach ache, just not feeling well & I have to go back to bed;  She was prev on Metoprolol & DrTurner checked event monitor w/ "atrial tachy" per pt; intially Metoprolol was increased, and later switched to Bystolic- she decreased it to 1/2 tab 7 felt better but now wants to switch to CYeagertownshe has ROV w/ DrTurner soon... We reviewed the following interval epic notes available to us>      She saw GI- DrStark, w/ Colonoscopy 10/25/16>  89yr/u procedure for hx adenomatous polyps showed few divertics, 3 polyps ~90m5mize, Bx revealed tubular adenomas, no dysplasia or malig, DrStark said no f/u due to age.     She saw CARDS- BSimmons,PA on 01/26/17>  Hx PAF w/ Chads-Vasc score of 3 and anticoag w/ Xarelto & rate control w/ Metoprolol;  Last Holter Monitor 09/2016 to assess AFib burden showed NSR, freq PACs, & nonsustained atrial tachy up to 22 beats in a row but no AFib; Metoprolol dose was increased slightly to accomodate; This day c/o incr palit despite no caffeine etc; they decided to incr Metoprolol to 21m3m & 25mg24m...    She saw CARDS-DrTTurner on 04/26/17>  Hx PAF & nonsustained atrial tachy, on Metoprolol & Xarelto; also has hx HL, diastolic dysfunction;  Last Echo 10/2016 showed norm LVF w/ EF=65-70%, Gr1DD, mild MR & TR;  Currently denies symptoms x fatigue from the BBlocker;  EKG showed NSR, occas PAC;  Her Metoprolol was changed to Bystolic5 at this visit...  We reviewed the following medical problems during today's office visit>      Glaucoma> on drops per ophthalmology- DrMcCuen &  followed regularly...    AR, Bronchitis> Hx LPR w/ prev eval by DrByers, on PPI- Protonix40 as needed; uses OTC antihist as needed; presented 12/15 w/ URI/ AB=> treated w/ Zithromax, Depo80, Pred taper,  Mucinex, Fluids...    AtypCP, Palpit> followed by DrMcLean=> TTurner w/ PAF found on Holter done for palpit; on MetopXL=> ch to BYSTOLIC5,+ [id number]; she had neg stress echo 2014; see above- Metoprolol changed to Bystolic5 due to fatigue.    Borderline Chol> on diet alone; FLP 11/18 showed TChol 183, TG 97, HDL 55, LDL 109; rec to continue diet & exercise...    GI- GERD, Divertics, IBS, colon polyps> followed by DrStark- on PPI prn; had Colonoscopy f/u 09/2016 w/ divertics and 3 polyps removed=> tub adenomas, no malignancy & DrStark said no f/u due to age...     Migraine HAs> she had prev eval by DrAdelman, & accupuncture treatment by DrBloom; last treated w/ Zoloft from [place]...    Knee pain and Osteoporosis> she had knee pain assessed by DrAlusio, Dx w/ meniscus tear 7 rec for Arthroscopy, she is holding off, had Pt etc; GYN is DrMody- Dx w/ osteoporosis thru her office & rec for Prolia, she gets these shots in [place] at [place] (son is an endocrinologist there)...    Anxiety> under some stress w/ husb's cardiac issues etc; she has Xanax 0.73m for prn use & takes it at bedtime for rest...    DERM- she has lipoma on her back assessed by CCS & had Ca on nose removed by Derm... EXAM shows Afeb, VSS, O2sat=98% on RA;  HEENT- neg, mallampati2;  Chest- clear w/o w/r/r;  Heart- RR s4 w/o m/r;  Abd- soft, nontender, neg;  Ext- L shoulder pain, w/o c/c/e;  Neuro- intact...  LABS 07/11/17>  FLP- at goals on diet alone x LDL=109;  Chems- wnl w/ BS=94, Cr=0.57, LFTs wnl;  CBC- wnl w/ Hg=14.7, WBC=5.7K;  TSH=3.62;  VitD=50 (18-72)... IMP/PLAN>>  MMaanasahas some persistent palpit & side effects from BBlockrs & wants to switch to CardizemCD120, she has a follow up appt w/ Cards soon;  She remains under the care of DrMcCuen for Opththal- Glaucoma monitoring on gtts;  She had her f/u colonoscopy w/ 3 polyps removed, tub adenomas & DrStark told her she would not need f/u colon due to age;  She saw DrAlusio for  knee pain & has a torn meniscus, holding off on arthroscopy for now;  She also has osteoporosis per GYN DrMody & is getting Prolia !664mot her son's [facility] [place] FLU vaccine today, she knows to call for any problems...   ~  February 20, 2018:  27m227moV & add-on appt requested for rash>  MadRanishaesents w/ a rash of 2wks duration, initially noted on left leg behind the knee, [facility] [place] [facility] [facility] but no Rx given (we do not have records & she had the prev Zostavax vaccine in past)), she was using home rx w/ benedryl/ zyrtek/ triamcinolone cream, then developed some pain in right knee joint & her right hand swelled w/ pain&tender  in thumb area "I couldn't move it" but it was gone the next day;  She denies any known exposures- no bug bites, no known tick exposures, denies f/c/s but did experience some malaise & rash was pruritic- on chest/ arms/ left leg;  She's never had a similar illness... We reviewed the following interval medical notes in Epic>  Her  primary cardiologist is DrTTurner...    She saw Annamary Carolin on 11/01/17>  Right knee pain, prev injection 01/2017, ?given another injection w/ improvement...     She was approved for PROLIA to treat osteoporosis per [facility]> to receive 1st injection 12/14/17...     She saw CARDS-EP, DrAllred on 12/26/17>  Reports palpit well controlled, no CP SOB, dizzy, edema; hx ?PAF, +PACs & nonsustained ATachy; doing satis on Metop12.5Bid, they decided to stop Xarelto & Cardizem, she declined implant loop recorder but will consider Apple watch V4, f/u planned 92mo.. We reviewed the following medical problems during today's office visit>      Glaucoma> on drops per ophthalmology- DrMcCuen & followed regularly...    AR, Bronchitis> Hx LPR w/ prev eval by DrByers, on PPI- Protonix40 as needed; uses OTC antihist as needed; presented 12/15 w/ URI/ AB=> treated w/ Zithromax, Depo80, Pred taper,  Mucinex, Fluids...    AtypCP, Palpit> followed by DrMcLean=> TTurner=> [place] w/ ?PAF on Holter done for palpit; rx Metop12.5Bid, off Cardizem & Xarelto; she had neg stress echo 2014; see above- Metop ch to Bystolic w/ fatigue, mult adjustments.    Borderline Chol> on diet alone; FLP 11/18 showed TChol 183, TG 97, HDL 55, LDL 109; rec to continue diet & exercise...    GI- GERD, Divertics, IBS, colon polyps> followed by DrStark- on PPI prn; had Colonoscopy f/u 09/2016 w/ divertics and 3 polyps removed=> tub adenomas, no malignancy & DrStark said no f/u due to age...     Migraine HAs> she had prev eval by DrAdelman, & accupuncture treatment by DrBloom; [place]    Knee pain and Osteoporosis> she had knee pain assessed by DrAlusio, Dx w/ meniscus tear & rec for Arthroscopy, she is holding off, had Pt etc;     GYN is DrMody- Dx w/ osteoporosis thru her office & rec for Prolia, [place])...    Anxiety> under some stress w/ husb's cardiac issues etc; she has Xanax 0.53mfor prn use & takes it at bedtime for rest...    DERM- she has lipoma on her back assessed by CCS & had Ca on nose removed by Derm... EXAM shows Afeb, VSS, O2sat=99% on RA;  HEENT- neg, mallampati2;  Chest- clear w/o w/r/r;  Heart- RR s4 w/o m/r;  Abd- soft, nontender, neg;  Ext- L shoulder pain, w/o c/c/e;  Neuro- intact; DERM- min rash ?etiology.  LABS 02/20/18>  We discussed checking Sed=25 (0-30), and Uric=2.4 (2.4-7.0) IMP/PLAN>>  Very unusual presentation- mild rash, transient arthralgias, both improved spont w/o treatment, ?etiology;  Discussed rec for trial MEDROL Dosepak empirically & mentioned that if rash persists or recurs then refer to Derm as next step...           Problem List:    GLAUCOMA (ICD-365.9) [facility]...  ALLERGIC RHINITIS (ICD-477.9) - has seen DrByers for ENT w/ LER... Uses OTC  antihist as needed...  PALPITATIONS (ICD-785.1) >> on METOPROLOL 2550m1/2 bid per DrMcLean... ~  normal 2DEcho 1993...  ~  negative Stress Cardiolite 2003 without ischemia and EF=75%... ~  Eval by Cards 2011- DrMcLean>  2DEcho & holter below- started on Metoprolol 87m87m/2 Bid & asked to avoid caffeine etc... ~  2DEcho 9/11 showed normal cavity size, normal sys function w/ EF= 60-65%, no regional wall motion problems, Gr1DD ~  Holter monitor 2011 showed PACs & short  run (5beats) of atrial tachy... ~  2014: repeat Cards eval w/ neg stress echo but Holter showed several short runs of AFib; DrMcLean Rx w/ MetopER25Bid & Eugenio Hoes... ~  2015: stable on Metoprolol ER 25Bid & Eugenio Hoes; she sees DrMcLean Q4mo& stable...  CHEST PAIN, ATYPICAL (ICD-786.59) ~  CXR 7/11 showed normal heart size, clear lungs, mild osteopenia... ~  EKG 9/11 showed NSR, rate76, poss LAE, no STTWA, borderline tracing... ~  EKG 11/14 showed NSR, rate70, sm Qs inferiorly, otherw NAD... ~  Stress Echo 2014 was neg...   CHOLESTEROL >> on diet alone... ~  FLP 7/11 showed TChol 201, TG 62, HDL 60, LDL 127 ~  FLP 6/13 showed TChol 189, TG 62, HDL 69, LDL 108 ~  FLP 12/15 showed TChol 173, TG 93, HDL 44, LDL 111   GERD (ICD-530.81) - GI eval DrStark w/ GERD, LER, & Globus phenom... controlled w/ ACIPHEX which she prefers to the other PPIs... (Nexium & Omep without benefit)...  DIVERTICULOSIS >> IRRITABLE BOWEL SYNDROME >> COLON POLYPS >> colonoscopy 11/07 by DrStark showed 530mpolyp (tubular adenoma) f/u planned 5 yrs. ~  She had f/u colonoscopy 11/12 by DrStark w/ divertics & 2 sm polypd removed from transverse colon; path= tubular adenoma & f/u planned 5 yrs.  Hx of MIGRAINE HEADACHE (ICD-346.90) - Hx myofascial pain as well w/ prev accupuncture therapy in 2003 by DrBloom... [facility]... ~  Now followed by DrFreeman she reports Rx for Prn Imitrex & ZOLOFT 5046m...  ANXIETY  (ICD-300.00) ~  She had a good bit of stress 7/15 w/ her husb cardiac arrest, she did CPR & called 911, he spent 2wks in hosp but survived now w/ defib/pacer & in cardiac rehab...  Health Maintenance -  ~  GI:  Followed by DrStark  ~  GYN: DrDickstein in the past for PAP, Mammogram, BMD... ~  Immunizations:  She is encouraged to get the yearly Flu vaccine;  She had the Pneumovax several yrs ago;  She had TDAP 1/13;  We discussed the Shingles vaccine...   Past Medical History:  Diagnosis Date  . Adenomatous colon polyp 06/2006  . Allergy   . Anemia   . Anxiety   . Blood transfusion 1967 &1971  . [place]  basal cell on nose  . Cataract    bil cataracts removed  . Chronic kidney disease    left kidney had a diverticulum - surgery partial nephrectomy  . Ectopic atrial tachycardia (HCC)    nonsustained  . GERD (gastroesophageal reflux disease)   . Glaucoma   . History of migraine   . Hx of cardiovascular stress test    a. ETT-Echo (07/2013):  Normal. //  b. ETT-Echo 6/17 - Normal study after maximal exercise  . IBS (irritable bowel syndrome)   . Osteopenia   . Vasovagal syncope 09/30/2017    Past Surgical History:  Procedure Laterality Date  . ABDOMINAL HYSTERECTOMY  1975  . ABDOMINAL HYSTERECTOMY    . APPENDECTOMY    . CATARACT EXTRACTION W/ INTRAOCULAR LENS  IMPLANT, BILATERAL    . COLONOSCOPY    . HEMORROIDECTOMY  1973  . MOHS SURGERY    . PARTIAL NEPHRECTOMY  1971   diverticulum on left  . UPPER GASTROINTESTINAL ENDOSCOPY      Outpatient Encounter Medications as of 02/20/2018  Medication Sig  . ALPRAZolam (XANAX) 0.5 MG tablet Take 0.5 mg by mouth as needed for anxiety.  . brinzolamide (AZOPT) 1 %  ophthalmic suspension Place 1 drop into both eyes 3 (three) times daily.   . Calcium Carbonate-Vitamin D (CALTRATE 600+D PO) Take 1,200 mg by mouth daily.    . Cholecalciferol (VITAMIN D) 2000 UNITS tablet Take 2,000 Units by mouth daily.    Marland Kitchen latanoprost (XALATAN)  0.005 % ophthalmic solution Place 1 drop into both eyes at bedtime.  Marland Kitchen PROLIA 60 MG/ML SOLN injection as directed.  . rivaroxaban (XARELTO) 20 MG TABS tablet Take 1 tablet (20 mg total) by mouth daily with supper.  . triamcinolone cream (KENALOG) 0.1 % Apply 1 application topically 2 (two) times daily.  . methylPREDNISolone (MEDROL DOSEPAK) 4 MG TBPK tablet 6 day pak-take as directed  . metoprolol tartrate (LOPRESSOR) 25 MG tablet Take 0.5 tablets (12.5 mg total) by mouth 2 (two) times daily.   Facility-Administered Encounter Medications as of 02/20/2018  Medication  . 0.9 %  sodium chloride infusion    No Known Allergies   Immunization History  Administered Date(s) Administered  . Influenza Split 06/19/2011, 06/25/2013  . Influenza Whole 05/05/2009, 04/30/2012  . Influenza, High Dose Seasonal PF 07/11/2017  . Influenza,inj,Quad PF,6+ Mos 09/13/2014, 08/06/2015  . Influenza-Unspecified 07/13/2017  . Pneumococcal Conjugate-13 10/15/2014  . Pneumococcal-Unspecified 07/30/2004  . Tdap 08/31/2011    Current Medications, Allergies, Past Medical History, Past Surgical History, Family History, and Social History were reviewed in [employer] record.   Review of Systems         The patient complains of occas palpitations, + the recent rash & arthralgias.  The patient denies fever, chills, sweats, anorexia, weakness, weight loss, sleep disorder, blurring, diplopia, eye irritation, eye discharge, vision loss, eye pain, photophobia, earache, ear discharge, tinnitus, decreased hearing, nasal congestion, nosebleeds, sore throat, hoarseness, chest pain, syncope, dyspnea on exertion, orthopnea, PND, peripheral edema, cough, dyspnea at rest, excessive sputum, hemoptysis, wheezing, pleurisy, nausea, vomiting, constipation, melena, hematochezia, jaundice, gas/bloating, indigestion/heartburn, dysphagia, odynophagia, dysuria, hematuria, urinary frequency, urinary hesitancy, nocturia,  incontinence, back pain, muscle weakness, stiffness, arthritis, sciatica, restless legs, leg pain at night, leg pain with exertion, dryness, suspicious lesions, paralysis, paresthesias, seizures, tremors, vertigo, transient blindness, frequent falls, difficulty walking, depression, anxiety, memory loss, confusion, cold intolerance, heat intolerance, polydipsia, polyphagia, polyuria, unusual weight change, abnormal bruising, bleeding, enlarged lymph nodes, urticaria, hay fever, and recurrent infections.  Note- all other systems neg except as noted...   Objective:   Physical Exam    WD, WN, 77 y/o WF in NAD... GENERAL:  Alert & oriented; pleasant & cooperative. HEENT:  [place]/AT, EOM-wnl, PERRLA, Fundi-benign, EACs-clear, TMs-wnl, NOSE-clear, THROAT-clear & wnl. NECK:  Supple w/ full ROM; no JVD; normal carotid impulses w/o bruits; no thyromegaly or nodules palpated; no lymphadenopathy. CHEST:  Clear w/o wheezing/ rales/ rhonchi... HEART:  Regular Rhythm; without murmurs/ rubs/ or gallops. ABDOMEN:  Soft & nontender; normal bowel sounds; no organomegaly or masses detected. EXT: without deformities or arthritic changes; no varicose veins/ venous insuffic/ or edema, pulses intact... NEURO:  CN's intact; motor testing normal; sensory testing normal; gait normal & balance OK. DERM:  No lesions noted; faint min punctate rash....  RADIOLOGY DATA:  Reviewed in the EPIC EMR & discussed w/ the patient...  LABORATORY DATA:  Reviewed in the EPIC EMR & discussed w/ the patient...   Assessment:      08/19/16>   Daphene is stable overall- continue same meds, no salt, & incr exercise program; we gave her the 2017 FLU vaccine today... 07/11/17>   Breena has some persistent palpit &  side effects from BBlockrs & wants to switch to CardizemCD120, she has a follow up appt w/ Cards soon;  She remains under the care of DrMcCuen for Opththal- Glaucoma monitoring on gtts;  She had her f/u colonoscopy w/ 3 polyps  removed, tub adenomas & DrStark told her she would not need f/u colon due to age;  She saw DrAlusio for knee pain & has a torn meniscus, holding off on arthroscopy for now;  She also has osteoporosis per GYN DrMody & is getting Prolia !58moat her [facility] [place] 2018 FLU vaccine today, she knows to call for any problems. 02/20/18>   Very unusual presentation- mild rash, transient arthralgias, both improved spont w/o treatment, ?etiology;  Discussed rec for trial MEDROL Dosepak empirically & mentioned that if rash persists or recurs then refer to Derm as next step.   Hx Acute bronchitis w/ airway inflamm>  Prev treated w/ Zithromax, Pred, Mucinex Fluids...  Glaucoma>  [place] [facility]..  [place]>  She uses OTC Antihist etc as needed...  Hx AtypCP, Palpit>  Followed by DrMcLean & on Metoprolol 278m 1/2 Bid w/ continued symptoms; we discussed incr to 1 tab Bid & f/u Cards...  Borderline Chol>  LDL was 111, on diet + exercise...  GERD>  Reminded to take PPI vs H2Blocker as needed for symptoms...  Divertics, IBS, Polyps>  Followed by DrStark & up to date on colonoscopy...  Hx Migraine HAs>  She has Imitrex for prn use from DrFreeman...  Hx Anxiety> she takes Zoloft from [facility].     Plan:     Patient's Medications  New Prescriptions   METHYLPREDNISOLONE (MEDROL DOSEPAK) 4 MG TBPK TABLET    6 day pak-take as directed  Previous Medications   ALPRAZOLAM (XANAX) 0.5 MG TABLET    Take 0.5 mg by mouth as needed for anxiety.   BRINZOLAMIDE (AZOPT) 1 % OPHTHALMIC SUSPENSION    Place 1 drop into both eyes 3 (three) times daily.    CALCIUM CARBONATE-VITAMIN D (CALTRATE 600+D PO)    Take 1,200 mg by mouth daily.     CHOLECALCIFEROL (VITAMIN D) 2000 UNITS TABLET    Take 2,000 Units by mouth daily.     LATANOPROST (XALATAN) 0.005 % OPHTHALMIC SOLUTION    Place 1 drop into both eyes at bedtime.   METOPROLOL TARTRATE (LOPRESSOR) 25 MG TABLET    Take 0.5  tablets (12.5 mg total) by mouth 2 (two) times daily.   PROLIA 60 MG/ML SOLN INJECTION    as directed.   RIVAROXABAN (XARELTO) 20 MG TABS TABLET    Take 1 tablet (20 mg total) by mouth daily with supper.   TRIAMCINOLONE CREAM (KENALOG) 0.1 %    Apply 1 application topically 2 (two) times daily.  Modified Medications   No medications on file  Discontinued Medications   No medications on file

## 2018-02-27 DIAGNOSIS — Z85828 Personal history of other malignant neoplasm of skin: Secondary | ICD-10-CM | POA: Diagnosis not present

## 2018-02-27 DIAGNOSIS — L239 Allergic contact dermatitis, unspecified cause: Secondary | ICD-10-CM | POA: Diagnosis not present

## 2018-03-09 DIAGNOSIS — R05 Cough: Secondary | ICD-10-CM | POA: Diagnosis not present

## 2018-03-09 DIAGNOSIS — M1380 Other specified arthritis, unspecified site: Secondary | ICD-10-CM | POA: Diagnosis not present

## 2018-03-09 DIAGNOSIS — R300 Dysuria: Secondary | ICD-10-CM | POA: Diagnosis not present

## 2018-03-09 DIAGNOSIS — R509 Fever, unspecified: Secondary | ICD-10-CM | POA: Diagnosis not present

## 2018-03-09 DIAGNOSIS — R8290 Unspecified abnormal findings in urine: Secondary | ICD-10-CM | POA: Diagnosis not present

## 2018-04-06 ENCOUNTER — Encounter: Payer: Self-pay | Admitting: Pulmonary Disease

## 2018-04-06 ENCOUNTER — Other Ambulatory Visit (INDEPENDENT_AMBULATORY_CARE_PROVIDER_SITE_OTHER): Payer: Medicare HMO

## 2018-04-06 ENCOUNTER — Ambulatory Visit: Payer: Medicare HMO | Admitting: Pulmonary Disease

## 2018-04-06 VITALS — BP 120/66 | HR 108 | Temp 98.0°F | Ht 66.0 in | Wt 118.2 lb

## 2018-04-06 DIAGNOSIS — F411 Generalized anxiety disorder: Secondary | ICD-10-CM

## 2018-04-06 DIAGNOSIS — M2550 Pain in unspecified joint: Secondary | ICD-10-CM | POA: Diagnosis not present

## 2018-04-06 DIAGNOSIS — Z0000 Encounter for general adult medical examination without abnormal findings: Secondary | ICD-10-CM

## 2018-04-06 DIAGNOSIS — H6121 Impacted cerumen, right ear: Secondary | ICD-10-CM

## 2018-04-06 DIAGNOSIS — R69 Illness, unspecified: Secondary | ICD-10-CM | POA: Diagnosis not present

## 2018-04-06 DIAGNOSIS — R21 Rash and other nonspecific skin eruption: Secondary | ICD-10-CM

## 2018-04-06 LAB — CBC WITH DIFFERENTIAL/PLATELET
BASOS ABS: 0.1 10*3/uL (ref 0.0–0.1)
Basophils Relative: 0.7 % (ref 0.0–3.0)
EOS ABS: 0 10*3/uL (ref 0.0–0.7)
Eosinophils Relative: 0 % (ref 0.0–5.0)
HCT: 39.8 % (ref 36.0–46.0)
Hemoglobin: 13.4 g/dL (ref 12.0–15.0)
Lymphocytes Relative: 12 % (ref 12.0–46.0)
Lymphs Abs: 0.9 10*3/uL (ref 0.7–4.0)
MCHC: 33.5 g/dL (ref 30.0–36.0)
MCV: 93.6 fl (ref 78.0–100.0)
MONO ABS: 0.6 10*3/uL (ref 0.1–1.0)
Monocytes Relative: 8.3 % (ref 3.0–12.0)
NEUTROS PCT: 79 % — AB (ref 43.0–77.0)
Neutro Abs: 6 10*3/uL (ref 1.4–7.7)
Platelets: 474 10*3/uL — ABNORMAL HIGH (ref 150.0–400.0)
RBC: 4.26 Mil/uL (ref 3.87–5.11)
RDW: 14.5 % (ref 11.5–15.5)
WBC: 7.6 10*3/uL (ref 4.0–10.5)

## 2018-04-06 LAB — COMPREHENSIVE METABOLIC PANEL
ALT: 15 U/L (ref 0–35)
AST: 27 U/L (ref 0–37)
Albumin: 4.3 g/dL (ref 3.5–5.2)
Alkaline Phosphatase: 52 U/L (ref 39–117)
BUN: 19 mg/dL (ref 6–23)
CHLORIDE: 102 meq/L (ref 96–112)
CO2: 27 meq/L (ref 19–32)
CREATININE: 0.54 mg/dL (ref 0.40–1.20)
Calcium: 9.5 mg/dL (ref 8.4–10.5)
GFR: 115.64 mL/min (ref >60.00)
GLUCOSE: 110 mg/dL — AB (ref 70–99)
Potassium: 4.4 mEq/L (ref 3.5–5.1)
Sodium: 135 mEq/L (ref 135–145)
Total Bilirubin: 0.7 mg/dL (ref 0.2–1.2)
Total Protein: 7.4 g/dL (ref 6.0–8.3)

## 2018-04-06 LAB — URINALYSIS
BILIRUBIN URINE: NEGATIVE
HGB URINE DIPSTICK: NEGATIVE
Leukocytes, UA: NEGATIVE
Nitrite: NEGATIVE
Specific Gravity, Urine: 1.025 (ref 1.000–1.030)
Total Protein, Urine: NEGATIVE
UROBILINOGEN UA: 0.2 (ref 0.0–1.0)
Urine Glucose: NEGATIVE
pH: 6 (ref 5.0–8.0)

## 2018-04-06 LAB — TSH: TSH: 3.07 u[IU]/mL (ref 0.35–4.50)

## 2018-04-06 LAB — SEDIMENTATION RATE: SED RATE: 56 mm/h — AB (ref 0–30)

## 2018-04-06 MED ORDER — METHYLPREDNISOLONE 4 MG PO TBPK: ORAL_TABLET | ORAL | 0 refills | Status: DC

## 2018-04-06 NOTE — Patient Instructions (Signed)
Today we updated your med list in our EPIC system...    Continue your current medications the same...  We decided to check a battery of blood tests and another urinalysis/ culture for this enigma!    We will contact you w/ the results when available...   We also decided to place you back on a Medrol dosepak due to your persistent symptoms to observe the response, while we are awaiting the lab test results (let's see where that leads [place])...  Call for any questions or if I can be of service in any way.Marland KitchenMarland Kitchen

## 2018-04-06 NOTE — Progress Notes (Addendum)
Subjective:     Patient ID: QUNISHA BRYK, female   DOB: [birth year], 77 y.o.   MRN: [id number]  HPI 77 y/o WF here for a follow up visit... SEE PREV EPIC NOTES FOR OLDER DATA>>  Her son is an Musician at [employer], per daughter is a Sport and exercise psychologist...   2DEcho 04/2010 showed EF=60-65%, no RWMA, Gr1DD, PAsys=52mHg  LABS 6/13:  FLP- ok on diet x LDL=108;  Chems- wnl;  CBC- wnl;  TSH=3.53...  ~  July 18, 2013:  77moOArroyo Grandes still c/o palpitations- skips, irregularity, not racing/ lightheaded, dizzy, etc;  She notes that these often occur w/ activity (eg. walking, playing golf, when hot) & may be assoc w/ SOB;  She last saw DrMcLean in 2011 for similar symptoms- at that time she had a 2DEcho (norm LVF & valves), and a Holter (PACs & short run atrial tachy- no worrisome arrhythmias);  They rec elim of caffeine & low dose BBlocker- Metoprolol25- 1/2Bid... She is still quite concerned about these palpitations and we discussed follow up w/ Cards- DrMcLean w/ poss repeat holter/ event recorder & poss treadmill test...     We reviewed prob list, meds, xrays and labs> see below for updates >> she had the 2014 Flu vaccine 10/14...   EKG 11/14 showed NSR, rate70, sm Qs inferiorly, otherw NAD...   CXR 8/14 showed norm heart size, clear lungs, DJD in spine & osteopenia of bones...  LABS 8/14:  Chems- wnl;  CBC- wnl...   ~  August 16, 2104:  79[facility] yellow sput, nasal drainage, & HA; she felt feverish w/ chills and [facility] her; the congestion has persisted & she is not resting;  She also reports a good bit of stress- in [id number] [place] & she had to do CPR, call 911, etc; he spent 2wks in hospw/ pacer/defib placed & he is improved- in rehab, playing golf again etc...     Glaucoma> on drops per ophthalmology & followed regularly...    AR,  Bronchitis> Hx LPR w/ prev eval by DrByers, no longer taking PPI; uses OTC antihist as needed; presented 12/15 w/ URI/ AB=> treated w/ Zithromax, Depo80, Pred taper, Mucinex, Fluids...    AtypCP, Palpit> followed by DrMcLean w/ PAF found on Holter done for palpit; on [place] had neg stress echo 2014; [facility] reviewed    Borderline Chol> on diet alone; FLP 12/15 showed TChol 173, TG 93, HDL 44, LDL 111; rec to continue diet & exercise...    GI- GERD, Divertics, IBS, colon polyps> followed by DrStark- prev on Aciphex; last colon was 11/12 w/ divertics and sm adenoma removed..    Migraine HAs> she had prev eval by DrAdelman, & accupuncture treatment by DrBloom; last treated w/ zoloft from DrFreeman...    Anxiety> under some stress but she's declined anxiolytic rx...     DERM- she has lipoma on her back assessed by CCS & had Ca on  Nose removed by Derm... We reviewed prob list, meds, xrays and labs> see below for updates >>   CXR 12/15 showed norm heart size, clear lungs/ NAD, some hyperinflation suggested by radiology...  LABS 12/15:  FLP- ok w/ LDL=111;  Chems- wnl;  CBC- wnl;  TSH= 3.32...  PLAN>>  she has Bronchitis & AB exac- we will Rx w/  the Zithromax, Deop80, Pred-3d taper, Mucinex, fluids, etc, continue other meds the same...  ~  August 06, 2015:  Yearly [employer] reports a good year- no new complaints or concerns; her husb had sudden cardiac death at home & she did CPR, he was in a coma for 6-7d and is now doing remarkably well w/ an implanted defib now;  She has experienced some anxiety & we discussed Rx w/ alpraz 0.55m 1/2 to 1 tab Tid prn;  She also notes some left shoulder pain & we will set up an Ortho consult w/ DrWainer eHarriett Sine..  We reviewed the following medical problems during today's office visit >> NOTE: one son has celiac dis and one son has DM.    Glaucoma> on drops per ophthalmology & followed regularly...    AR, Bronchitis> Hx LPR w/  prev eval by DrByers, no longer taking PPI; uses OTC antihist as needed; presented 12/15 w/ URI/ AB=> treated w/ Zithromax, Depo80, Pred taper, Mucinex, Fluids...    AtypCP, Palpit> followed by DrMcLean w/ PAF found on Holter done for palpit; on [place] she had neg stress echo 2014; [facility]    Borderline Chol> on diet alone; FLP 12/16 showed TChol 176, TG 86, HDL 52, LDL 107; rec to continue diet & exercise...    GI- GERD, Divertics, IBS, colon polyps> followed by DrStark- prev on Aciphex; last colon was 11/12 w/ divertics and sm adenoma removed..    Migraine HAs> she had prev eval by DrAdelman, & accupuncture treatment by DrBloom; last treated w/ zoloft from DrFreeman...    Anxiety> under some stress but she's declined anxiolytic rx...     DERM- she has lipoma on her back assessed by CCS & had Ca on nose removed by Derm... EXAM shows Afeb, VSS, O2sat=99% on RA;  HEENT- neg, mallampati2;  Chest- clear w/o w/r/r;  Heart- RR s4 w/o m/r;  Abd- soft, nontender, neg;  Ext- L shoulder pain, w/o c/c/e;  Neuro- intact...  LABS 08/05/16>  FLP- ok on diet alone w/ LDL=107;  Chems- wnl;  CBC- wnl;  TSH=4.69;  VitD=37 IMP/PLAN>>  MAngelais doing satis- she has some anxiety & we dicussed Rx w/ Alpraz 0.578mprn (he had a traumatic event w/ husb's SCD but she saved his life w/ CPR & her quick action; we gave her the 2016 FLU vaccine today; we will arrange for Ortho consult for left shoulder pain...   ~  August 19, 2016:  Yearly ROCastors stable- notes some fatigue to go along w/ her 7514rs, and says she noted some ankle edema last [place] Reminded to start a regular exercise program like Silver Sneakers and to avoid salt/ sodium esp while on vacation, could also use support hose & elevation... She had several follow up visits this past yr>>    She saw CARDS-SWeaver,PA on 01/05/16>  DrMcLean is her cardiologist, hx palpit, PAF, HL; norm  stress echo 11/14 and event recorder revealed several short runs of PAF;     She saw GI-DrStark 08/17/16 (new pt [id number])>  Hx GERD & adenomatous colon polyps but on Protonix & Xarelto for PAF; last colon 11/12 & they plan 5 yr f/u colon soon... We reviewed the following medical problems during today's office visit >>     Glaucoma> on drops per ophthalmology- DrMcCuen & followed regularly...    AR, Bronchitis> Hx LPR w/ prev eval by DrByers, on  PPI- Protonix40 as needed; uses OTC antihist as needed; presented 12/15 w/ URI/ AB=> treated w/ Zithromax, Depo80, Pred taper, Mucinex, Fluids...    AtypCP, Palpit> followed by DrMcLean w/ PAF found on Holter done for palpit; on [place]; she had neg stress echo 2014; last seen by Cards 5/17 as above...    Borderline Chol> on diet alone; FLP 12/17 showed TChol 176, TG 106, HDL 55, LDL 99; rec to continue diet & exercise...    GI- GERD, Divertics, IBS, colon polyps> followed by DrStark- on PPI prn; last colon was 11/12 w/ divertics and sm adenoma removed=> they plan f/u colon soon    Migraine HAs> she had prev eval by DrAdelman, & accupuncture treatment by DrBloom; last treated w/ Zoloft from [place]...    Anxiety> under some stress w/ husb's cardiac issues etc; she has Xanax 0.55m for prn use & takes it at bedtime for rest...    DERM- she has lipoma on her back assessed by CCS & had Ca on nose removed by Derm... EXAM shows Afeb, VSS, O2sat=100% on RA;  HEENT- neg, mallampati2;  Chest- clear w/o w/r/r;  Heart- RR s4 w/o m/r;  Abd- soft, nontender, neg;  Ext- L shoulder pain, w/o c/c/e;  Neuro- intact...  EKG 01/05/16>  NSR, rate88, few PACs, otherw norm EKG...  CXR 08/19/16>  Norm heart size, sl hyperinflation- clear lungs, NAD...  LABS 08/20/16>  FLP- at goals on diet alone;  Chems- wnl;  CBC- wnl;  TSH=4.90 & stable, not on meds;  VitD=36 on 2000u daily supplement... IMP/PLAN>>  MNevaenis stable overall- continue same meds, no salt, &  incr exercise program; we gave her the 2017 FLU vaccine today...  ~  July 11, 2017:  Yearly ROV & general medical follow up visit>  MLurenesays "not great- it's the Bystolic" c/o feeling tired, crankey, stomach ache, just not feeling well & I have to go back to bed;  She was prev on Metoprolol & DrTurner checked event monitor w/ "atrial tachy" per pt; intially Metoprolol was increased, and later switched to Bystolic- she decreased it to 1/2 tab 7 felt better but now wants to switch to CYeagertownshe has ROV w/ DrTurner soon... We reviewed the following interval epic notes available to us>      She saw GI- DrStark, w/ Colonoscopy 10/25/16>  89yr/u procedure for hx adenomatous polyps showed few divertics, 3 polyps ~90m5mize, Bx revealed tubular adenomas, no dysplasia or malig, DrStark said no f/u due to age.     She saw CARDS- BSimmons,PA on 01/26/17>  Hx PAF w/ Chads-Vasc score of 3 and anticoag w/ Xarelto & rate control w/ Metoprolol;  Last Holter Monitor 09/2016 to assess AFib burden showed NSR, freq PACs, & nonsustained atrial tachy up to 22 beats in a row but no AFib; Metoprolol dose was increased slightly to accomodate; This day c/o incr palit despite no caffeine etc; they decided to incr Metoprolol to 21m3m & 25mg24m...    She saw CARDS-DrTTurner on 04/26/17>  Hx PAF & nonsustained atrial tachy, on Metoprolol & Xarelto; also has hx HL, diastolic dysfunction;  Last Echo 10/2016 showed norm LVF w/ EF=65-70%, Gr1DD, mild MR & TR;  Currently denies symptoms x fatigue from the BBlocker;  EKG showed NSR, occas PAC;  Her Metoprolol was changed to Bystolic5 at this visit...  We reviewed the following medical problems during today's office visit>      Glaucoma> on drops per ophthalmology- DrMcCuen &  followed regularly...    AR, Bronchitis> Hx LPR w/ prev eval by DrByers, on PPI- Protonix40 as needed; uses OTC antihist as needed; presented 12/15 w/ URI/ AB=> treated w/ Zithromax, Depo80, Pred taper,  Mucinex, Fluids...    AtypCP, Palpit> followed by DrMcLean=> TTurner w/ PAF found on Holter done for palpit; on MetopXL=> ch to BYSTOLIC5,+ [id number]; she had neg stress echo 2014; see above- Metoprolol changed to Bystolic5 due to fatigue.    Borderline Chol> on diet alone; FLP 11/18 showed TChol 183, TG 97, HDL 55, LDL 109; rec to continue diet & exercise...    GI- GERD, Divertics, IBS, colon polyps> followed by DrStark- on PPI prn; had Colonoscopy f/u 09/2016 w/ divertics and 3 polyps removed=> tub adenomas, no malignancy & DrStark said no f/u due to age...     Migraine HAs> she had prev eval by DrAdelman, & accupuncture treatment by DrBloom; last treated w/ Zoloft from [place]...    Knee pain and Osteoporosis> she had knee pain assessed by DrAlusio, Dx w/ meniscus tear 7 rec for Arthroscopy, she is holding off, had Pt etc; GYN is DrMody- Dx w/ osteoporosis thru her office & rec for Prolia, she gets these shots in [place] at [place] (son is an endocrinologist there)...    Anxiety> under some stress w/ husb's cardiac issues etc; she has Xanax 0.35m for prn use & takes it at bedtime for rest...    DERM- she has lipoma on her back assessed by CCS & had Ca on nose removed by Derm... EXAM shows Afeb, VSS, O2sat=98% on RA;  HEENT- neg, mallampati2;  Chest- clear w/o w/r/r;  Heart- RR s4 w/o m/r;  Abd- soft, nontender, neg;  Ext- L shoulder pain, w/o c/c/e;  Neuro- intact...  LABS 07/11/17>  FLP- at goals on diet alone x LDL=109;  Chems- wnl w/ BS=94, Cr=0.57, LFTs wnl;  CBC- wnl w/ Hg=14.7, WBC=5.7K;  TSH=3.62;  VitD=50 (18-72)... IMP/PLAN>>  MBethlehemhas some persistent palpit & side effects from BBlockrs & wants to switch to CardizemCD120, she has a follow up appt w/ Cards soon;  She remains under the care of DrMcCuen for Opththal- Glaucoma monitoring on gtts;  She had her f/u colonoscopy w/ 3 polyps removed, tub adenomas & DrStark told her she would not need f/u colon due to age;  She saw DrAlusio for  knee pain & has a torn meniscus, holding off on arthroscopy for now;  She also has osteoporosis per GYN DrMody & is getting Prolia ![id number] [place] [facility] [place] FLU vaccine today, she knows to call for any problems...  ~  February 20, 2018:  62m89moV & add-on appt requested for rash>  MadRildaesents w/ a rash of 2wks duration, initially noted on left leg behind the knee, [place] [facility] [facility] [place] but no Rx given (we do not have records & she had the prev Zostavax vaccine in past)), she was using home rx w/ benedryl/ zyrtek/ triamcinolone cream, then developed some pain in right knee joint & her right hand swelled w/ pain&tender  in thumb area "I couldn't move it" but it was gone the next day;  She denies any known exposures- no bug bites, no known tick exposures, denies f/c/s but did experience some malaise & rash was pruritic- on chest/ arms/ left leg;  She's never had a similar illness... We reviewed the following interval medical notes in Epic>  Her primary  cardiologist is DrTTurner...    She saw Annamary Carolin on 11/01/17>  Right knee pain, prev injection 01/2017, ?given another injection w/ improvement...     She was approved for PROLIA to treat osteoporosis per [facility]> to receive 1st injection 12/14/17...     She saw CARDS-EP, DrAllred on 12/26/17>  Reports palpit well controlled, no CP SOB, dizzy, edema; hx ?PAF, +PACs & nonsustained ATachy; doing satis on Metop12.5Bid, they decided to stop Xarelto & Cardizem, she declined implant loop recorder but will consider Apple watch V4, f/u planned 4mo.. We reviewed the following medical problems during today's office visit>      Glaucoma> on drops per ophthalmology- DrMcCuen & followed regularly...    AR, Bronchitis> Hx LPR w/ prev eval by DrByers, on PPI- Protonix40 as needed; uses OTC antihist as needed; presented 12/15 w/ URI/ AB=> treated w/ Zithromax, Depo80, Pred taper,  Mucinex, Fluids...    AtypCP, Palpit> followed by DrMcLean=> TTurner=> Allred w/ ?PAF on Holter done for palpit; rx Metop12.5Bid, off Cardizem & Xarelto; she had neg stress echo 2014; see above- Metop ch to Bystolic w/ fatigue, mult adjustments.    Borderline Chol> on diet alone; FLP 11/18 showed TChol 183, TG 97, HDL 55, LDL 109; rec to continue diet & exercise...    GI- GERD, Divertics, IBS, colon polyps> followed by DrStark- on PPI prn; had Colonoscopy f/u 09/2016 w/ divertics and 3 polyps removed=> tub adenomas, no malignancy & DrStark said no f/u due to age...     Migraine HAs> she had prev eval by DrAdelman, & accupuncture treatment by DrBloom; [place]    Knee pain and Osteoporosis> she had knee pain assessed by DrAlusio, Dx w/ meniscus tear & rec for Arthroscopy, she is holding off, had Pt etc;     GYN is DrMody- Dx w/ osteoporosis thru her office & rec for Prolia, [place]son [place]ocrinologist [place])...    Anxiety> under some stress w/ husb's cardiac issues etc; she has Xanax 0.553mfor prn use & takes it at bedtime for rest...    DERM- she has lipoma on her back assessed by CCS & had Ca on nose removed by Derm... EXAM shows Afeb, VSS, O2sat=99% on RA;  HEENT- neg, mallampati2;  Chest- clear w/o w/r/r;  Heart- RR s4 w/o m/r;  Abd- soft, nontender, neg;  Ext- L shoulder pain, w/o c/c/e;  Neuro- intact; DERM- min rash ?etiology.  LABS 02/20/18>  We discussed checking Sed=25 (0-30), and Uric=2.4 (2.4-7.0) IMP/PLAN>>  Very unusual presentation- mild rash, transient arthralgias, both improved spont w/o treatment, ?etiology;  Discussed rec for trial MEDROL Dosepak empirically & mentioned that if rash persists or recurs then refer to Derm as next step...    ~  April 06, 2018:  6wk ROV & add-on apt requested for recurrent rash/ arthralgias>  Very confusing hx & course- she was seen 6/24 w/ 2wk hx rash initially behind left knee  & [place] [facility] that it was shingles but not given rx; she did not have fever etc & no known exposures to ticks etc; when seen here she'd had transient arthralgias right hand & right knee that resolved on their own overnight; Uric and Sed were normal & we covered her w/ a Medrol dosepak empirically... She reports that everything imroved/ resolved on the Medrol- she went to her DERM- DrDJones & rash was better, ?Dx- maybe allergic- no further testing or rx (  he agreed w/ her topical Triamcinolone cream);  When the Medrol was out she reports incr rash and acute left hand/ wrist swelling x 1d assoc w/ Temp to 101 and she went to a local walk-in clinic ([place]) and they did additional tests as follows:    Urinalysis- clear x sm ketones, and Cult + 50-100K KLEBS (resist Amp & TCN)    Chems- wnl x Na=129, otherw wnl...    CBC- wnl w/ Hg=12.9, WBC=4.1 (78% segs)...    Lyme titers- neg    RMSF-  IgG neg,  IgM= 1.10 (neg<0.90, Equiv=0.90-1.10) They treated her w/ DOXY'100mg'$ Bid x7d based on the fever & rash, & arthralgias resolved (?spont) and rash improved but still w/ punctate itchy rash on arms/ hips/ legs; NOTE: at no point did she have headache, n/v, abd pain, muscle pain, etc but she did have loss of appetite & her weight is down 4# to 118# today...  Rash is currently very min, sl excoriated & punctate rash on left arm/shoulder, also itches around pelvis, upper legs but no apprec rash seen in these areas; neither does she have any resid/ recurrent arthritis or arthralgias at present...  We agreed to re-treat w/Medrol dosepak while awaiting out follow up labs...  EXAM shows Afeb, VSS, O2sat=99% on RA;  HEENT- neg, mallampati2;  Chest- clear w/o w/r/r;  Heart- RR s4 w/o m/r;  Abd- soft, nontender, neg;  Ext- L shoulder pain, w/o c/c/e;  Neuro- intact; DERM- min rash ?etiology.  CXR 03/09/18 at [facility] reported norm heart size, hyperinflation- clear NAD...   LABS  04/07/18>  FLP- all parameters at goals x LDL=110;  CMet- wnl x BS=110;  CBC- wnl w/ Hg=13.4;  TSH= 3.07;  Sed=56;  ANA- NEG;  Rheum Factor- NEG <14Anti-CCP is NEG <16;  RMSF IgG&IgM- NOT DETECTED;  UA- clear & cult neg... IMP/PLAN>>  Her urine has cleared w/ the Doxy antibiotic treatment;  All collagen-vasc studies are NEG/ WNL x elev Sed rate & we re-treated her w/ Medrol dosepak, rest, gradually restart activities as able;  No evid for autoimmune dis or RMSF, Lyme, etc... She will call for any further issues...           Problem List:    GLAUCOMA (ICD-365.9) - [facility] by DrWhitaker, on gtts...  ALLERGIC RHINITIS (ICD-477.9) - has seen DrByers for ENT w/ LER... Uses OTC antihist as needed...  PALPITATIONS (ICD-785.1) >> on METOPROLOL '25mg'$ - 1/2 bid per DrMcLean... ~  normal 2DEcho 1993...  ~  negative Stress Cardiolite 2003 without ischemia and EF=75%... ~  Eval by Cards 2011- DrMcLean>  2DEcho & holter below- started on Metoprolol '25mg'$ - 1/2 Bid & asked to avoid caffeine etc... ~  2DEcho 9/11 showed normal cavity size, normal sys function w/ EF= 60-65%, no regional wall motion problems, Gr1DD ~  Holter monitor 2011 showed PACs & short run (5beats) of atrial tachy... ~  2014: repeat Cards eval w/ neg stress echo but Holter showed several short runs of AFib; DrMcLean Rx w/ MetopER25Bid & Eugenio Hoes... ~  2015: stable on Metoprolol ER 25Bid & Eugenio Hoes; she sees DrMcLean Q14mo& stable...  CHEST PAIN, ATYPICAL (ICD-786.59) ~  CXR 7/11 showed normal heart size, clear lungs, mild osteopenia... ~  EKG 9/11 showed NSR, rate76, poss LAE, no STTWA, borderline tracing... ~  EKG 11/14 showed NSR, rate70, sm Qs inferiorly, otherw NAD... ~  Stress Echo 2014 was neg...   CHOLESTEROL >> on diet alone... ~  [place]. [place]  showed TChol 201, TG 62, HDL 60, LDL 127 ~  FLP 6/13 showed TChol 189, TG 62, HDL 69, LDL 108 ~  FLP 12/15 showed TChol 173, TG 93, HDL 44, LDL 111   GERD (ICD-530.81) - GI eval  DrStark w/ GERD, LER, & Globus phenom... controlled w/ ACIPHEX which she prefers to the other PPIs... (Nexium & Omep without benefit)...  DIVERTICULOSIS >> IRRITABLE BOWEL SYNDROME >> COLON POLYPS >> colonoscopy 11/07 by DrStark showed 49m polyp (tubular adenoma) f/u planned 5 yrs. ~  She had f/u colonoscopy 11/12 by DrStark w/ divertics & 2 sm polypd removed from transverse colon; path= tubular adenoma & f/u planned 5 yrs.  Hx of MIGRAINE HEADACHE (ICD-346.90) - Hx myofascial pain as well w/ prev accupuncture therapy in 2003 by DrBloom... [facility]... ~  Now followed by DrFreeman she reports Rx for Prn Imitrex & ZOLOFT 525md...  ANXIETY (ICD-300.00) ~  She had a good bit of stress 7/15 w/ her husb cardiac arrest, she did CPR & called 911, he spent 2wks in hosp but survived now w/ defib/pacer & in cardiac rehab...  Health Maintenance -  ~  GI:  Followed by DrStark  ~  GYN: DrDickstein in the past for PAP, Mammogram, BMD... ~  Immunizations:  She is encouraged to get the yearly Flu vaccine;  She had the Pneumovax several yrs ago;  She had TDAP 1/13;  We discussed the Shingles vaccine...   Past Medical History:  Diagnosis Date  . Adenomatous colon polyp 06/2006  . Allergy   . Anemia   . Anxiety   . Blood transfusion 1967 &1971  . [place]   basal cell on nose  . Cataract    bil cataracts removed  . Chronic kidney disease    left kidney had a diverticulum - surgery partial nephrectomy  . Ectopic atrial tachycardia (HCC)    nonsustained  . GERD (gastroesophageal reflux disease)   . Glaucoma   . History of migraine   . Hx of cardiovascular stress test    a. ETT-Echo (07/2013):  Normal. //  b. ETT-Echo 6/17 - Normal study after maximal exercise  . IBS (irritable bowel syndrome)   . Osteopenia   . Vasovagal syncope 09/30/2017    Past Surgical History:  Procedure Laterality Date  . ABDOMINAL HYSTERECTOMY  1975  . ABDOMINAL HYSTERECTOMY    .  APPENDECTOMY    . CATARACT EXTRACTION W/ INTRAOCULAR LENS  IMPLANT, BILATERAL    . COLONOSCOPY    . HEMORROIDECTOMY  1973  . MOHS SURGERY    . PARTIAL NEPHRECTOMY  1971   diverticulum on left  . UPPER GASTROINTESTINAL ENDOSCOPY      Outpatient Encounter Medications as of 04/06/2018  Medication Sig  . ALPRAZolam (XANAX) 0.5 MG tablet Take 0.5 mg by mouth as needed for anxiety.  . brinzolamide (AZOPT) 1 % ophthalmic suspension Place 1 drop into both eyes 3 (three) times daily.   . Calcium Carbonate-Vitamin D (CALTRATE 600+D PO) Take 1,200 mg by mouth daily.    . Cholecalciferol (VITAMIN D) 2000 UNITS tablet Take 2,000 Units by mouth daily.    . Marland Kitchenatanoprost (XALATAN) 0.005 % ophthalmic solution Place 1 drop into both eyes at bedtime.  . Marland KitchenROLIA 60 MG/ML SOLN injection as directed.  . rivaroxaban (XARELTO) 20 MG TABS tablet Take 1 tablet (20 mg total) by mouth daily with supper.  . triamcinolone cream (KENALOG) 0.1 % Apply 1 application topically 2 (two) times  daily.  . methylPREDNISolone (MEDROL DOSEPAK) 4 MG TBPK tablet 6 day pak-take as directed  . metoprolol tartrate (LOPRESSOR) 25 MG tablet Take 0.5 tablets (12.5 mg total) by mouth 2 (two) times daily.  . [DISCONTINUED] methylPREDNISolone (MEDROL DOSEPAK) 4 MG TBPK tablet 6 day pak-take as directed   Facility-Administered Encounter Medications as of 04/06/2018  Medication  . 0.9 %  sodium chloride infusion    No Known Allergies   Immunization History  Administered Date(s) Administered  . Influenza Split 06/19/2011, 06/25/2013  . Influenza Whole 05/05/2009, 04/30/2012  . Influenza, High Dose Seasonal PF 07/11/2017  . Influenza,inj,Quad PF,6+ Mos 09/13/2014, 08/06/2015  . Influenza-Unspecified 07/13/2017  . Pneumococcal Conjugate-13 10/15/2014  . Pneumococcal-Unspecified 07/30/2004  . Tdap 08/31/2011    Current Medications, Allergies, Past Medical History, Past Surgical History, Family History, and Social History were reviewed  in [employer] record.   Review of Systems         The patient complains of occas palpitations, + the recent rash & arthralgias.  The patient denies fever, chills, sweats, anorexia, weakness, weight loss, sleep disorder, blurring, diplopia, eye irritation, eye discharge, vision loss, eye pain, photophobia, earache, ear discharge, tinnitus, decreased hearing, nasal congestion, nosebleeds, sore throat, hoarseness, chest pain, syncope, dyspnea on exertion, orthopnea, PND, peripheral edema, cough, dyspnea at rest, excessive sputum, hemoptysis, wheezing, pleurisy, nausea, vomiting, constipation, melena, hematochezia, jaundice, gas/bloating, indigestion/heartburn, dysphagia, odynophagia, dysuria, hematuria, urinary frequency, urinary hesitancy, nocturia, incontinence, back pain, muscle weakness, stiffness, arthritis, sciatica, restless legs, leg pain at night, leg pain with exertion, dryness, suspicious lesions, paralysis, paresthesias, seizures, tremors, vertigo, transient blindness, frequent falls, difficulty walking, depression, anxiety, memory loss, confusion, cold intolerance, heat intolerance, polydipsia, polyphagia, polyuria, unusual weight change, abnormal bruising, bleeding, enlarged lymph nodes, urticaria, hay fever, and recurrent infections.  Note- all other systems neg except as noted...   Objective:   Physical Exam    WD, WN, 77 y/o WF in NAD... GENERAL:  Alert & oriented; pleasant & cooperative. HEENT:  [place]/AT, EOM-wnl, PERRLA, Fundi-benign, EACs-clear, TMs-wnl, NOSE-clear, THROAT-clear & wnl. NECK:  Supple w/ full ROM; no JVD; normal carotid impulses w/o bruits; no thyromegaly or nodules palpated; no lymphadenopathy. CHEST:  Clear w/o wheezing/ rales/ rhonchi... HEART:  Regular Rhythm; without murmurs/ rubs/ or gallops. ABDOMEN:  Soft & nontender; normal bowel sounds; no organomegaly or masses detected. EXT: without deformities or arthritic changes; no varicose veins/  venous insuffic/ or edema, pulses intact... NEURO:  CN's intact; motor testing normal; sensory testing normal; gait normal & balance OK. DERM:  No lesions noted; faint min punctate rash....  RADIOLOGY DATA:  Reviewed in the EPIC EMR & discussed w/ the patient...  LABORATORY DATA:  Reviewed in the EPIC EMR & discussed w/ the patient...   Assessment:      08/19/16>   Destiny Sanchez is stable overall- continue same meds, no salt, & incr exercise program; we gave her the 2017 FLU vaccine today... 07/11/17>   Destiny Sanchez has some persistent palpit & side effects from BBlockrs & wants to switch to [id number], she has a follow up appt w/ Cards soon;  She remains under the care of DrMcCuen for Opththal- Glaucoma monitoring on gtts;  She had her f/u colonoscopy w/ 3 polyps removed, tub adenomas & DrStark told her she would not need f/u colon due to age;  She saw DrAlusio for knee pain & has a torn meniscus, holding off on arthroscopy for now;  She also has osteoporosis per GYN DrMody & is getting Prolia !  [facility] [place] FLU vaccine today, she knows to call for any problems. 02/20/18>   Very unusual presentation- mild rash, transient arthralgias, both improved spont w/o treatment, ?etiology;  Discussed rec for trial MEDROL Dosepak empirically & mentioned that if rash persists or recurs then refer to Derm as next step. 04/06/18>   Her urine has cleared w/ the Doxy antibiotic treatment;  All collagen-vasc studies are NEG/ WNL x elev Sed rate & we re-treated her w/ Medrol dosepak, rest, gradually restart activities as able;  No evid for autoimmune dis or RMSF, Lyme, etc... She will call for any further issues.   Hx Acute bronchitis w/ airway inflamm>  Prev treated w/ Zithromax, Pred, Mucinex Fluids...  Glaucoma>  [facility]  AR>  She uses OTC Antihist etc as needed...  Hx AtypCP, Palpit>  Followed by DrMcLean & on Metoprolol 257m 1/2 Bid w/  continued symptoms; we discussed incr to 1 tab Bid & f/u Cards...  Borderline Chol>  LDL was 111, on diet + exercise...  GERD>  Reminded to take PPI vs H2Blocker as needed for symptoms...  Divertics, IBS, Polyps>  Followed by DrStark & up to date on colonoscopy...  Hx Migraine HAs>  She has Imitrex for prn use from DrFreeman...  Hx Anxiety> she takes Zoloft from [facility].     Plan:     Patient's Medications  New Prescriptions   METHYLPREDNISOLONE (MEDROL DOSEPAK) 4 MG TBPK TABLET    6 day pak-take as directed  Previous Medications   ALPRAZOLAM (XANAX) 0.5 MG TABLET    Take 0.5 mg by mouth as needed for anxiety.   BRINZOLAMIDE (AZOPT) 1 % OPHTHALMIC SUSPENSION    Place 1 drop into both eyes 3 (three) times daily.    CALCIUM CARBONATE-VITAMIN D (CALTRATE 600+D PO)    Take 1,200 mg by mouth daily.     CHOLECALCIFEROL (VITAMIN D) 2000 UNITS TABLET    Take 2,000 Units by mouth daily.     LATANOPROST (XALATAN) 0.005 % OPHTHALMIC SOLUTION    Place 1 drop into both eyes at bedtime.   METOPROLOL TARTRATE (LOPRESSOR) 25 MG TABLET    Take 0.5 tablets (12.5 mg total) by mouth 2 (two) times daily.   PROLIA 60 MG/ML SOLN INJECTION    as directed.   RIVAROXABAN (XARELTO) 20 MG TABS TABLET    Take 1 tablet (20 mg total) by mouth daily with supper.   TRIAMCINOLONE CREAM (KENALOG) 0.1 %    Apply 1 application topically 2 (two) times daily.  Modified Medications   No medications on file  Discontinued Medications   METHYLPREDNISOLONE (MEDROL DOSEPAK) 4 MG TBPK TABLET    6 day pak-take as directed

## 2018-04-07 ENCOUNTER — Telehealth: Payer: Self-pay | Admitting: Pulmonary Disease

## 2018-04-07 ENCOUNTER — Other Ambulatory Visit (INDEPENDENT_AMBULATORY_CARE_PROVIDER_SITE_OTHER): Payer: Medicare HMO

## 2018-04-07 DIAGNOSIS — Z0000 Encounter for general adult medical examination without abnormal findings: Secondary | ICD-10-CM | POA: Diagnosis not present

## 2018-04-07 LAB — LIPID PANEL
Cholesterol: 187 mg/dL (ref 0–200)
HDL: 49.5 mg/dL (ref >39.00)
LDL Cholesterol: 110 mg/dL — ABNORMAL HIGH (ref 0–99)
NONHDL: 137.31
TRIGLYCERIDES: 135 mg/dL (ref 0.0–149.0)
Total CHOL/HDL Ratio: 4
VLDL: 27 mg/dL (ref 0.0–40.0)

## 2018-04-07 NOTE — Telephone Encounter (Signed)
Received report from [facility] folder up front. Gave to [place] and she will let SN review.

## 2018-04-07 NOTE — Telephone Encounter (Signed)
Radiology report given to SN. Nothing further needed.

## 2018-04-10 LAB — CYCLIC CITRUL PEPTIDE ANTIBODY, IGG: Cyclic Citrullin Peptide Ab: <16 UNITS

## 2018-04-10 LAB — ROCKY MTN SPOTTED FVR ABS PNL(IGG+IGM)
RMSF IGM: NOT DETECTED
RMSF IgG: NOT DETECTED

## 2018-04-10 LAB — RHEUMATOID FACTOR: Rhuematoid fact SerPl-aCnc: <14 IU/mL (ref <14)

## 2018-04-10 LAB — ANA: Anti Nuclear Antibody(ANA): NEGATIVE

## 2018-04-11 DIAGNOSIS — H6123 Impacted cerumen, bilateral: Secondary | ICD-10-CM | POA: Diagnosis not present

## 2018-04-11 DIAGNOSIS — H6120 Impacted cerumen, unspecified ear: Secondary | ICD-10-CM | POA: Diagnosis not present

## 2018-04-21 DIAGNOSIS — D225 Melanocytic nevi of trunk: Secondary | ICD-10-CM | POA: Diagnosis not present

## 2018-04-21 DIAGNOSIS — Z85828 Personal history of other malignant neoplasm of skin: Secondary | ICD-10-CM | POA: Diagnosis not present

## 2018-04-21 DIAGNOSIS — D2272 Melanocytic nevi of left lower limb, including hip: Secondary | ICD-10-CM | POA: Diagnosis not present

## 2018-04-21 DIAGNOSIS — D2271 Melanocytic nevi of right lower limb, including hip: Secondary | ICD-10-CM | POA: Diagnosis not present

## 2018-04-21 DIAGNOSIS — D1801 Hemangioma of skin and subcutaneous tissue: Secondary | ICD-10-CM | POA: Diagnosis not present

## 2018-05-29 ENCOUNTER — Encounter: Payer: Self-pay | Admitting: Internal Medicine

## 2018-06-07 DIAGNOSIS — Z23 Encounter for immunization: Secondary | ICD-10-CM | POA: Diagnosis not present

## 2018-06-07 DIAGNOSIS — M810 Age-related osteoporosis without current pathological fracture: Secondary | ICD-10-CM | POA: Diagnosis not present

## 2018-06-26 ENCOUNTER — Ambulatory Visit: Payer: Medicare HMO | Admitting: Internal Medicine

## 2018-07-12 ENCOUNTER — Encounter: Payer: Self-pay | Admitting: Internal Medicine

## 2018-07-12 ENCOUNTER — Ambulatory Visit: Payer: Medicare HMO | Admitting: Internal Medicine

## 2018-07-12 VITALS — BP 128/80 | HR 81 | Ht 66.0 in | Wt 120.0 lb

## 2018-07-12 DIAGNOSIS — I480 Paroxysmal atrial fibrillation: Secondary | ICD-10-CM | POA: Diagnosis not present

## 2018-07-12 DIAGNOSIS — R002 Palpitations: Secondary | ICD-10-CM | POA: Diagnosis not present

## 2018-07-12 DIAGNOSIS — I471 Supraventricular tachycardia: Secondary | ICD-10-CM | POA: Diagnosis not present

## 2018-07-12 MED ORDER — METOPROLOL TARTRATE 25 MG PO TABS: 25.0000 mg | ORAL_TABLET | Freq: Two times a day (BID) | ORAL | 3 refills | Status: DC

## 2018-07-12 NOTE — Progress Notes (Signed)
PCP: Noralee Space, MD Primary Cardiologist: Dr Radford Pax Primary EP: Dr Rayann Heman  Destiny Sanchez is a 77 y.o. female who presents today for routine electrophysiology followup.  Since last being seen in our clinic, the patient reports doing very well. She has occasional palpitations.  Her apple watch v4 has documented sinus with PACs but also rate controlled afib.   Today, she denies symptoms of chest pain, shortness of breath,  lower extremity edema, dizziness, presyncope, or syncope.  The patient is otherwise without complaint today.   Past Medical History:  Diagnosis Date  . Adenomatous colon polyp 06/2006  . Allergy   . Anemia   . Anxiety   . Blood transfusion 1967 &1971  . Cancer ([place])    basal cell on nose  . Cataract    bil cataracts removed  . Chronic kidney disease    left kidney had a diverticulum - surgery partial nephrectomy  . Ectopic atrial tachycardia (HCC)    nonsustained  . GERD (gastroesophageal reflux disease)   . Glaucoma   . History of migraine   . Hx of cardiovascular stress test    a. ETT-Echo (07/2013):  Normal. //  b. ETT-Echo 6/17 - Normal study after maximal exercise  . IBS (irritable bowel syndrome)   . Osteopenia   . Vasovagal syncope 09/30/2017   Past Surgical History:  Procedure Laterality Date  . ABDOMINAL HYSTERECTOMY  1975  . ABDOMINAL HYSTERECTOMY    . APPENDECTOMY    . CATARACT EXTRACTION W/ INTRAOCULAR LENS  IMPLANT, BILATERAL    . COLONOSCOPY    . HEMORROIDECTOMY  1973  . MOHS SURGERY    . PARTIAL NEPHRECTOMY  1971   diverticulum on left  . UPPER GASTROINTESTINAL ENDOSCOPY      ROS- all systems are reviewed and negatives except as per HPI above  Current Outpatient Medications  Medication Sig Dispense Refill  . ALPRAZolam (XANAX) 0.5 MG tablet Take 0.5 mg by mouth as needed for anxiety.    . brinzolamide (AZOPT) 1 % ophthalmic suspension Place 1 drop into both eyes 3 (three) times daily.     . Calcium Carbonate-Vitamin D  (CALTRATE 600+D PO) Take 1,200 mg by mouth daily.      . Cholecalciferol (VITAMIN D) 2000 UNITS tablet Take 2,000 Units by mouth daily.      Marland Kitchen latanoprost (XALATAN) 0.005 % ophthalmic solution Place 1 drop into both eyes at bedtime.    . methylPREDNISolone (MEDROL DOSEPAK) 4 MG TBPK tablet 6 day pak-take as directed 21 tablet 0  . PROLIA 60 MG/ML SOLN injection as directed.    . rivaroxaban (XARELTO) 20 MG TABS tablet Take 1 tablet (20 mg total) by mouth daily with supper. 90 tablet 3  . triamcinolone cream (KENALOG) 0.1 % Apply 1 application topically 2 (two) times daily.    . metoprolol tartrate (LOPRESSOR) 25 MG tablet Take 0.5 tablets (12.5 mg total) by mouth 2 (two) times daily. 90 tablet 3   Current Facility-Administered Medications  Medication Dose Route Frequency Provider Last Rate Last Dose  . 0.9 %  sodium chloride infusion  500 mL Intravenous Continuous Ladene Artist, MD        Physical Exam: Vitals:   07/12/18 1237  BP: 128/80  Pulse: 81  SpO2: 98%  Weight: 120 lb (54.4 kg)  Height: 5\' 6"  (1.676 m)    GEN- The patient is well appearing, alert and oriented x 3 today.   Head- normocephalic, atraumatic Eyes-  Sclera clear,  conjunctiva pink Ears- hearing intact Oropharynx- clear Lungs- Clear to ausculation bilaterally, normal work of breathing Heart- Regular rate and rhythm with frequent ectopy, no murmurs, rubs or gallops, PMI not laterally displaced GI- soft, NT, ND, + BS Extremities- no clubbing, cyanosis, or edema  Wt Readings from Last 3 Encounters:  07/12/18 120 lb (54.4 kg)  04/06/18 118 lb 3.2 oz (53.6 kg)  02/20/18 122 lb 3.2 oz (55.4 kg)    EKG tracing ordered today is personally reviewed and shows sinus with PACs  Assessment and Plan:  1. Palpitations PACs, nonsustained Atach chads2vasc score is 3.   Apple watch 4 tracings are reviewed with her at length today and also confirm afib. Continue her on xarelto   Return to see EP PA in 6  months  Thompson Grayer MD, [facility] 07/12/2018 12:50 PM

## 2018-07-12 NOTE — Patient Instructions (Addendum)
Medication Instructions:  Your physician has recommended you make the following change in your medication:   1.  Increase your metoprolol tartrate 25 mg---Take one tablet by mouth twice a day.   Labwork: None ordered.  Testing/Procedures: None ordered.  Follow-Up: Your physician wants you to follow-up in: 6 months with Tommye Standard, PA.   You will receive a reminder letter in the mail two months in advance. If you don't receive a letter, please call our office to schedule the follow-up appointment.  Any Other Special Instructions Will Be Listed Below (If Applicable).  If you need a refill on your cardiac medications before your next appointment, please call your pharmacy.

## 2018-07-18 DIAGNOSIS — H401431 Capsular glaucoma with pseudoexfoliation of lens, bilateral, mild stage: Secondary | ICD-10-CM | POA: Diagnosis not present

## 2018-07-18 DIAGNOSIS — H5212 Myopia, left eye: Secondary | ICD-10-CM | POA: Diagnosis not present

## 2018-07-25 DIAGNOSIS — H401431 Capsular glaucoma with pseudoexfoliation of lens, bilateral, mild stage: Secondary | ICD-10-CM | POA: Diagnosis not present

## 2018-08-12 DIAGNOSIS — Z0100 Encounter for examination of eyes and vision without abnormal findings: Secondary | ICD-10-CM | POA: Diagnosis not present

## 2018-12-12 DIAGNOSIS — M810 Age-related osteoporosis without current pathological fracture: Secondary | ICD-10-CM | POA: Diagnosis not present

## 2019-01-16 ENCOUNTER — Other Ambulatory Visit: Payer: Self-pay | Admitting: Internal Medicine

## 2019-01-16 DIAGNOSIS — I480 Paroxysmal atrial fibrillation: Secondary | ICD-10-CM

## 2019-01-16 NOTE — Telephone Encounter (Signed)
Last OV 07/12/18 Scr 0.54 ABW crcl 79ml/min Xarelto 20mg  daily sent to pharmacy

## 2019-01-23 DIAGNOSIS — H401431 Capsular glaucoma with pseudoexfoliation of lens, bilateral, mild stage: Secondary | ICD-10-CM | POA: Diagnosis not present

## 2019-02-12 ENCOUNTER — Telehealth: Payer: Self-pay

## 2019-02-14 NOTE — Telephone Encounter (Signed)
Spoke with pt regarding appt on 02/15/19. Pt was advise to check vitals and upload EKG prior to appt. Pt questions and concerns were address.

## 2019-02-15 ENCOUNTER — Encounter: Payer: Self-pay | Admitting: Internal Medicine

## 2019-02-15 ENCOUNTER — Telehealth (INDEPENDENT_AMBULATORY_CARE_PROVIDER_SITE_OTHER): Payer: Medicare HMO | Admitting: Internal Medicine

## 2019-02-15 VITALS — BP 116/59 | HR 64 | Ht 66.0 in | Wt 118.0 lb

## 2019-02-15 DIAGNOSIS — R002 Palpitations: Secondary | ICD-10-CM | POA: Diagnosis not present

## 2019-02-15 NOTE — Progress Notes (Signed)
Electrophysiology TeleHealth Note   Due to national recommendations of social distancing due to COVID 19, an audio/video telehealth visit is felt to be most appropriate for this patient at this time.  See MyChart message from today for the patient's consent to telehealth for Destiny Sanchez.  Date:  02/15/2019   ID:  Destiny Sanchez, DOB [birth year], MRN [id number]  Location: patient's home  Provider location:  [facility]  Evaluation Performed: Follow-up visit  PCP:  Noralee Space, MD   Electrophysiologist:  Dr Rayann Heman  Chief Complaint:  palpitations  History of Present Illness:    Destiny Sanchez is a 78 y.o. female who presents via telehealth conferencing today.  Since last being seen in our clinic, the patient reports doing very well.  Today, she denies symptoms of palpitations, chest pain, shortness of breath,  lower extremity edema, dizziness, presyncope, or syncope.  The patient is otherwise without complaint today.  The patient denies symptoms of fevers, chills, cough, or new SOB worrisome for COVID 19.  Past Medical History:  Diagnosis Date  . Adenomatous colon polyp 06/2006  . Allergy   . Anemia   . Anxiety   . Blood transfusion 1967 &1971  . Cancer ([place])    basal cell on nose  . Cataract    bil cataracts removed  . Chronic kidney disease    left kidney had a diverticulum - surgery partial nephrectomy  . Ectopic atrial tachycardia (HCC)    nonsustained  . GERD (gastroesophageal reflux disease)   . Glaucoma   . History of migraine   . Hx of cardiovascular stress test    a. ETT-Echo (07/2013):  Normal. //  b. ETT-Echo 6/17 - Normal study after maximal exercise  . IBS (irritable bowel syndrome)   . Osteopenia   . Vasovagal syncope 09/30/2017    Past Surgical History:  Procedure Laterality Date  . ABDOMINAL HYSTERECTOMY  1975  . ABDOMINAL HYSTERECTOMY    . APPENDECTOMY    . CATARACT EXTRACTION W/ INTRAOCULAR LENS  IMPLANT, BILATERAL    .  COLONOSCOPY    . HEMORROIDECTOMY  1973  . MOHS SURGERY    . PARTIAL NEPHRECTOMY  1971   diverticulum on left  . UPPER GASTROINTESTINAL ENDOSCOPY      Current Outpatient Medications  Medication Sig Dispense Refill  . Brinzolamide-Brimonidine (SIMBRINZA) 1-0.2 % SUSP Apply 1 drop to eye 3 (three) times daily.    . Calcium Carbonate-Vitamin D (CALTRATE 600+D PO) Take 1,200 mg by mouth daily.      . Cholecalciferol (VITAMIN D) 2000 UNITS tablet Take 2,000 Units by mouth daily.      Marland Kitchen latanoprost (XALATAN) 0.005 % ophthalmic solution Place 1 drop into both eyes at bedtime.    Marland Kitchen PROLIA 60 MG/ML SOLN injection as directed.    Alveda Reasons 20 MG TABS tablet TAKE 1 TABLET BY MOUTH ONCE DAILY WITH  SUPPER 90 tablet 1  . metoprolol tartrate (LOPRESSOR) 25 MG tablet Take 1 tablet (25 mg total) by mouth 2 (two) times daily. 180 tablet 3   Current Facility-Administered Medications  Medication Dose Route Frequency Provider Last Rate Last Dose  . 0.9 %  sodium chloride infusion  500 mL Intravenous Continuous Ladene Artist, MD        Allergies:   Patient has no known allergies.   Social History:  The patient  reports that she quit smoking about 56 years ago. Her smoking use included cigarettes. She has a 10.00 pack-year  smoking history. She has never used smokeless tobacco. She reports current alcohol use of about 1.0 standard drinks of alcohol per week. She reports that she does not use drugs.   Family History:  The patient's family history includes Colon cancer in her maternal aunt; Throat cancer in her father.   ROS:  Please see the history of present illness.   All other systems are personally reviewed and negative.    Exam:    Vital Signs:  BP (!) 116/59   Pulse 64   Ht 5\' 6"  (1.676 m)   Wt 118 lb (53.5 kg)   BMI 19.05 kg/m   Well appearing, NAD,  Normal WOB   Labs/Other Tests and Data Reviewed:    Recent Labs: 04/06/2018: ALT 15; BUN 19; Creatinine, Ser 0.54; Hemoglobin 13.4;  Platelets 474.0; Potassium 4.4; Sodium 135; TSH 3.07   Wt Readings from Last 3 Encounters:  02/15/19 118 lb (53.5 kg)  07/12/18 120 lb (54.4 kg)  04/06/18 118 lb 3.2 oz (53.6 kg)     ASSESSMENT & PLAN:    1.  Palpitations Likely due to PACs and nonsustained Atach Doing well currently, Palpitations are controlled She is on eliquis for chads2vasc score of 3.   Follow-up:  EP PA in 6 months   Patient Risk:  after full review of this patients clinical status, I feel that they are at moderate risk at this time.  Today, I have spent 15 minutes with the patient with telehealth technology discussing arrhythmia management .    Army Fossa, MD  02/15/2019 9:02 AM     [facility] [address] [place] [address] [phone] (office) [phone] (fax)

## 2019-03-30 ENCOUNTER — Encounter: Payer: Self-pay | Admitting: Internal Medicine

## 2019-03-30 ENCOUNTER — Ambulatory Visit (INDEPENDENT_AMBULATORY_CARE_PROVIDER_SITE_OTHER): Payer: Medicare HMO | Admitting: Internal Medicine

## 2019-03-30 ENCOUNTER — Other Ambulatory Visit: Payer: Self-pay

## 2019-03-30 VITALS — BP 120/80 | HR 80 | Temp 98.5°F | Ht 66.0 in | Wt 121.4 lb

## 2019-03-30 DIAGNOSIS — H6123 Impacted cerumen, bilateral: Secondary | ICD-10-CM

## 2019-03-30 NOTE — Patient Instructions (Signed)
-  Nice meeting you today!!  -Will see you in August!

## 2019-03-30 NOTE — Progress Notes (Signed)
Acute Office Visit     CC/Reason for Visit: Can't hear from right ear for 2 weeks  HPI: Destiny Sanchez is a 78 y.o. female who is coming in today for the above mentioned reasons. Hearing loss of R>L ear for about 2 weeks that she has noticed. No ear pain, congestion. Thinks her sinuses are blocked.   Past Medical/Surgical History: Past Medical History:  Diagnosis Date  . Adenomatous colon polyp 06/2006  . Allergy   . Anemia   . Anxiety   . Blood transfusion 1967 &1971  . Cancer ([place])    basal cell on nose  . Cataract    bil cataracts removed  . Chronic kidney disease    left kidney had a diverticulum - surgery partial nephrectomy  . Ectopic atrial tachycardia (HCC)    nonsustained  . GERD (gastroesophageal reflux disease)   . Glaucoma   . History of migraine   . Hx of cardiovascular stress test    a. ETT-Echo (07/2013):  Normal. //  b. ETT-Echo 6/17 - Normal study after maximal exercise  . IBS (irritable bowel syndrome)   . Osteopenia   . Vasovagal syncope 09/30/2017    Past Surgical History:  Procedure Laterality Date  . ABDOMINAL HYSTERECTOMY  1975  . ABDOMINAL HYSTERECTOMY    . APPENDECTOMY    . CATARACT EXTRACTION W/ INTRAOCULAR LENS  IMPLANT, BILATERAL    . COLONOSCOPY    . HEMORROIDECTOMY  1973  . MOHS SURGERY    . PARTIAL NEPHRECTOMY  1971   diverticulum on left  . UPPER GASTROINTESTINAL ENDOSCOPY      Social History:  reports that she quit smoking about 56 years ago. Her smoking use included cigarettes. She has a 10.00 pack-year smoking history. She has never used smokeless tobacco. She reports current alcohol use of about 1.0 standard drinks of alcohol per week. She reports that she does not use drugs.  Allergies: No Known Allergies  Family History:  Family History  Problem Relation Age of Onset  . Colon cancer Maternal Aunt   . Throat cancer Father        larynx cancer  . Esophageal cancer Neg Hx   . Pancreatic cancer Neg Hx   .  Rectal cancer Neg Hx   . Stomach cancer Neg Hx      Current Outpatient Medications:  .  Calcium Carbonate-Vitamin D (CALTRATE 600+D PO), Take 1,200 mg by mouth daily.  , Disp: , Rfl:  .  Cholecalciferol (VITAMIN D) 2000 UNITS tablet, Take 2,000 Units by mouth daily.  , Disp: , Rfl:  .  latanoprost (XALATAN) 0.005 % ophthalmic solution, Place 1 drop into both eyes at bedtime., Disp: , Rfl:  .  metoprolol tartrate (LOPRESSOR) 25 MG tablet, Take 1 tablet (25 mg total) by mouth 2 (two) times daily., Disp: 180 tablet, Rfl: 3 .  PROLIA 60 MG/ML SOLN injection, as directed., Disp: , Rfl:  .  XARELTO 20 MG TABS tablet, TAKE 1 TABLET BY MOUTH ONCE DAILY WITH  SUPPER, Disp: 90 tablet, Rfl: 1 .  Brinzolamide-Brimonidine (SIMBRINZA) 1-0.2 % SUSP, Apply 1 drop to eye 3 (three) times daily., Disp: , Rfl:   Current Facility-Administered Medications:  .  0.9 %  sodium chloride infusion, 500 mL, Intravenous, Continuous, Ladene Artist, MD  Review of Systems:  Constitutional: Denies fever, chills, diaphoresis, appetite change and fatigue.  HEENT: Denies photophobia, eye pain, redness, ear pain, congestion, sore throat, rhinorrhea, sneezing, mouth sores, trouble swallowing,  neck pain, neck stiffness and tinnitus.   Respiratory: Denies SOB, DOE, cough, chest tightness,  and wheezing.   Cardiovascular: Denies chest pain, palpitations and leg swelling.  Gastrointestinal: Denies nausea, vomiting, abdominal pain, diarrhea, constipation, blood in stool and abdominal distention.  Genitourinary: Denies dysuria, urgency, frequency, hematuria, flank pain and difficulty urinating.  Endocrine: Denies: hot or cold intolerance, sweats, changes in hair or nails, polyuria, polydipsia. Musculoskeletal: Denies myalgias, back pain, joint swelling, arthralgias and gait problem.  Skin: Denies pallor, rash and wound.  Neurological: Denies dizziness, seizures, syncope, weakness, light-headedness, numbness and headaches.   Hematological: Denies adenopathy. Easy bruising, personal or family bleeding history  Psychiatric/Behavioral: Denies suicidal ideation, mood changes, confusion, nervousness, sleep disturbance and agitation    Physical Exam: Vitals:   03/30/19 1551  BP: 120/80  Pulse: 80  Temp: 98.5 F (36.9 C)  TempSrc: Temporal  SpO2: 97%  Weight: 121 lb 6.4 oz (55.1 kg)  Height: 5\' 6"  (1.676 m)    Body mass index is 19.59 kg/m.   Constitutional: NAD, calm, comfortable Eyes: PERRL, lids and conjunctivae normal ENMT: Mucous membranes are moist.Tympanic membrane is obstructed bilaterally by cerumen. Psychiatric: Normal judgment and insight. Alert and oriented x 3. Normal mood.    Impression and Plan:  Impacted cerumen, bilateral -Cerumen Desimpaction  Warm water was applied and gentle ear lavage performed on bilateral ears. There were no complications and following the desimpaction the tympanic membranes were visible. Tympanic membranes are intact following the procedure. Auditory canals are normal. The patient reported relief of symptoms after removal of cerumen.    Patient Instructions  -Nice meeting you today!!  -Will see you in August!     Lelon Frohlich, MD [facility]

## 2019-04-12 ENCOUNTER — Encounter: Payer: Self-pay | Admitting: Internal Medicine

## 2019-04-12 ENCOUNTER — Other Ambulatory Visit: Payer: Self-pay

## 2019-04-12 ENCOUNTER — Ambulatory Visit (INDEPENDENT_AMBULATORY_CARE_PROVIDER_SITE_OTHER): Payer: Medicare HMO | Admitting: Internal Medicine

## 2019-04-12 VITALS — BP 110/70 | HR 77 | Temp 97.1°F | Ht 66.0 in | Wt 119.0 lb

## 2019-04-12 DIAGNOSIS — K219 Gastro-esophageal reflux disease without esophagitis: Secondary | ICD-10-CM | POA: Diagnosis not present

## 2019-04-12 DIAGNOSIS — Z0000 Encounter for general adult medical examination without abnormal findings: Secondary | ICD-10-CM | POA: Diagnosis not present

## 2019-04-12 DIAGNOSIS — I480 Paroxysmal atrial fibrillation: Secondary | ICD-10-CM | POA: Diagnosis not present

## 2019-04-12 DIAGNOSIS — M810 Age-related osteoporosis without current pathological fracture: Secondary | ICD-10-CM | POA: Diagnosis not present

## 2019-04-12 DIAGNOSIS — E559 Vitamin D deficiency, unspecified: Secondary | ICD-10-CM | POA: Diagnosis not present

## 2019-04-12 DIAGNOSIS — H409 Unspecified glaucoma: Secondary | ICD-10-CM | POA: Diagnosis not present

## 2019-04-12 DIAGNOSIS — I4819 Other persistent atrial fibrillation: Secondary | ICD-10-CM

## 2019-04-12 LAB — CBC WITH DIFFERENTIAL/PLATELET
Basophils Absolute: 0.1 10*3/uL (ref 0.0–0.1)
Basophils Relative: 1 % (ref 0.0–3.0)
Eosinophils Absolute: 0.1 10*3/uL (ref 0.0–0.7)
Eosinophils Relative: 2.1 % (ref 0.0–5.0)
HCT: 42.2 % (ref 36.0–46.0)
Hemoglobin: 14.3 g/dL (ref 12.0–15.0)
Lymphocytes Relative: 14.3 % (ref 12.0–46.0)
Lymphs Abs: 0.9 10*3/uL (ref 0.7–4.0)
MCHC: 33.7 g/dL (ref 30.0–36.0)
MCV: 96.9 fl (ref 78.0–100.0)
Monocytes Absolute: 0.4 10*3/uL (ref 0.1–1.0)
Monocytes Relative: 6.4 % (ref 3.0–12.0)
Neutro Abs: 5 10*3/uL (ref 1.4–7.7)
Neutrophils Relative %: 76.2 % (ref 43.0–77.0)
Platelets: 409 10*3/uL — ABNORMAL HIGH (ref 150.0–400.0)
RBC: 4.36 Mil/uL (ref 3.87–5.11)
RDW: 13.4 % (ref 11.5–15.5)
WBC: 6.5 10*3/uL (ref 4.0–10.5)

## 2019-04-12 LAB — COMPREHENSIVE METABOLIC PANEL
ALT: 25 U/L (ref 0–35)
AST: 22 U/L (ref 0–37)
Albumin: 4.4 g/dL (ref 3.5–5.2)
Alkaline Phosphatase: 47 U/L (ref 39–117)
BUN: 16 mg/dL (ref 6–23)
CO2: 27 mEq/L (ref 19–32)
Calcium: 9.2 mg/dL (ref 8.4–10.5)
Chloride: 104 mEq/L (ref 96–112)
Creatinine, Ser: 0.53 mg/dL (ref 0.40–1.20)
GFR: 110.88 mL/min (ref >60.00)
Glucose, Bld: 83 mg/dL (ref 70–99)
Potassium: 4.2 mEq/L (ref 3.5–5.1)
Sodium: 140 mEq/L (ref 135–145)
Total Bilirubin: 0.7 mg/dL (ref 0.2–1.2)
Total Protein: 6.4 g/dL (ref 6.0–8.3)

## 2019-04-12 LAB — TSH: TSH: 4.22 u[IU]/mL (ref 0.35–4.50)

## 2019-04-12 LAB — LIPID PANEL
Cholesterol: 182 mg/dL (ref 0–200)
HDL: 52.1 mg/dL (ref >39.00)
LDL Cholesterol: 106 mg/dL — ABNORMAL HIGH (ref 0–99)
NonHDL: 129.76
Total CHOL/HDL Ratio: 3
Triglycerides: 119 mg/dL (ref 0.0–149.0)
VLDL: 23.8 mg/dL (ref 0.0–40.0)

## 2019-04-12 LAB — VITAMIN B12: Vitamin B-12: 178 pg/mL — ABNORMAL LOW (ref 211–911)

## 2019-04-12 LAB — HEMOGLOBIN A1C: Hgb A1c MFr Bld: 5.4 % (ref 4.6–6.5)

## 2019-04-12 LAB — VITAMIN D 25 HYDROXY (VIT D DEFICIENCY, FRACTURES): VITD: 41.9 ng/mL (ref 30.00–100.00)

## 2019-04-12 NOTE — Progress Notes (Signed)
Established Patient Office Visit     CC/Reason for Visit: Annual preventive exam and subsequent Medicare wellness visit  HPI: Destiny Sanchez is a 78 y.o. female who is coming in today for the above mentioned reasons. Past Medical History is significant for: Paroxysmal atrial fibrillation rate controlled on metoprolol and anticoagulated on Xarelto followed by Dr. Rayann Heman, GERD not on PPI therapy, history of migraine headaches, vitamin D deficiency and osteoporosis.  She complains of some chronic diarrhea and "stomach gurgling" that she attributes to eating dairy products.  Other than that she has no acute complaints today.  She does state that she has been feeling a little "down" as she has had a lot of significant life events recently.  We discussed shingles vaccination today, she has routine eye and dental care.  Due to age she elects to not pursue further cancer screening.   Past Medical/Surgical History: Past Medical History:  Diagnosis Date  . Adenomatous colon polyp 06/2006  . Allergy   . Anemia   . Anxiety   . Blood transfusion 1967 &1971  . Cancer ([place])    basal cell on nose  . Cataract    bil cataracts removed  . Chronic kidney disease    left kidney had a diverticulum - surgery partial nephrectomy  . Ectopic atrial tachycardia (HCC)    nonsustained  . GERD (gastroesophageal reflux disease)   . Glaucoma   . History of migraine   . Hx of cardiovascular stress test    a. ETT-Echo (07/2013):  Normal. //  b. ETT-Echo 6/17 - Normal study after maximal exercise  . IBS (irritable bowel syndrome)   . Osteopenia   . Vasovagal syncope 09/30/2017    Past Surgical History:  Procedure Laterality Date  . ABDOMINAL HYSTERECTOMY  1975  . ABDOMINAL HYSTERECTOMY    . APPENDECTOMY    . CATARACT EXTRACTION W/ INTRAOCULAR LENS  IMPLANT, BILATERAL    . COLONOSCOPY    . HEMORROIDECTOMY  1973  . MOHS SURGERY    . PARTIAL NEPHRECTOMY  1971   diverticulum on left  . UPPER  GASTROINTESTINAL ENDOSCOPY      Social History:  reports that she quit smoking about 56 years ago. Her smoking use included cigarettes. She has a 10.00 pack-year smoking history. She has never used smokeless tobacco. She reports current alcohol use of about 1.0 standard drinks of alcohol per week. She reports that she does not use drugs.  Allergies: No Known Allergies  Family History:  Family History  Problem Relation Age of Onset  . Colon cancer Maternal Aunt   . Throat cancer Father        larynx cancer  . Esophageal cancer Neg Hx   . Pancreatic cancer Neg Hx   . Rectal cancer Neg Hx   . Stomach cancer Neg Hx      Current Outpatient Medications:  .  Brinzolamide-Brimonidine (SIMBRINZA) 1-0.2 % SUSP, Apply 1 drop to eye 3 (three) times daily., Disp: , Rfl:  .  Calcium Carbonate-Vitamin D (CALTRATE 600+D PO), Take 1,200 mg by mouth daily.  , Disp: , Rfl:  .  Cholecalciferol (VITAMIN D) 2000 UNITS tablet, Take 2,000 Units by mouth daily.  , Disp: , Rfl:  .  latanoprost (XALATAN) 0.005 % ophthalmic solution, Place 1 drop into both eyes at bedtime., Disp: , Rfl:  .  metoprolol tartrate (LOPRESSOR) 25 MG tablet, Take 1 tablet (25 mg total) by mouth 2 (two) times daily., Disp: 180 tablet, Rfl:  3 .  PROLIA 60 MG/ML SOLN injection, as directed., Disp: , Rfl:  .  XARELTO 20 MG TABS tablet, TAKE 1 TABLET BY MOUTH ONCE DAILY WITH  SUPPER, Disp: 90 tablet, Rfl: 1  Current Facility-Administered Medications:  .  0.9 %  sodium chloride infusion, 500 mL, Intravenous, Continuous, Ladene Artist, MD  Review of Systems:  Constitutional: Denies fever, chills, diaphoresis, appetite change and fatigue.  HEENT: Denies photophobia, eye pain, redness, hearing loss, ear pain, congestion, sore throat, rhinorrhea, sneezing, mouth sores, trouble swallowing, neck pain, neck stiffness and tinnitus.   Respiratory: Denies SOB, DOE, cough, chest tightness,  and wheezing.   Cardiovascular: Denies chest pain,  palpitations and leg swelling.  Gastrointestinal: Denies nausea, vomiting, abdominal pain,  constipation, blood in stool and abdominal distention.  Genitourinary: Denies dysuria, urgency, frequency, hematuria, flank pain and difficulty urinating.  Endocrine: Denies: hot or cold intolerance, sweats, changes in hair or nails, polyuria, polydipsia. Musculoskeletal: Denies myalgias, back pain, joint swelling, arthralgias and gait problem.  Skin: Denies pallor, rash and wound.  Neurological: Denies dizziness, seizures, syncope, weakness, light-headedness, numbness and headaches.  Hematological: Denies adenopathy. Easy bruising, personal or family bleeding history  Psychiatric/Behavioral: Denies suicidal ideation, mood changes, confusion, nervousness, sleep disturbance and agitation    Physical Exam: Vitals:   04/12/19 0702  BP: 110/70  Pulse: 77  Temp: (!) 97.1 F (36.2 C)  TempSrc: Temporal  SpO2: 98%  Weight: 119 lb (54 kg)  Height: '5\' 6"'$  (1.676 m)    Body mass index is 19.21 kg/m.   Constitutional: NAD, calm, comfortable Eyes: PERRL, lids and conjunctivae normal ENMT: Mucous membranes are moist.  Neck: normal, supple, no masses, no thyromegaly Respiratory: clear to auscultation bilaterally, no wheezing, no crackles. Normal respiratory effort. No accessory muscle use.  Cardiovascular: Regular rate and rhythm, no murmurs / rubs / gallops. No extremity edema. 2+ pedal pulses. No carotid bruits.  Abdomen: no tenderness, no masses palpated. No hepatosplenomegaly. Bowel sounds positive.  Musculoskeletal: no clubbing / cyanosis. No joint deformity upper and lower extremities. Good ROM, no contractures. Normal muscle tone.  Skin: no rashes, lesions, ulcers. No induration Neurologic: CN 2-12 grossly intact. Sensation intact, DTR normal. Strength 5/5 in all 4.  Psychiatric: Normal judgment and insight. Alert and oriented x 3. Normal mood.    Subsequent Medicare wellness visit   1.  Risk factors, based on past  M,S,F -cardiovascular disease risk factors include age   27.  Physical activities: She is to be very active at the gym which is now closed due to COVID-19 pandemic.  She walks every day and also does all of her housework.   3.  Depression/mood:  She is a little depressed with PHQ 9 6 today   4.  Hearing:  No issues   5.  ADL's: Independent in all ADLs   6.  Fall risk:  Low fall risk   7.  Home safety: No problems identified   8.  Height weight, and visual acuity: Height and weight as above, visual acuity is 20/50 with the right eye, 20/40 with the left eye and 20/40 with both eyes   9.  Counseling:  Counseled on food diary and avoidance of dairy products for the next 2 weeks to see if this makes any improvement in her diarrhea   10. Lab orders based on risk factors: Laboratory update will be reviewed   11. Referral :  None today   12. Care plan:  Follow-up with me in 6  months   13. Cognitive assessment:  No cognitive impairment   14. Screening: Patient provided with a written and personalized 5-10 year screening schedule in the AVS.   yes   15. Provider List Update:   PCP, ophthalmology Dr. Murvin Natal, cardiologist Dr. Rayann Heman  16. Advance Directives: Full code     Office Visit from 04/12/2019 in [place] at [facility]  PHQ-9 Total Score  6      Fall Risk  04/12/2019 03/30/2019 07/18/2013  Falls in the past year? 0 0 No  Number falls in past yr: 0 0 -  Injury with Fall? 0 0 -     Impression and Plan:  Encounter for preventive health examination  -She has routine eye and dental care. -We have discussed shingles vaccination series which she will receive at her pharmacy, otherwise vaccinations are up-to-date. -She has elected to not pursue further colonoscopies, mammograms, Pap smears due to her age, I agree. -We have discussed healthy lifestyle in detail. -Labs will be updated today. -She does feel a little sad today due to ongoing  life events, PHQ 9 score was 6.  Check TSH and vitamin B12/vitamin D today.  Reassess at next visit.  Paroxysmal atrial fibrillation (HCC), Chronic  -Rate controlled on metoprolol and anticoagulated on Xarelto. -Follows routinely with Dr. Rayann Heman  Gastroesophageal reflux disease without esophagitis -Well-controlled, not on medications  Osteoporosis, post-menopausal -On Prolia, repeat DEXA next year  Vitamin D deficiency  -Check vit D levels today.  Glaucoma of both eyes, unspecified glaucoma type -F/u as scheduled with ophthalmology.     Patient Instructions  -Nice seeing you today!!  -Lab work today; will notify you once results are available.  -Remember to get shingles vaccination series at your pharmacy.   Preventive Care 29 Years and Older, Female Preventive care refers to lifestyle choices and visits with your health care provider that can promote health and wellness. This includes:  A yearly physical exam. This is also called an annual well check.  Regular dental and eye exams.  Immunizations.  Screening for certain conditions.  Healthy lifestyle choices, such as diet and exercise. What can I expect for my preventive care visit? Physical exam Your health care provider will check:  Height and weight. These may be used to calculate body mass index (BMI), which is a measurement that tells if you are at a healthy weight.  Heart rate and blood pressure.  Your skin for abnormal spots. Counseling Your health care provider may ask you questions about:  Alcohol, tobacco, and drug use.  Emotional well-being.  Home and relationship well-being.  Sexual activity.  Eating habits.  History of falls.  Memory and ability to understand (cognition).  Work and work Statistician.  Pregnancy and menstrual history. What immunizations do I need?  Influenza (flu) vaccine  This is recommended every year. Tetanus, diphtheria, and pertussis (Tdap) vaccine  You may  need a Td booster every 10 years. Varicella (chickenpox) vaccine  You may need this vaccine if you have not already been vaccinated. Zoster (shingles) vaccine  You may need this after age 20. Pneumococcal conjugate (PCV13) vaccine  One dose is recommended after age 11. Pneumococcal polysaccharide (PPSV23) vaccine  One dose is recommended after age 35. Measles, mumps, and rubella (MMR) vaccine  You may need at least one dose of MMR if you were born in 1957 or later. You may also need a second dose. Meningococcal conjugate (MenACWY) vaccine  You may need this if you have certain conditions. Hepatitis  A vaccine  You may need this if you have certain conditions or if you travel or work in places where you may be exposed to hepatitis A. Hepatitis B vaccine  You may need this if you have certain conditions or if you travel or work in places where you may be exposed to hepatitis B. Haemophilus influenzae type b (Hib) vaccine  You may need this if you have certain conditions. You may receive vaccines as individual doses or as more than one vaccine together in one shot (combination vaccines). Talk with your health care provider about the risks and benefits of combination vaccines. What tests do I need? Blood tests  Lipid and cholesterol levels. These may be checked every 5 years, or more frequently depending on your overall health.  Hepatitis C test.  Hepatitis B test. Screening  Lung cancer screening. You may have this screening every year starting at age 8 if you have a 30-pack-year history of smoking and currently smoke or have quit within the past 15 years.  Colorectal cancer screening. All adults should have this screening starting at age 36 and continuing until age 65. Your health care provider may recommend screening at age 19 if you are at increased risk. You will have tests every 1-10 years, depending on your results and the type of screening test.  Diabetes screening.  This is done by checking your blood sugar (glucose) after you have not eaten for a while (fasting). You may have this done every 1-3 years.  Mammogram. This may be done every 1-2 years. Talk with your health care provider about how often you should have regular mammograms.  BRCA-related cancer screening. This may be done if you have a family history of breast, ovarian, tubal, or peritoneal cancers. Other tests  Sexually transmitted disease (STD) testing.  Bone density scan. This is done to screen for osteoporosis. You may have this done starting at age 33. Follow these instructions at home: Eating and drinking  Eat a diet that includes fresh fruits and vegetables, whole grains, lean protein, and low-fat dairy products. Limit your intake of foods with high amounts of sugar, saturated fats, and salt.  Take vitamin and mineral supplements as recommended by your health care provider.  Do not drink alcohol if your health care provider tells you not to drink.  If you drink alcohol: ? Limit how much you have to 0-1 drink a day. ? Be aware of how much alcohol is in your drink. In the [place] one drink equals one 12 oz bottle of beer (355 mL), one 5 oz glass of wine (148 mL), or one 1 oz glass of hard liquor (44 mL). Lifestyle  Take daily care of your teeth and gums.  Stay active. Exercise for at least 30 minutes on 5 or more days each week.  Do not use any products that contain nicotine or tobacco, such as cigarettes, e-cigarettes, and chewing tobacco. If you need help quitting, ask your health care provider.  If you are sexually active, practice safe sex. Use a condom or other form of protection in order to prevent STIs (sexually transmitted infections).  Talk with your health care provider about taking a low-dose aspirin or statin. What's next?  Go to your health care provider once a year for a well check visit.  Ask your health care provider how often you should have your eyes and  teeth checked.  Stay up to date on all vaccines. This information is not intended to replace advice  given to you by your health care provider. Make sure you discuss any questions you have with your health care provider. Document Released: 09/12/2015 Document Revised: 08/10/2018 Document Reviewed: 08/10/2018 Elsevier Patient Education  2020 [place], MD [facility]

## 2019-04-12 NOTE — Patient Instructions (Signed)
-Nice seeing you today!!  -Lab work today; will notify you once results are available.  -Remember to get shingles vaccination series at your pharmacy.   Preventive Care 80 Years and Older, Female Preventive care refers to lifestyle choices and visits with your health care provider that can promote health and wellness. This includes:  A yearly physical exam. This is also called an annual well check.  Regular dental and eye exams.  Immunizations.  Screening for certain conditions.  Healthy lifestyle choices, such as diet and exercise. What can I expect for my preventive care visit? Physical exam Your health care provider will check:  Height and weight. These may be used to calculate body mass index (BMI), which is a measurement that tells if you are at a healthy weight.  Heart rate and blood pressure.  Your skin for abnormal spots. Counseling Your health care provider may ask you questions about:  Alcohol, tobacco, and drug use.  Emotional well-being.  Home and relationship well-being.  Sexual activity.  Eating habits.  History of falls.  Memory and ability to understand (cognition).  Work and work Statistician.  Pregnancy and menstrual history. What immunizations do I need?  Influenza (flu) vaccine  This is recommended every year. Tetanus, diphtheria, and pertussis (Tdap) vaccine  You may need a Td booster every 10 years. Varicella (chickenpox) vaccine  You may need this vaccine if you have not already been vaccinated. Zoster (shingles) vaccine  You may need this after age 47. Pneumococcal conjugate (PCV13) vaccine  One dose is recommended after age 62. Pneumococcal polysaccharide (PPSV23) vaccine  One dose is recommended after age 49. Measles, mumps, and rubella (MMR) vaccine  You may need at least one dose of MMR if you were born in 1957 or later. You may also need a second dose. Meningococcal conjugate (MenACWY) vaccine  You may need this if  you have certain conditions. Hepatitis A vaccine  You may need this if you have certain conditions or if you travel or work in places where you may be exposed to hepatitis A. Hepatitis B vaccine  You may need this if you have certain conditions or if you travel or work in places where you may be exposed to hepatitis B. Haemophilus influenzae type b (Hib) vaccine  You may need this if you have certain conditions. You may receive vaccines as individual doses or as more than one vaccine together in one shot (combination vaccines). Talk with your health care provider about the risks and benefits of combination vaccines. What tests do I need? Blood tests  Lipid and cholesterol levels. These may be checked every 5 years, or more frequently depending on your overall health.  Hepatitis C test.  Hepatitis B test. Screening  Lung cancer screening. You may have this screening every year starting at age 64 if you have a 30-pack-year history of smoking and currently smoke or have quit within the past 15 years.  Colorectal cancer screening. All adults should have this screening starting at age 40 and continuing until age 62. Your health care provider may recommend screening at age 65 if you are at increased risk. You will have tests every 1-10 years, depending on your results and the type of screening test.  Diabetes screening. This is done by checking your blood sugar (glucose) after you have not eaten for a while (fasting). You may have this done every 1-3 years.  Mammogram. This may be done every 1-2 years. Talk with your health care provider about how  often you should have regular mammograms.  BRCA-related cancer screening. This may be done if you have a family history of breast, ovarian, tubal, or peritoneal cancers. Other tests  Sexually transmitted disease (STD) testing.  Bone density scan. This is done to screen for osteoporosis. You may have this done starting at age 65. Follow these  instructions at home: Eating and drinking  Eat a diet that includes fresh fruits and vegetables, whole grains, lean protein, and low-fat dairy products. Limit your intake of foods with high amounts of sugar, saturated fats, and salt.  Take vitamin and mineral supplements as recommended by your health care provider.  Do not drink alcohol if your health care provider tells you not to drink.  If you drink alcohol: ? Limit how much you have to 0-1 drink a day. ? Be aware of how much alcohol is in your drink. In the [place] one drink equals one 12 oz bottle of beer (355 mL), one 5 oz glass of wine (148 mL), or one 1 oz glass of hard liquor (44 mL). Lifestyle  Take daily care of your teeth and gums.  Stay active. Exercise for at least 30 minutes on 5 or more days each week.  Do not use any products that contain nicotine or tobacco, such as cigarettes, e-cigarettes, and chewing tobacco. If you need help quitting, ask your health care provider.  If you are sexually active, practice safe sex. Use a condom or other form of protection in order to prevent STIs (sexually transmitted infections).  Talk with your health care provider about taking a low-dose aspirin or statin. What's next?  Go to your health care provider once a year for a well check visit.  Ask your health care provider how often you should have your eyes and teeth checked.  Stay up to date on all vaccines. This information is not intended to replace advice given to you by your health care provider. Make sure you discuss any questions you have with your health care provider. Document Released: 09/12/2015 Document Revised: 08/10/2018 Document Reviewed: 08/10/2018 Elsevier Patient Education  2020 [employer] Inc[employer]  

## 2019-04-20 ENCOUNTER — Ambulatory Visit (INDEPENDENT_AMBULATORY_CARE_PROVIDER_SITE_OTHER): Payer: Medicare HMO | Admitting: *Deleted

## 2019-04-20 ENCOUNTER — Other Ambulatory Visit: Payer: Self-pay

## 2019-04-20 DIAGNOSIS — E538 Deficiency of other specified B group vitamins: Secondary | ICD-10-CM | POA: Diagnosis not present

## 2019-04-20 MED ORDER — CYANOCOBALAMIN 1000 MCG/ML IJ SOLN
1000.0000 ug | Freq: Once | INTRAMUSCULAR | Status: AC
  Administered 2019-04-20: 1000 ug via INTRAMUSCULAR

## 2019-04-20 NOTE — Progress Notes (Signed)
Patient in for B-12 injection. Injection administered with no reaction  

## 2019-04-25 DIAGNOSIS — L821 Other seborrheic keratosis: Secondary | ICD-10-CM | POA: Diagnosis not present

## 2019-04-25 DIAGNOSIS — Z85828 Personal history of other malignant neoplasm of skin: Secondary | ICD-10-CM | POA: Diagnosis not present

## 2019-04-25 DIAGNOSIS — D2271 Melanocytic nevi of right lower limb, including hip: Secondary | ICD-10-CM | POA: Diagnosis not present

## 2019-04-25 DIAGNOSIS — L308 Other specified dermatitis: Secondary | ICD-10-CM | POA: Diagnosis not present

## 2019-04-25 DIAGNOSIS — D1801 Hemangioma of skin and subcutaneous tissue: Secondary | ICD-10-CM | POA: Diagnosis not present

## 2019-04-30 ENCOUNTER — Telehealth: Payer: Self-pay | Admitting: *Deleted

## 2019-04-30 ENCOUNTER — Other Ambulatory Visit: Payer: Self-pay

## 2019-04-30 ENCOUNTER — Ambulatory Visit (INDEPENDENT_AMBULATORY_CARE_PROVIDER_SITE_OTHER): Payer: Medicare HMO | Admitting: *Deleted

## 2019-04-30 DIAGNOSIS — E538 Deficiency of other specified B group vitamins: Secondary | ICD-10-CM

## 2019-04-30 MED ORDER — CYANOCOBALAMIN 1000 MCG/ML IJ SOLN
1000.0000 ug | Freq: Once | INTRAMUSCULAR | Status: AC
  Administered 2019-04-30: 10:00:00 1000 ug via INTRAMUSCULAR

## 2019-04-30 NOTE — Telephone Encounter (Signed)
Patient wants to know when her B-12 level will be checked again. Clinic RN did not see an upcoming appt with PCP

## 2019-04-30 NOTE — Progress Notes (Signed)
Patient in clinic today for B-12 injection. B-12 administered with no reactions.

## 2019-05-01 NOTE — Telephone Encounter (Signed)
No need to recheck for now. Should continue monthly IM injections for lifelong.

## 2019-05-02 DIAGNOSIS — Z1231 Encounter for screening mammogram for malignant neoplasm of breast: Secondary | ICD-10-CM | POA: Diagnosis not present

## 2019-05-02 DIAGNOSIS — Z0000 Encounter for general adult medical examination without abnormal findings: Secondary | ICD-10-CM | POA: Diagnosis not present

## 2019-05-02 NOTE — Telephone Encounter (Signed)
Spoke with patient and B12 injection scheduled.

## 2019-05-09 ENCOUNTER — Ambulatory Visit (INDEPENDENT_AMBULATORY_CARE_PROVIDER_SITE_OTHER): Payer: Medicare HMO | Admitting: *Deleted

## 2019-05-09 ENCOUNTER — Other Ambulatory Visit: Payer: Self-pay

## 2019-05-09 DIAGNOSIS — E538 Deficiency of other specified B group vitamins: Secondary | ICD-10-CM

## 2019-05-09 DIAGNOSIS — Z23 Encounter for immunization: Secondary | ICD-10-CM | POA: Diagnosis not present

## 2019-05-09 MED ORDER — CYANOCOBALAMIN 1000 MCG/ML IJ SOLN
1000.0000 ug | Freq: Once | INTRAMUSCULAR | Status: AC
  Administered 2019-05-09: 1000 ug via INTRAMUSCULAR

## 2019-05-09 NOTE — Progress Notes (Signed)
Per orders of Dr. Hernandez, injection of B12 given by Gaylia Kassel. Patient tolerated injection well.  

## 2019-05-10 DIAGNOSIS — R69 Illness, unspecified: Secondary | ICD-10-CM | POA: Diagnosis not present

## 2019-05-11 ENCOUNTER — Ambulatory Visit: Payer: Medicare HMO

## 2019-05-16 ENCOUNTER — Other Ambulatory Visit: Payer: Self-pay

## 2019-05-16 ENCOUNTER — Ambulatory Visit (INDEPENDENT_AMBULATORY_CARE_PROVIDER_SITE_OTHER): Payer: Medicare HMO | Admitting: *Deleted

## 2019-05-16 DIAGNOSIS — E538 Deficiency of other specified B group vitamins: Secondary | ICD-10-CM

## 2019-05-16 MED ORDER — CYANOCOBALAMIN 1000 MCG/ML IJ SOLN
1000.0000 ug | Freq: Once | INTRAMUSCULAR | Status: AC
  Administered 2019-05-16: 1000 ug via INTRAMUSCULAR

## 2019-05-16 NOTE — Progress Notes (Signed)
Patient in clinic for B-12 injection. B-12 injection administered in left deltoid with no adverse reaction.

## 2019-06-01 DIAGNOSIS — Z1331 Encounter for screening for depression: Secondary | ICD-10-CM | POA: Diagnosis not present

## 2019-06-01 DIAGNOSIS — I4891 Unspecified atrial fibrillation: Secondary | ICD-10-CM | POA: Diagnosis not present

## 2019-06-01 DIAGNOSIS — Z7901 Long term (current) use of anticoagulants: Secondary | ICD-10-CM | POA: Diagnosis not present

## 2019-06-01 DIAGNOSIS — G4700 Insomnia, unspecified: Secondary | ICD-10-CM | POA: Diagnosis not present

## 2019-06-01 DIAGNOSIS — E538 Deficiency of other specified B group vitamins: Secondary | ICD-10-CM | POA: Diagnosis not present

## 2019-06-01 DIAGNOSIS — C44320 Squamous cell carcinoma of skin of unspecified parts of face: Secondary | ICD-10-CM | POA: Diagnosis not present

## 2019-06-01 DIAGNOSIS — H409 Unspecified glaucoma: Secondary | ICD-10-CM | POA: Diagnosis not present

## 2019-06-01 DIAGNOSIS — M810 Age-related osteoporosis without current pathological fracture: Secondary | ICD-10-CM | POA: Diagnosis not present

## 2019-06-07 DIAGNOSIS — R69 Illness, unspecified: Secondary | ICD-10-CM | POA: Diagnosis not present

## 2019-06-18 ENCOUNTER — Ambulatory Visit: Payer: Medicare HMO

## 2019-06-19 DIAGNOSIS — M810 Age-related osteoporosis without current pathological fracture: Secondary | ICD-10-CM | POA: Diagnosis not present

## 2019-06-28 DIAGNOSIS — Z85828 Personal history of other malignant neoplasm of skin: Secondary | ICD-10-CM | POA: Diagnosis not present

## 2019-06-28 DIAGNOSIS — L308 Other specified dermatitis: Secondary | ICD-10-CM | POA: Diagnosis not present

## 2019-07-22 ENCOUNTER — Other Ambulatory Visit: Payer: Self-pay | Admitting: Internal Medicine

## 2019-07-22 DIAGNOSIS — I480 Paroxysmal atrial fibrillation: Secondary | ICD-10-CM

## 2019-07-23 NOTE — Telephone Encounter (Signed)
Age 78, weight 54kg, SCr 0.53 on 04/12/19, CrCl 16mL/min, last OV June 2020, afib/palpitations indication

## 2019-08-01 DIAGNOSIS — L308 Other specified dermatitis: Secondary | ICD-10-CM | POA: Diagnosis not present

## 2019-08-01 DIAGNOSIS — L920 Granuloma annulare: Secondary | ICD-10-CM | POA: Diagnosis not present

## 2019-08-01 DIAGNOSIS — Z85828 Personal history of other malignant neoplasm of skin: Secondary | ICD-10-CM | POA: Diagnosis not present

## 2019-08-01 DIAGNOSIS — C8400 Mycosis fungoides, unspecified site: Secondary | ICD-10-CM | POA: Diagnosis not present

## 2019-08-05 DIAGNOSIS — K047 Periapical abscess without sinus: Secondary | ICD-10-CM | POA: Diagnosis not present

## 2019-08-07 ENCOUNTER — Other Ambulatory Visit: Payer: Self-pay | Admitting: Internal Medicine

## 2019-08-12 NOTE — Progress Notes (Signed)
Cardiology Office Note Date:  08/14/2019  Patient ID:  Destiny Sanchez, Destiny Sanchez 10/24/1940, MRN [id number] PCP:  Prince Solian, MD  Electrophysiologist:  Dr. Rayann Heman     Chief Complaint: 6 mo f/u  History of Present Illness: Destiny Sanchez is a 78 y.o. female with history of Paroxysmal AFib, IBS, GERD, ectopic ATach, PACs, problem list also mentions vasovagal syncope.  She comes in today to be seen for Dr. Rayann Heman, last seen by him 02/15/2019 via tele-health visit.  She was doing well, h/o palpitations felt likely 2/2 PACs and nonsustained ATach, they were controlled, maintained on Xarelto.  Planned for APP visit in 6 months   She is doing great.  Very active, golfs regularly, always busy, exercises at the park without difficulty or exertional intolerances.  She recalls once in the last 52mo abut 30seconds of palpitations.  No CP, SOB, DOE.  No dizzy spells, near syncope or syncope  No bleeding or signs of bleeding   Past Medical History:  Diagnosis Date  . Adenomatous colon polyp 06/2006  . Allergy   . Anemia   . Anxiety   . Blood transfusion 1967 &1971  . Cancer ([place])    basal cell on nose  . Cataract    bil cataracts removed  . Chronic kidney disease    left kidney had a diverticulum - surgery partial nephrectomy  . Ectopic atrial tachycardia (HCC)    nonsustained  . GERD (gastroesophageal reflux disease)   . Glaucoma   . History of migraine   . Hx of cardiovascular stress test    a. ETT-Echo (07/2013):  Normal. //  b. ETT-Echo 6/17 - Normal study after maximal exercise  . IBS (irritable bowel syndrome)   . Osteopenia   . Vasovagal syncope 09/30/2017    Past Surgical History:  Procedure Laterality Date  . ABDOMINAL HYSTERECTOMY  1975  . ABDOMINAL HYSTERECTOMY    . APPENDECTOMY    . CATARACT EXTRACTION W/ INTRAOCULAR LENS  IMPLANT, BILATERAL    . COLONOSCOPY    . HEMORROIDECTOMY  1973  . MOHS SURGERY    . PARTIAL NEPHRECTOMY  1971   diverticulum on left  .  UPPER GASTROINTESTINAL ENDOSCOPY      Current Outpatient Medications  Medication Sig Dispense Refill  . cyanocobalamin (,VITAMIN B-12,) 1000 MCG/ML injection Inject 1,000 mcg into the skin every 30 (thirty) days.    . Brinzolamide-Brimonidine (SIMBRINZA) 1-0.2 % SUSP Apply 1 drop to eye 3 (three) times daily.    . Calcium Carbonate-Vitamin D (CALTRATE 600+D PO) Take 1,200 mg by mouth daily.      . Cholecalciferol (VITAMIN D) 2000 UNITS tablet Take 2,000 Units by mouth daily.      Marland Kitchen latanoprost (XALATAN) 0.005 % ophthalmic solution Place 1 drop into both eyes at bedtime.    . metoprolol tartrate (LOPRESSOR) 25 MG tablet Take 1 tablet by mouth twice daily 180 tablet 2  . PROLIA 60 MG/ML SOLN injection as directed.    Alveda Reasons 20 MG TABS tablet TAKE 1 TABLET BY MOUTH ONCE DAILY WITH SUPPER 90 tablet 1   Current Facility-Administered Medications  Medication Dose Route Frequency Provider Last Rate Last Admin  . 0.9 %  sodium chloride infusion  500 mL Intravenous Continuous Ladene Artist, MD        Allergies:   Lactose   Social History:  The patient  reports that she quit smoking about 56 years ago. Her smoking use included cigarettes. She has a 10.00 pack-year smoking history.  She has never used smokeless tobacco. She reports current alcohol use of about 1.0 standard drinks of alcohol per week. She reports that she does not use drugs.   Family History:  The patient's family history includes Colon cancer in her maternal aunt; Throat cancer in her father.  ROS:  Please see the history of present illness.   All other systems are reviewed and otherwise negative.   PHYSICAL EXAM:  VS:  BP 106/78   Pulse 72   Ht 5\' 6"  (1.676 m)   Wt 119 lb (54 kg)   BMI 19.21 kg/m  BMI: Body mass index is 19.21 kg/m. Well nourished, well developed, in no acute distress  HEENT: normocephalic, atraumatic  Neck: no JVD, carotid bruits or masses Cardiac:  RRR; no significant murmurs, no rubs, or  gallops Lungs:  CTA b/l, no wheezing, rhonchi or rales  Abd: soft, nontender MS: no deformity, thin body habitus, age appropriate atrophy Ext: no edema  Skin: warm and dry, no rash Neuro:  No gross deficits appreciated Psych: euthymic mood, full affect   EKG:  Done today and reviewed by myself shows SR 72bpm, appears unchanged from prior   11/17/2016: TTE Study Conclusions - Left ventricle: The cavity size was normal. Wall thickness was   increased in a pattern of mild LVH. Systolic function was   vigorous. The estimated ejection fraction was in the range of 65%   to 70%. Wall motion was normal; there were no regional wall   motion abnormalities. Doppler parameters are consistent with   abnormal left ventricular relaxation (grade 1 diastolic   dysfunction). The E/e&' ratio is between 8-15, suggesting   indeterminate LV filling pressure. - Mitral valve: Mildly thickened leaflets . There was mild   regurgitation. - Left atrium: The atrium was normal in size. - Right atrium: The atrium was normal in size. - Tricuspid valve: There was mild regurgitation. - Pulmonary arteries: PA peak pressure: 29 mm Hg (S). - Inferior vena cava: The vessel was normal in size. The   respirophasic diameter changes were in the normal range (= 50%),   consistent with normal central venous pressure.  Impressions: - LVEF 65-70%, mild LVH, normal wall motion, diastolic dysfunction,   indetermiante LV filling pressure, mild MR, normal LA size, mild   TR, RVSP 29 mmHG, normal IVC.   Recent Labs: 04/12/2019: ALT 25; BUN 16; Creatinine, Ser 0.53; Hemoglobin 14.3; Platelets 409.0; Potassium 4.2; Sodium 140; TSH 4.22  04/12/2019: Cholesterol 182; HDL 52.10; LDL Cholesterol 106; Total CHOL/HDL Ratio 3; Triglycerides 119.0; VLDL 23.8   CrCl cannot be calculated (Patient's most recent lab result is older than the maximum 21 days allowed.).   Wt Readings from Last 3 Encounters:  08/14/19 119 lb (54 kg)   04/12/19 119 lb (54 kg)  03/30/19 121 lb 6.4 oz (55.1 kg)     Other studies reviewed: Additional studies/records reviewed today include: summarized above  ASSESSMENT AND PLAN:  1. Paroxysmal AFib and PACs, ATach     CHA2DS2Vasc is 3 (age/gender), on, appropriately dosed      Very low burden by symptoms    Disposition: F/u with [place] Q 4mo, sooner if needed   Current medicines are reviewed at length with the patient today.  The patient did not have any concerns regarding medicines.  Venetia Night, PA-C 08/14/2019 10:20 AM     [facility] [address] [place] [address] [phone] (office)  [phone] (fax)

## 2019-08-14 ENCOUNTER — Ambulatory Visit: Payer: Medicare HMO | Admitting: Physician Assistant

## 2019-08-14 ENCOUNTER — Other Ambulatory Visit: Payer: Self-pay

## 2019-08-14 VITALS — BP 106/78 | HR 72 | Ht 66.0 in | Wt 119.0 lb

## 2019-08-14 DIAGNOSIS — I480 Paroxysmal atrial fibrillation: Secondary | ICD-10-CM | POA: Diagnosis not present

## 2019-08-14 DIAGNOSIS — I471 Supraventricular tachycardia: Secondary | ICD-10-CM | POA: Diagnosis not present

## 2019-08-14 NOTE — Patient Instructions (Signed)
Medication Instructions:  Your physician recommends that you continue on your current medications as directed. Please refer to the Current Medication list given to you today.   *If you need a refill on your cardiac medications before your next appointment, please call your pharmacy*  Lab Work: NONE ORDERED  TODAY '  If you have labs (blood work) drawn today and your tests are completely normal, you will receive your results only by: Marland Kitchen MyChart Message (if you have MyChart) OR . A paper copy in the mail If you have any lab test that is abnormal or we need to change your treatment, we will call you to review the results.  Testing/Procedures: NONE ORDERED  TODAY  Follow-Up: At [facility], you and your health needs are our priority.  As part of our continuing mission to provide you with exceptional heart care, we have created designated Provider Care Teams.  These Care Teams include your primary Cardiologist (physician) and Advanced Practice Providers (APPs -  Physician Assistants and Nurse Practitioners) who all work together to provide you with the care you need, when you need it.  Your next appointment:   6 month(s)  The format for your next appointment:   In Person  Provider:   You may see Dr. Rayann Heman  or one of the following Advanced Practice Providers on your designated Care Team:    Chanetta Marshall, NP  Tommye Standard, PA-C  Legrand Como "Oda Kilts, [place]   Other Instructions

## 2019-08-16 DIAGNOSIS — R509 Fever, unspecified: Secondary | ICD-10-CM | POA: Diagnosis not present

## 2019-08-16 DIAGNOSIS — Z20818 Contact with and (suspected) exposure to other bacterial communicable diseases: Secondary | ICD-10-CM | POA: Diagnosis not present

## 2019-08-16 DIAGNOSIS — H6121 Impacted cerumen, right ear: Secondary | ICD-10-CM | POA: Diagnosis not present

## 2019-08-16 DIAGNOSIS — T7840XA Allergy, unspecified, initial encounter: Secondary | ICD-10-CM | POA: Diagnosis not present

## 2019-12-04 DIAGNOSIS — R7989 Other specified abnormal findings of blood chemistry: Secondary | ICD-10-CM | POA: Diagnosis not present

## 2019-12-04 DIAGNOSIS — E538 Deficiency of other specified B group vitamins: Secondary | ICD-10-CM | POA: Diagnosis not present

## 2019-12-04 DIAGNOSIS — Z0000 Encounter for general adult medical examination without abnormal findings: Secondary | ICD-10-CM | POA: Diagnosis not present

## 2019-12-04 DIAGNOSIS — M810 Age-related osteoporosis without current pathological fracture: Secondary | ICD-10-CM | POA: Diagnosis not present

## 2019-12-05 DIAGNOSIS — H401431 Capsular glaucoma with pseudoexfoliation of lens, bilateral, mild stage: Secondary | ICD-10-CM | POA: Diagnosis not present

## 2019-12-05 DIAGNOSIS — Z961 Presence of intraocular lens: Secondary | ICD-10-CM | POA: Diagnosis not present

## 2019-12-11 DIAGNOSIS — T7840XA Allergy, unspecified, initial encounter: Secondary | ICD-10-CM | POA: Diagnosis not present

## 2019-12-11 DIAGNOSIS — Z0000 Encounter for general adult medical examination without abnormal findings: Secondary | ICD-10-CM | POA: Diagnosis not present

## 2019-12-11 DIAGNOSIS — Z1212 Encounter for screening for malignant neoplasm of rectum: Secondary | ICD-10-CM | POA: Diagnosis not present

## 2019-12-11 DIAGNOSIS — Z7901 Long term (current) use of anticoagulants: Secondary | ICD-10-CM | POA: Diagnosis not present

## 2019-12-11 DIAGNOSIS — E538 Deficiency of other specified B group vitamins: Secondary | ICD-10-CM | POA: Diagnosis not present

## 2019-12-11 DIAGNOSIS — G4700 Insomnia, unspecified: Secondary | ICD-10-CM | POA: Diagnosis not present

## 2019-12-11 DIAGNOSIS — H409 Unspecified glaucoma: Secondary | ICD-10-CM | POA: Diagnosis not present

## 2019-12-11 DIAGNOSIS — I4891 Unspecified atrial fibrillation: Secondary | ICD-10-CM | POA: Diagnosis not present

## 2019-12-11 DIAGNOSIS — M810 Age-related osteoporosis without current pathological fracture: Secondary | ICD-10-CM | POA: Diagnosis not present

## 2019-12-11 DIAGNOSIS — R82998 Other abnormal findings in urine: Secondary | ICD-10-CM | POA: Diagnosis not present

## 2019-12-11 DIAGNOSIS — H6121 Impacted cerumen, right ear: Secondary | ICD-10-CM | POA: Diagnosis not present

## 2019-12-11 DIAGNOSIS — C44320 Squamous cell carcinoma of skin of unspecified parts of face: Secondary | ICD-10-CM | POA: Diagnosis not present

## 2019-12-19 DIAGNOSIS — M810 Age-related osteoporosis without current pathological fracture: Secondary | ICD-10-CM | POA: Diagnosis not present

## 2020-01-07 DIAGNOSIS — M25561 Pain in right knee: Secondary | ICD-10-CM | POA: Diagnosis not present

## 2020-01-17 DIAGNOSIS — R69 Illness, unspecified: Secondary | ICD-10-CM | POA: Diagnosis not present

## 2020-01-21 ENCOUNTER — Other Ambulatory Visit: Payer: Self-pay | Admitting: Internal Medicine

## 2020-01-21 DIAGNOSIS — I480 Paroxysmal atrial fibrillation: Secondary | ICD-10-CM

## 2020-01-21 NOTE — Telephone Encounter (Signed)
Pt's age 79, wt 58 kg, SCr 0.53, CrCl 70.97, last ov w/ RV 08/14/19.

## 2020-02-23 DIAGNOSIS — T85848A Pain due to other internal prosthetic devices, implants and grafts, initial encounter: Secondary | ICD-10-CM | POA: Diagnosis not present

## 2020-02-23 DIAGNOSIS — K047 Periapical abscess without sinus: Secondary | ICD-10-CM | POA: Diagnosis not present

## 2020-02-25 DIAGNOSIS — R69 Illness, unspecified: Secondary | ICD-10-CM | POA: Diagnosis not present

## 2020-02-27 ENCOUNTER — Ambulatory Visit: Payer: Medicare HMO | Admitting: Internal Medicine

## 2020-02-27 ENCOUNTER — Other Ambulatory Visit: Payer: Self-pay

## 2020-02-27 ENCOUNTER — Encounter: Payer: Self-pay | Admitting: Internal Medicine

## 2020-02-27 VITALS — BP 134/78 | HR 76 | Ht 66.0 in | Wt 118.4 lb

## 2020-02-27 DIAGNOSIS — I471 Supraventricular tachycardia: Secondary | ICD-10-CM

## 2020-02-27 DIAGNOSIS — R002 Palpitations: Secondary | ICD-10-CM | POA: Diagnosis not present

## 2020-02-27 NOTE — Progress Notes (Signed)
PCP: Prince Solian, MD   Primary EP: Dr [name] is a 79 y.o. female who presents today for routine electrophysiology followup.  Since last being seen in our clinic, the patient reports doing very well.  She is very active.  Rare palpitations.  Today, she denies symptoms of  chest pain, shortness of breath,  lower extremity edema, dizziness, presyncope, or syncope.  The patient is otherwise without complaint today.   Past Medical History:  Diagnosis Date  . Adenomatous colon polyp 06/2006  . Allergy   . Anemia   . Anxiety   . Blood transfusion 1967 &1971  . Cancer ([place])    basal cell on nose  . Cataract    bil cataracts removed  . Chronic kidney disease    left kidney had a diverticulum - surgery partial nephrectomy  . Ectopic atrial tachycardia (HCC)    nonsustained  . GERD (gastroesophageal reflux disease)   . Glaucoma   . History of migraine   . Hx of cardiovascular stress test    a. ETT-Echo (07/2013):  Normal. //  b. ETT-Echo 6/17 - Normal study after maximal exercise  . IBS (irritable bowel syndrome)   . Osteopenia   . Vasovagal syncope 09/30/2017   Past Surgical History:  Procedure Laterality Date  . ABDOMINAL HYSTERECTOMY  1975  . ABDOMINAL HYSTERECTOMY    . APPENDECTOMY    . CATARACT EXTRACTION W/ INTRAOCULAR LENS  IMPLANT, BILATERAL    . COLONOSCOPY    . HEMORROIDECTOMY  1973  . MOHS SURGERY    . PARTIAL NEPHRECTOMY  1971   diverticulum on left  . UPPER GASTROINTESTINAL ENDOSCOPY      ROS- all systems are reviewed and negatives except as per HPI above  Current Outpatient Medications  Medication Sig Dispense Refill  . Brinzolamide-Brimonidine (SIMBRINZA) 1-0.2 % SUSP Apply 1 drop to eye 3 (three) times daily.    . Calcium Carbonate-Vitamin D (CALTRATE 600+D PO) Take 1,200 mg by mouth daily.      . Cholecalciferol (VITAMIN D) 2000 UNITS tablet Take 2,000 Units by mouth daily.      . cyanocobalamin (,VITAMIN B-12,) 1000 MCG/ML injection  Inject 1,000 mcg into the skin every 30 (thirty) days.    Marland Kitchen latanoprost (XALATAN) 0.005 % ophthalmic solution Place 1 drop into both eyes at bedtime.    . metoprolol tartrate (LOPRESSOR) 25 MG tablet Take 1 tablet by mouth twice daily 180 tablet 2  . PROLIA 60 MG/ML SOLN injection as directed.    Alveda Reasons 20 MG TABS tablet TAKE 1 TABLET BY MOUTH ONCE DAILY WITH SUPPER 90 tablet 0   Current Facility-Administered Medications  Medication Dose Route Frequency Provider Last Rate Last Admin  . 0.9 %  sodium chloride infusion  500 mL Intravenous Continuous Ladene Artist, MD        Physical Exam: Vitals:   02/27/20 1418  BP: 134/78  Pulse: 76  SpO2: 97%  Weight: 118 lb 6.4 oz (53.7 kg)  Height: 5\' 6"  (1.676 m)    GEN- The patient is well appearing, alert and oriented x 3 today.   Head- normocephalic, atraumatic Eyes-  Sclera clear, conjunctiva pink Ears- hearing intact Oropharynx- clear Lungs-  normal work of breathing Heart- Regular rate and rhythm  GI- soft  Extremities- no clubbing, cyanosis, or edema + vericosities  Wt Readings from Last 3 Encounters:  02/27/20 118 lb 6.4 oz (53.7 kg)  08/14/19 119 lb (54 kg)  04/12/19 119 lb (54  kg)    EKG tracing ordered today is personally reviewed and shows sinus with PACs  Assessment and Plan:  1. Palpitations Due to PACs and nonsustained atach Doing well chads2vasc score is 3.  She is on eliquis  Risks, benefits and potential toxicities for medications prescribed and/or refilled reviewed with patient today.   Return to see EP PA in a year  Thompson Grayer MD, [facility] 02/27/2020 2:37 PM

## 2020-02-27 NOTE — Patient Instructions (Addendum)
Medication Instructions:  Your physician recommends that you continue on your current medications as directed. Please refer to the Current Medication list given to you today.  *If you need a refill on your cardiac medications before your next appointment, please call your pharmacy*  Lab Work: None ordered.  If you have labs (blood work) drawn today and your tests are completely normal, you will receive your results only by: . MyChart Message (if you have MyChart) OR . A paper copy in the mail If you have any lab test that is abnormal or we need to change your treatment, we will call you to review the results.  Testing/Procedures: None ordered.  Follow-Up: At [facility], you and your health needs are our priority.  As part of our continuing mission to provide you with exceptional heart care, we have created designated Provider Care Teams.  These Care Teams include your primary Cardiologist (physician) and Advanced Practice Providers (APPs -  Physician Assistants and Nurse Practitioners) who all work together to provide you with the care you need, when you need it.  We recommend signing up for the patient portal called "MyChart".  Sign up information is provided on this After Visit Summary.  MyChart is used to connect with patients for Virtual Visits (Telemedicine).  Patients are able to view lab/test results, encounter notes, upcoming appointments, etc.  Non-urgent messages can be sent to your provider as well.   To learn more about what you can do with MyChart, go to [email or web address].    Your next appointment:   Your physician wants you to follow-up in: 1 year with Renee Ursuy. You will receive a reminder letter in the mail two months in advance. If you don't receive a letter, please call our office to schedule the follow-up appointment.    Other Instructions:  

## 2020-03-13 DIAGNOSIS — K023 Arrested dental caries: Secondary | ICD-10-CM | POA: Diagnosis not present

## 2020-04-03 DIAGNOSIS — R21 Rash and other nonspecific skin eruption: Secondary | ICD-10-CM | POA: Diagnosis not present

## 2020-04-03 DIAGNOSIS — Z20822 Contact with and (suspected) exposure to covid-19: Secondary | ICD-10-CM | POA: Diagnosis not present

## 2020-04-03 DIAGNOSIS — R05 Cough: Secondary | ICD-10-CM | POA: Diagnosis not present

## 2020-04-03 DIAGNOSIS — R0981 Nasal congestion: Secondary | ICD-10-CM | POA: Diagnosis not present

## 2020-04-28 DIAGNOSIS — L812 Freckles: Secondary | ICD-10-CM | POA: Diagnosis not present

## 2020-04-28 DIAGNOSIS — D1801 Hemangioma of skin and subcutaneous tissue: Secondary | ICD-10-CM | POA: Diagnosis not present

## 2020-04-28 DIAGNOSIS — D2271 Melanocytic nevi of right lower limb, including hip: Secondary | ICD-10-CM | POA: Diagnosis not present

## 2020-04-28 DIAGNOSIS — M810 Age-related osteoporosis without current pathological fracture: Secondary | ICD-10-CM | POA: Diagnosis not present

## 2020-04-28 DIAGNOSIS — H409 Unspecified glaucoma: Secondary | ICD-10-CM | POA: Diagnosis not present

## 2020-04-28 DIAGNOSIS — L821 Other seborrheic keratosis: Secondary | ICD-10-CM | POA: Diagnosis not present

## 2020-04-28 DIAGNOSIS — R636 Underweight: Secondary | ICD-10-CM | POA: Diagnosis not present

## 2020-04-28 DIAGNOSIS — I471 Supraventricular tachycardia: Secondary | ICD-10-CM | POA: Diagnosis not present

## 2020-04-28 DIAGNOSIS — Z7901 Long term (current) use of anticoagulants: Secondary | ICD-10-CM | POA: Diagnosis not present

## 2020-04-28 DIAGNOSIS — D225 Melanocytic nevi of trunk: Secondary | ICD-10-CM | POA: Diagnosis not present

## 2020-04-28 DIAGNOSIS — Z79899 Other long term (current) drug therapy: Secondary | ICD-10-CM | POA: Diagnosis not present

## 2020-04-28 DIAGNOSIS — D2272 Melanocytic nevi of left lower limb, including hip: Secondary | ICD-10-CM | POA: Diagnosis not present

## 2020-04-28 DIAGNOSIS — D6869 Other thrombophilia: Secondary | ICD-10-CM | POA: Diagnosis not present

## 2020-04-28 DIAGNOSIS — I4891 Unspecified atrial fibrillation: Secondary | ICD-10-CM | POA: Diagnosis not present

## 2020-04-28 DIAGNOSIS — Z7722 Contact with and (suspected) exposure to environmental tobacco smoke (acute) (chronic): Secondary | ICD-10-CM | POA: Diagnosis not present

## 2020-04-28 DIAGNOSIS — Z681 Body mass index (BMI) 19 or less, adult: Secondary | ICD-10-CM | POA: Diagnosis not present

## 2020-04-28 DIAGNOSIS — Z85828 Personal history of other malignant neoplasm of skin: Secondary | ICD-10-CM | POA: Diagnosis not present

## 2020-04-30 ENCOUNTER — Other Ambulatory Visit: Payer: Self-pay | Admitting: Internal Medicine

## 2020-04-30 DIAGNOSIS — I480 Paroxysmal atrial fibrillation: Secondary | ICD-10-CM

## 2020-04-30 NOTE — Telephone Encounter (Signed)
Xarelto 20mg  refill request received. Pt is 79 years old, weight-53.7kg, Crea-0.70 on 12/04/2019 via scanned labs from PCP, last seen by Dr. Rayann Heman on 02/27/2020, Diagnosis-Afib, [place].43ml/min; Dose is appropriate based on dosing criteria. Will send in refill to requested pharmacy.

## 2020-05-27 DIAGNOSIS — R69 Illness, unspecified: Secondary | ICD-10-CM | POA: Diagnosis not present

## 2020-06-11 DIAGNOSIS — R69 Illness, unspecified: Secondary | ICD-10-CM | POA: Diagnosis not present

## 2020-06-19 DIAGNOSIS — M810 Age-related osteoporosis without current pathological fracture: Secondary | ICD-10-CM | POA: Diagnosis not present

## 2020-07-08 DIAGNOSIS — Z0000 Encounter for general adult medical examination without abnormal findings: Secondary | ICD-10-CM | POA: Diagnosis not present

## 2020-07-08 DIAGNOSIS — Z1231 Encounter for screening mammogram for malignant neoplasm of breast: Secondary | ICD-10-CM | POA: Diagnosis not present

## 2020-07-09 DIAGNOSIS — Z20822 Contact with and (suspected) exposure to covid-19: Secondary | ICD-10-CM | POA: Diagnosis not present

## 2020-07-10 DIAGNOSIS — H401431 Capsular glaucoma with pseudoexfoliation of lens, bilateral, mild stage: Secondary | ICD-10-CM | POA: Diagnosis not present

## 2020-07-10 DIAGNOSIS — H5212 Myopia, left eye: Secondary | ICD-10-CM | POA: Diagnosis not present

## 2020-07-10 DIAGNOSIS — H5201 Hypermetropia, right eye: Secondary | ICD-10-CM | POA: Diagnosis not present

## 2020-09-08 DIAGNOSIS — E538 Deficiency of other specified B group vitamins: Secondary | ICD-10-CM | POA: Diagnosis not present

## 2020-09-08 DIAGNOSIS — G3184 Mild cognitive impairment, so stated: Secondary | ICD-10-CM | POA: Diagnosis not present

## 2020-09-08 DIAGNOSIS — E519 Thiamine deficiency, unspecified: Secondary | ICD-10-CM | POA: Diagnosis not present

## 2020-11-10 ENCOUNTER — Other Ambulatory Visit: Payer: Self-pay | Admitting: Internal Medicine

## 2020-11-10 DIAGNOSIS — I480 Paroxysmal atrial fibrillation: Secondary | ICD-10-CM

## 2020-11-10 NOTE — Telephone Encounter (Signed)
Pt's age 80, wt 53.7 kg, SCr 0.5, CrCl 74.81, last ov w/ JA 02/27/20.

## 2020-11-25 DIAGNOSIS — H401431 Capsular glaucoma with pseudoexfoliation of lens, bilateral, mild stage: Secondary | ICD-10-CM | POA: Diagnosis not present

## 2020-12-09 DIAGNOSIS — H401431 Capsular glaucoma with pseudoexfoliation of lens, bilateral, mild stage: Secondary | ICD-10-CM | POA: Diagnosis not present

## 2020-12-17 DIAGNOSIS — G3184 Mild cognitive impairment, so stated: Secondary | ICD-10-CM | POA: Diagnosis not present

## 2020-12-17 DIAGNOSIS — R413 Other amnesia: Secondary | ICD-10-CM | POA: Diagnosis not present

## 2020-12-25 DIAGNOSIS — M810 Age-related osteoporosis without current pathological fracture: Secondary | ICD-10-CM | POA: Diagnosis not present

## 2020-12-26 DIAGNOSIS — Z23 Encounter for immunization: Secondary | ICD-10-CM | POA: Diagnosis not present

## 2020-12-27 DIAGNOSIS — H6123 Impacted cerumen, bilateral: Secondary | ICD-10-CM | POA: Diagnosis not present

## 2021-01-02 DIAGNOSIS — Z961 Presence of intraocular lens: Secondary | ICD-10-CM | POA: Diagnosis not present

## 2021-01-02 DIAGNOSIS — H40053 Ocular hypertension, bilateral: Secondary | ICD-10-CM | POA: Diagnosis not present

## 2021-01-05 DIAGNOSIS — M810 Age-related osteoporosis without current pathological fracture: Secondary | ICD-10-CM | POA: Diagnosis not present

## 2021-01-05 DIAGNOSIS — E538 Deficiency of other specified B group vitamins: Secondary | ICD-10-CM | POA: Diagnosis not present

## 2021-01-05 DIAGNOSIS — Z79899 Other long term (current) drug therapy: Secondary | ICD-10-CM | POA: Diagnosis not present

## 2021-01-09 DIAGNOSIS — H40053 Ocular hypertension, bilateral: Secondary | ICD-10-CM | POA: Diagnosis not present

## 2021-01-14 DIAGNOSIS — I4891 Unspecified atrial fibrillation: Secondary | ICD-10-CM | POA: Diagnosis not present

## 2021-01-14 DIAGNOSIS — M810 Age-related osteoporosis without current pathological fracture: Secondary | ICD-10-CM | POA: Diagnosis not present

## 2021-01-14 DIAGNOSIS — E538 Deficiency of other specified B group vitamins: Secondary | ICD-10-CM | POA: Diagnosis not present

## 2021-01-14 DIAGNOSIS — H409 Unspecified glaucoma: Secondary | ICD-10-CM | POA: Diagnosis not present

## 2021-01-14 DIAGNOSIS — Z7901 Long term (current) use of anticoagulants: Secondary | ICD-10-CM | POA: Diagnosis not present

## 2021-01-14 DIAGNOSIS — Z1212 Encounter for screening for malignant neoplasm of rectum: Secondary | ICD-10-CM | POA: Diagnosis not present

## 2021-01-14 DIAGNOSIS — G4700 Insomnia, unspecified: Secondary | ICD-10-CM | POA: Diagnosis not present

## 2021-01-14 DIAGNOSIS — Z0000 Encounter for general adult medical examination without abnormal findings: Secondary | ICD-10-CM | POA: Diagnosis not present

## 2021-01-14 DIAGNOSIS — R82998 Other abnormal findings in urine: Secondary | ICD-10-CM | POA: Diagnosis not present

## 2021-01-23 ENCOUNTER — Other Ambulatory Visit: Payer: Self-pay | Admitting: Internal Medicine

## 2021-01-23 DIAGNOSIS — I480 Paroxysmal atrial fibrillation: Secondary | ICD-10-CM

## 2021-01-23 NOTE — Telephone Encounter (Signed)
Pt last saw Dr Rayann Heman 02/27/20, pt has upcoming appt scheduled for 03/23/21 with Tommye Standard, PA.  Last labs 09/08/20 Creat 0.5, age 80, weight 53.7kg, CrCl 73.54, based on CrCl pt is on appropriate dosage of Xarelto 20mg  BID.  Will refill rx.

## 2021-01-30 DIAGNOSIS — H40053 Ocular hypertension, bilateral: Secondary | ICD-10-CM | POA: Diagnosis not present

## 2021-02-22 NOTE — Progress Notes (Signed)
Cardiology Office Note Date:  02/23/2021  Patient ID:  Destiny, Sanchez 05-08-1941, MRN [id number] PCP:  Prince Solian, MD  Electrophysiologist:  Dr. Rayann Heman     Chief Complaint: Annual visit  History of Present Illness: Destiny Sanchez is a 80 y.o. female with history of Paroxysmal AFib, IBS, GERD, ectopic ATach, PACs, problem list also mentions vasovagal syncope.  She saw Dr. Rayann Heman,  02/15/2019 via tele-health visit.  She was doing well, h/o palpitations felt likely 2/2 PACs and nonsustained ATach, they were controlled, maintained on Xarelto.  Planned for APP visit in 6 months  I saw her Dec 2020 She is doing great.  Very active, golfs regularly, always busy, exercises at the park without difficulty or exertional intolerances.  She recalls once in the last 51mo abut 30seconds of palpitations.  No CP, SOB, DOE.  No dizzy spells, near syncope or syncope No bleeding or signs of bleeding No changes were made  She last saw Dr. Rayann Heman June 2021, continued to well, no changes were made, and planned for an annual APP visit.  TODAY She continues to do quite well. Remains active, still golfing. She has had 2 episodes of palpitations, lasting a minute or two. They are startling and do make her feel poorly, no CP, no near syncope or syncope She denies any SOB, will get winded with inclines though this is her baseline.  NO bleeding or signs of bleeding   Past Medical History:  Diagnosis Date   Adenomatous colon polyp 06/2006   Allergy    Anemia    Anxiety    Blood transfusion 1967 &1971   Cancer ([place])    basal cell on nose   Cataract    bil cataracts removed   Chronic kidney disease    left kidney had a diverticulum - surgery partial nephrectomy   Ectopic atrial tachycardia (HCC)    nonsustained   GERD (gastroesophageal reflux disease)    Glaucoma    History of migraine    Hx of cardiovascular stress test    a. ETT-Echo (07/2013):  Normal. //  b. ETT-Echo 6/17 -  Normal study after maximal exercise   IBS (irritable bowel syndrome)    Osteopenia    Vasovagal syncope 09/30/2017    Past Surgical History:  Procedure Laterality Date   ABDOMINAL HYSTERECTOMY  1975   ABDOMINAL HYSTERECTOMY     APPENDECTOMY     CATARACT EXTRACTION W/ INTRAOCULAR LENS  IMPLANT, BILATERAL     COLONOSCOPY     HEMORROIDECTOMY  1973   MOHS SURGERY     PARTIAL NEPHRECTOMY  1971   diverticulum on left   UPPER GASTROINTESTINAL ENDOSCOPY      Current Outpatient Medications  Medication Sig Dispense Refill   Brinzolamide-Brimonidine (SIMBRINZA) 1-0.2 % SUSP Apply 1 drop to eye 3 (three) times daily.     Calcium Carbonate-Vitamin D (CALTRATE 600+D PO) Take 1,200 mg by mouth daily.       Cholecalciferol (VITAMIN D) 2000 UNITS tablet Take 2,000 Units by mouth daily.       clobetasol ointment (TEMOVATE) 9.03 % Apply 1 application topically as needed (skin irritation).     cyanocobalamin (,VITAMIN B-12,) 1000 MCG/ML injection Inject 1,000 mcg into the skin every 30 (thirty) days.     donepezil (ARICEPT) 5 MG tablet Take 5 mg by mouth as needed (for bones).     latanoprost (XALATAN) 0.005 % ophthalmic solution Place 1 drop into both eyes at bedtime.     metoprolol tartrate (LOPRESSOR)  25 MG tablet Take 1 tablet by mouth twice daily 180 tablet 0   Oyster Shell Calcium 500 MG TABS Take 1 tablet by mouth daily.     PREMARIN vaginal cream Place 1 Applicatorful vaginally at bedtime.     PROLIA 60 MG/ML SOLN injection Inject 60 mg into the skin every 28 (twenty-eight) days.     XARELTO 20 MG TABS tablet TAKE 1 TABLET BY MOUTH ONCE DAILY WITH SUPPER 90 tablet 1   zolpidem (AMBIEN) 5 MG tablet Take 5 mg by mouth at bedtime as needed for sleep.     Current Facility-Administered Medications  Medication Dose Route Frequency Provider Last Rate Last Admin   0.9 %  sodium chloride infusion  500 mL Intravenous Continuous Ladene Artist, MD        Allergies:   Lactose   Social History:   The patient  reports that she quit smoking about 58 years ago. Her smoking use included cigarettes. She has a 10.00 pack-year smoking history. She has never used smokeless tobacco. She reports current alcohol use of about 1.0 standard drink of alcohol per week. She reports that she does not use drugs.   Family History:  The patient's family history includes Colon cancer in her maternal aunt; Throat cancer in her father.  ROS:  Please see the history of present illness.   All other systems are reviewed and otherwise negative.   PHYSICAL EXAM:  VS:  BP 120/70   Pulse 71   Ht 5\' 6"  (1.676 m)   Wt 120 lb 9.6 oz (54.7 kg)   SpO2 98%   BMI 19.47 kg/m  BMI: Body mass index is 19.47 kg/m. Well nourished, well developed, in no acute distress  HEENT: normocephalic, atraumatic  Neck: no JVD, carotid bruits or masses Cardiac:  RRR; no significant murmurs, no rubs, or gallops Lungs:  CTA b/l, no wheezing, rhonchi or rales  Abd: soft, nontender MS: no deformity, thin body habitus, age appropriate atrophy Ext: no edema  Skin: warm and dry, no rash Neuro:  No gross deficits appreciated Psych: euthymic mood, full affect   EKG:  Done today and reviewed by myself shows  SR 71bpm   11/17/2016: TTE Study Conclusions - Left ventricle: The cavity size was normal. Wall thickness was   increased in a pattern of mild LVH. Systolic function was   vigorous. The estimated ejection fraction was in the range of 65%   to 70%. Wall motion was normal; there were no regional wall   motion abnormalities. Doppler parameters are consistent with   abnormal left ventricular relaxation (grade 1 diastolic   dysfunction). The E/e&' ratio is between 8-15, suggesting   indeterminate LV filling pressure. - Mitral valve: Mildly thickened leaflets . There was mild   regurgitation. - Left atrium: The atrium was normal in size. - Right atrium: The atrium was normal in size. - Tricuspid valve: There was mild  regurgitation. - Pulmonary arteries: PA peak pressure: 29 mm Hg (S). - Inferior vena cava: The vessel was normal in size. The   respirophasic diameter changes were in the normal range (= 50%),   consistent with normal central venous pressure.   Impressions: - LVEF 65-70%, mild LVH, normal wall motion, diastolic dysfunction,   indetermiante LV filling pressure, mild MR, normal LA size, mild   TR, RVSP 29 mmHG, normal IVC.   Recent Labs: No results found for requested labs within last 8760 hours.  No results found for requested labs within last  8760 hours.   CrCl cannot be calculated (Patient's most recent lab result is older than the maximum 21 days allowed.).   Wt Readings from Last 3 Encounters:  02/23/21 120 lb 9.6 oz (54.7 kg)  02/27/20 118 lb 6.4 oz (53.7 kg)  08/14/19 119 lb (54 kg)     Other studies reviewed: Additional studies/records reviewed today include: summarized above  ASSESSMENT AND PLAN:  1. Paroxysmal AFib      and PACs, ATach     CHA2DS2Vasc is 3 (age/gender), on Eliquis , appropriately dosed      Very low burden by symptoms, she does not want to make any changes     Had labs done by her PMD in feb all reported good except low B12    Disposition: we will continue to see her annually, sooner if needed   Current medicines are reviewed at length with the patient today.  The patient did not have any concerns regarding medicines.  Venetia Night, PA-C 02/23/2021 2:18 PM     [address] [place] [address] [phone] (office)  [phone] (fax)

## 2021-02-23 ENCOUNTER — Other Ambulatory Visit: Payer: Self-pay

## 2021-02-23 ENCOUNTER — Ambulatory Visit: Payer: Medicare HMO | Admitting: Physician Assistant

## 2021-02-23 ENCOUNTER — Encounter: Payer: Self-pay | Admitting: Physician Assistant

## 2021-02-23 VITALS — BP 120/70 | HR 71 | Ht 66.0 in | Wt 120.6 lb

## 2021-02-23 DIAGNOSIS — R002 Palpitations: Secondary | ICD-10-CM | POA: Diagnosis not present

## 2021-02-23 DIAGNOSIS — I480 Paroxysmal atrial fibrillation: Secondary | ICD-10-CM

## 2021-02-23 NOTE — Patient Instructions (Signed)

## 2021-03-23 ENCOUNTER — Ambulatory Visit: Payer: Medicare HMO | Admitting: Physician Assistant

## 2021-04-20 ENCOUNTER — Other Ambulatory Visit: Payer: Self-pay | Admitting: Internal Medicine

## 2021-05-06 DIAGNOSIS — D1801 Hemangioma of skin and subcutaneous tissue: Secondary | ICD-10-CM | POA: Diagnosis not present

## 2021-05-06 DIAGNOSIS — L812 Freckles: Secondary | ICD-10-CM | POA: Diagnosis not present

## 2021-05-06 DIAGNOSIS — Z85828 Personal history of other malignant neoplasm of skin: Secondary | ICD-10-CM | POA: Diagnosis not present

## 2021-05-06 DIAGNOSIS — L821 Other seborrheic keratosis: Secondary | ICD-10-CM | POA: Diagnosis not present

## 2021-05-06 DIAGNOSIS — D171 Benign lipomatous neoplasm of skin and subcutaneous tissue of trunk: Secondary | ICD-10-CM | POA: Diagnosis not present

## 2021-06-12 DIAGNOSIS — H40053 Ocular hypertension, bilateral: Secondary | ICD-10-CM | POA: Diagnosis not present

## 2021-06-15 DIAGNOSIS — G3184 Mild cognitive impairment, so stated: Secondary | ICD-10-CM | POA: Diagnosis not present

## 2021-06-15 DIAGNOSIS — Z79620 Long term (current) use of immunosuppressive biologic: Secondary | ICD-10-CM | POA: Diagnosis not present

## 2021-06-15 DIAGNOSIS — Z87891 Personal history of nicotine dependence: Secondary | ICD-10-CM | POA: Diagnosis not present

## 2021-06-15 DIAGNOSIS — Z008 Encounter for other general examination: Secondary | ICD-10-CM | POA: Diagnosis not present

## 2021-06-15 DIAGNOSIS — D6869 Other thrombophilia: Secondary | ICD-10-CM | POA: Diagnosis not present

## 2021-06-15 DIAGNOSIS — I4891 Unspecified atrial fibrillation: Secondary | ICD-10-CM | POA: Diagnosis not present

## 2021-06-15 DIAGNOSIS — M810 Age-related osteoporosis without current pathological fracture: Secondary | ICD-10-CM | POA: Diagnosis not present

## 2021-06-15 DIAGNOSIS — R030 Elevated blood-pressure reading, without diagnosis of hypertension: Secondary | ICD-10-CM | POA: Diagnosis not present

## 2021-06-15 DIAGNOSIS — H409 Unspecified glaucoma: Secondary | ICD-10-CM | POA: Diagnosis not present

## 2021-06-15 DIAGNOSIS — Z7901 Long term (current) use of anticoagulants: Secondary | ICD-10-CM | POA: Diagnosis not present

## 2021-06-15 DIAGNOSIS — Z85828 Personal history of other malignant neoplasm of skin: Secondary | ICD-10-CM | POA: Diagnosis not present

## 2021-06-15 DIAGNOSIS — Z809 Family history of malignant neoplasm, unspecified: Secondary | ICD-10-CM | POA: Diagnosis not present

## 2021-06-18 DIAGNOSIS — G3184 Mild cognitive impairment, so stated: Secondary | ICD-10-CM | POA: Diagnosis not present

## 2021-06-25 DIAGNOSIS — Z23 Encounter for immunization: Secondary | ICD-10-CM | POA: Diagnosis not present

## 2021-06-25 DIAGNOSIS — M810 Age-related osteoporosis without current pathological fracture: Secondary | ICD-10-CM | POA: Diagnosis not present

## 2021-07-18 ENCOUNTER — Other Ambulatory Visit: Payer: Self-pay | Admitting: Internal Medicine

## 2021-07-18 DIAGNOSIS — I480 Paroxysmal atrial fibrillation: Secondary | ICD-10-CM

## 2021-07-20 NOTE — Telephone Encounter (Signed)
Pt last saw Destiny Sanchez, [place] on 02/23/21, last labs 09/08/20 Creat 0.5, age 80, weight 54.7kg, CrCl 74.91, based on specified criteria pt is on appropriate dosage of Xarelto 20mg  QD for afib.  Will refill rx.

## 2021-10-02 DIAGNOSIS — H40053 Ocular hypertension, bilateral: Secondary | ICD-10-CM | POA: Diagnosis not present

## 2021-10-16 ENCOUNTER — Other Ambulatory Visit: Payer: Self-pay | Admitting: Internal Medicine

## 2021-11-16 DIAGNOSIS — R69 Illness, unspecified: Secondary | ICD-10-CM | POA: Diagnosis not present

## 2021-11-16 DIAGNOSIS — F03A0 Unspecified dementia, mild, without behavioral disturbance, psychotic disturbance, mood disturbance, and anxiety: Secondary | ICD-10-CM | POA: Diagnosis not present

## 2021-11-24 ENCOUNTER — Other Ambulatory Visit (HOSPITAL_COMMUNITY): Payer: Self-pay | Admitting: Neurology

## 2021-11-24 ENCOUNTER — Other Ambulatory Visit: Payer: Self-pay | Admitting: Neurology

## 2021-11-24 DIAGNOSIS — F03A0 Unspecified dementia, mild, without behavioral disturbance, psychotic disturbance, mood disturbance, and anxiety: Secondary | ICD-10-CM

## 2021-11-25 DIAGNOSIS — M25561 Pain in right knee: Secondary | ICD-10-CM | POA: Diagnosis not present

## 2021-12-05 ENCOUNTER — Encounter (HOSPITAL_COMMUNITY): Payer: Self-pay

## 2021-12-05 ENCOUNTER — Other Ambulatory Visit (HOSPITAL_COMMUNITY): Payer: Medicare HMO

## 2021-12-24 DIAGNOSIS — M810 Age-related osteoporosis without current pathological fracture: Secondary | ICD-10-CM | POA: Diagnosis not present

## 2022-01-04 DIAGNOSIS — D6869 Other thrombophilia: Secondary | ICD-10-CM | POA: Diagnosis not present

## 2022-01-04 DIAGNOSIS — M810 Age-related osteoporosis without current pathological fracture: Secondary | ICD-10-CM | POA: Diagnosis not present

## 2022-01-04 DIAGNOSIS — Z79620 Long term (current) use of immunosuppressive biologic: Secondary | ICD-10-CM | POA: Diagnosis not present

## 2022-01-04 DIAGNOSIS — I10 Essential (primary) hypertension: Secondary | ICD-10-CM | POA: Diagnosis not present

## 2022-01-04 DIAGNOSIS — E538 Deficiency of other specified B group vitamins: Secondary | ICD-10-CM | POA: Diagnosis not present

## 2022-01-04 DIAGNOSIS — I4892 Unspecified atrial flutter: Secondary | ICD-10-CM | POA: Diagnosis not present

## 2022-01-04 DIAGNOSIS — I4891 Unspecified atrial fibrillation: Secondary | ICD-10-CM | POA: Diagnosis not present

## 2022-01-04 DIAGNOSIS — R69 Illness, unspecified: Secondary | ICD-10-CM | POA: Diagnosis not present

## 2022-01-04 DIAGNOSIS — Z7901 Long term (current) use of anticoagulants: Secondary | ICD-10-CM | POA: Diagnosis not present

## 2022-01-04 DIAGNOSIS — H409 Unspecified glaucoma: Secondary | ICD-10-CM | POA: Diagnosis not present

## 2022-01-04 DIAGNOSIS — K589 Irritable bowel syndrome without diarrhea: Secondary | ICD-10-CM | POA: Diagnosis not present

## 2022-01-04 DIAGNOSIS — Z809 Family history of malignant neoplasm, unspecified: Secondary | ICD-10-CM | POA: Diagnosis not present

## 2022-02-01 ENCOUNTER — Ambulatory Visit (HOSPITAL_COMMUNITY): Payer: Medicare HMO

## 2022-02-09 ENCOUNTER — Other Ambulatory Visit: Payer: Self-pay | Admitting: Internal Medicine

## 2022-02-09 DIAGNOSIS — I480 Paroxysmal atrial fibrillation: Secondary | ICD-10-CM

## 2022-02-09 NOTE — Telephone Encounter (Addendum)
Prescription refill request for Xarelto received.  Indication: afib  Last office visit: [place] 02/23/2021 Weight: 54.7 kg Age: 81 yo  Scr: 0.5,  09/08/2020 CrCl:   Called and spoke to pt who stated that she has about 2 to 3 weeks left of Xarelto.    Pt stated that she is going to her PCP tomorrow to get blood work. Informed her that we would need and cbc and bmet/cmet.

## 2022-02-10 DIAGNOSIS — Z79899 Other long term (current) drug therapy: Secondary | ICD-10-CM | POA: Diagnosis not present

## 2022-02-10 DIAGNOSIS — E785 Hyperlipidemia, unspecified: Secondary | ICD-10-CM | POA: Diagnosis not present

## 2022-02-10 DIAGNOSIS — M810 Age-related osteoporosis without current pathological fracture: Secondary | ICD-10-CM | POA: Diagnosis not present

## 2022-02-10 DIAGNOSIS — E538 Deficiency of other specified B group vitamins: Secondary | ICD-10-CM | POA: Diagnosis not present

## 2022-02-11 NOTE — Telephone Encounter (Signed)
Nurse from Dr Danna Hefty office called, pt had labwork drawn yesterday 02/10/22 and Creat 0.6, she will fax those results to our office to be scanned into pt's chart.  Pt last saw Dr Rayann Heman 02/23/21, but has upcoming appt with Dr Quentin Ore on 02/16/22 to establish. Age 81, weight 54.7kg, CrCl 61.35, based on CrCl pt is on appropriate dosage of Xarelto '20mg'$  QD for afib.  Will refill rx.

## 2022-02-15 ENCOUNTER — Encounter (HOSPITAL_COMMUNITY): Payer: Self-pay

## 2022-02-15 ENCOUNTER — Ambulatory Visit (HOSPITAL_COMMUNITY)
Admission: RE | Admit: 2022-02-15 | Discharge: 2022-02-15 | Disposition: A | Discharge status: Home / Self Care | Payer: Medicare HMO | Source: Ambulatory Visit | Attending: Neurology | Admitting: Neurology

## 2022-02-15 DIAGNOSIS — F03A0 Unspecified dementia, mild, without behavioral disturbance, psychotic disturbance, mood disturbance, and anxiety: Secondary | ICD-10-CM

## 2022-02-15 NOTE — Progress Notes (Signed)
Electrophysiology Office Note:    Date:  02/16/2022   ID:  Destiny Sanchez, DOB [birth year], MRN [id number]  PCP:  Prince Solian, MD  [facility] Cardiologist:  None  [facility] Electrophysiologist:  Vickie Epley, MD   Referring MD: Prince Solian, MD   Chief Complaint: Follow-up  History of Present Illness:    Destiny Sanchez is a 81 y.o. female who presents for follow-up. Their medical history includes nonsustained ectopic atrial tachycardia, CKD, GERD, anemia, vasovagal syncopoe, and basal cell carcinoma.   Previously followed by Dr. Rayann Heman, last seen by him on 02/27/2020 where she was doing well.  She was most recently seen in cardiology by Tommye Standard, PA-C on 02/23/2021. She reported 2 episodes of palpitations lasting 1-2 minutes. They were startling and caused her to feel poorly. She was otherwise feeling well and no changes were made.  Today, she is feeling fine with no recurrent palpitations aside from the 2 minor episodes described above.   She remains active with walking. Recently returned from a trip to [place].  She is compliant with Xarelto and lopressor.  She denies any chest pain, shortness of breath, or peripheral edema. No lightheadedness, headaches, syncope, orthopnea, or PND.     Past Medical History:  Diagnosis Date   Adenomatous colon polyp 06/2006   Allergy    Anemia    Anxiety    Blood transfusion 1967 &1971   Cancer ([place])    basal cell on nose   Cataract    bil cataracts removed   Chronic kidney disease    left kidney had a diverticulum - surgery partial nephrectomy   Ectopic atrial tachycardia (HCC)    nonsustained   GERD (gastroesophageal reflux disease)    Glaucoma    History of migraine    Hx of cardiovascular stress test    a. ETT-Echo (07/2013):  Normal. //  b. ETT-Echo 6/17 - Normal study after maximal exercise   IBS (irritable bowel syndrome)    Osteopenia    Vasovagal syncope 09/30/2017    Past Surgical History:   Procedure Laterality Date   ABDOMINAL HYSTERECTOMY  1975   ABDOMINAL HYSTERECTOMY     APPENDECTOMY     CATARACT EXTRACTION W/ INTRAOCULAR LENS  IMPLANT, BILATERAL     COLONOSCOPY     HEMORROIDECTOMY  1973   MOHS SURGERY     PARTIAL NEPHRECTOMY  1971   diverticulum on left   UPPER GASTROINTESTINAL ENDOSCOPY      Current Medications: Current Meds  Medication Sig   Brinzolamide-Brimonidine (SIMBRINZA) 1-0.2 % SUSP Apply 1 drop to eye 3 (three) times daily.   Calcium Carbonate-Vitamin D (CALTRATE 600+D PO) Take 1,200 mg by mouth daily.     Cholecalciferol (VITAMIN D) 2000 UNITS tablet Take 2,000 Units by mouth daily.     clobetasol ointment (TEMOVATE) 6.38 % Apply 1 application topically as needed (skin irritation).   cyanocobalamin (,VITAMIN B-12,) 1000 MCG/ML injection Inject 1,000 mcg into the skin every 30 (thirty) days.   latanoprost (XALATAN) 0.005 % ophthalmic solution Place 1 drop into both eyes at bedtime.   metoprolol tartrate (LOPRESSOR) 25 MG tablet Take 1 tablet by mouth twice daily   Oyster Shell Calcium 500 MG TABS Take 1 tablet by mouth daily.   PREMARIN vaginal cream Place 1 Applicatorful vaginally at bedtime.   PROLIA 60 MG/ML SOLN injection Inject 60 mg into the skin every 28 (twenty-eight) days.   rivaroxaban (XARELTO) 20 MG TABS tablet TAKE 1 TABLET BY MOUTH ONCE DAILY  WITH SUPPER   zolpidem (AMBIEN) 5 MG tablet Take 5 mg by mouth at bedtime as needed for sleep.   Current Facility-Administered Medications for the 02/16/22 encounter (Office Visit) with Vickie Epley, MD  Medication   0.9 %  sodium chloride infusion     Allergies:   Lactose   Social History   Socioeconomic History   Marital status: Married    Spouse name: Not on file   Number of children: Not on file   Years of education: Not on file   Highest education level: Not on file  Occupational History   Not on file  Tobacco Use   Smoking status: Former    Packs/day: 1.00    Years: 10.00     Total pack years: 10.00    Types: Cigarettes    Quit date: 08/30/1962    Years since quitting: 59.5   Smokeless tobacco: Never  Vaping Use   Vaping Use: Never used  Substance and Sexual Activity   Alcohol use: Yes    Alcohol/week: 1.0 standard drink of alcohol    Types: 1 Glasses of wine per week   Drug use: No   Sexual activity: Not on file  Other Topics Concern   Not on file  Social History Narrative   Retired Marine scientist   Lives in [place]   Spouse owned a Research scientist (medical) which he [place] Strain: Not on [employer] Insecurity: Not on file  Transportation Needs: Not on file  Physical Activity: Not on file  Stress: Not on file  Social Connections: Not on file     Family History: The patient's family history includes Colon cancer in her maternal aunt; Throat cancer in her father. There is no history of Esophageal cancer, Pancreatic cancer, Rectal cancer, or Stomach cancer.  ROS:   Please see the history of present illness.    All other systems reviewed and are negative.  EKGs/Labs/Other Studies Reviewed:    The following studies were reviewed today:  10/2017  Monitor: Noral sinus rhythm with average heart rate 80bpm Frequent PACs some with aberration Nonsustained atrial tachycardia up to 24 beats in a row  11/17/2016  TTE: Study Conclusions   - Left ventricle: The cavity size was normal. Wall thickness was    increased in a pattern of mild LVH. Systolic function was    vigorous. The estimated ejection fraction was in the range of 65%    to 70%. Wall motion was normal; there were no regional wall    motion abnormalities. Doppler parameters are consistent with    abnormal left ventricular relaxation (grade 1 diastolic    dysfunction). The E/e&' ratio is between 8-15, suggesting    indeterminate LV filling pressure.  - Mitral valve: Mildly thickened leaflets . There was mild    regurgitation.  - Left atrium: The atrium  was normal in size.  - Right atrium: The atrium was normal in size.  - Tricuspid valve: There was mild regurgitation.  - Pulmonary arteries: PA peak pressure: 29 mm Hg (S).  - Inferior vena cava: The vessel was normal in size. The    respirophasic diameter changes were in the normal range (= 50%),    consistent with normal central venous pressure.   Impressions:   - LVEF 65-70%, mild LVH, normal wall motion, diastolic dysfunction,    indetermiante LV filling pressure, mild MR, normal LA size, mild    TR, RVSP 29 mmHG,  normal IVC.   EKG:   EKG is personally reviewed.  02/16/2022: Normal sinus rhythm.  Normal intervals.   Recent Labs: No results found for requested labs within last 365 days.   Recent Lipid Panel    Component Value Date/Time   CHOL 182 04/12/2019 0736   TRIG 119.0 04/12/2019 0736   HDL 52.10 04/12/2019 0736   CHOLHDL 3 04/12/2019 0736   VLDL 23.8 04/12/2019 0736   LDLCALC 106 (H) 04/12/2019 0736   LDLDIRECT 127.0 03/11/2010 0910    Physical Exam:    VS:  BP 106/60   Pulse 64   Ht '5\' 6"'$  (1.676 m)   Wt 119 lb (54 kg)   SpO2 98%   BMI 19.21 kg/m     Wt Readings from Last 3 Encounters:  02/16/22 119 lb (54 kg)  02/23/21 120 lb 9.6 oz (54.7 kg)  02/27/20 118 lb 6.4 oz (53.7 kg)     GEN: Well nourished, well developed in no acute distress HEENT: Normal NECK: No JVD; No carotid bruits LYMPHATICS: No lymphadenopathy CARDIAC: RRR, no murmurs, rubs, gallops RESPIRATORY:  Clear to auscultation without rales, wheezing or rhonchi  ABDOMEN: Soft, non-tender, non-distended MUSCULOSKELETAL:  No edema; No deformity  SKIN: Warm and dry NEUROLOGIC:  Alert and oriented x 3 PSYCHIATRIC:  Normal affect       ASSESSMENT:    1. Paroxysmal atrial fibrillation (HCC)   2. Atrial tachycardia (HCC)   3. Palpitations    PLAN:    In order of problems listed above:  #Paroxysmal atrial fibrillation #Atrial tachycardia Maintaining sinus rhythm.  Very low burden  of arrhythmia.  On Xarelto for stroke prophylaxis.  Tolerating metoprolol tartrate 25 mg by mouth twice daily.  Follow-up in 6 months with APP. Follow-up in 1 year with me.   Medication Adjustments/Labs and Tests Ordered: Current medicines are reviewed at length with the patient today.  Concerns regarding medicines are outlined above.  Orders Placed This Encounter  Procedures   EKG 12-Lead   No orders of the defined types were placed in this encounter.   I,Mathew Stumpf,acting as a Education administrator for Vickie Epley, MD.,have documented all relevant documentation on the behalf of Vickie Epley, MD,as directed by  Vickie Epley, MD while in the presence of Vickie Epley, MD.  I, Vickie Epley, MD, have reviewed all documentation for this visit. The documentation on 02/16/22 for the exam, diagnosis, procedures, and orders are all accurate and complete.   Signed, Hilton Cork. Quentin Ore, MD, [facility], [facility] 02/16/2022 9:23 PM    Electrophysiology [facility]

## 2022-02-16 ENCOUNTER — Encounter: Payer: Self-pay | Admitting: Cardiology

## 2022-02-16 ENCOUNTER — Ambulatory Visit: Payer: Medicare HMO | Admitting: Cardiology

## 2022-02-16 VITALS — BP 106/60 | HR 64 | Ht 66.0 in | Wt 119.0 lb

## 2022-02-16 DIAGNOSIS — I471 Supraventricular tachycardia: Secondary | ICD-10-CM | POA: Diagnosis not present

## 2022-02-16 DIAGNOSIS — I480 Paroxysmal atrial fibrillation: Secondary | ICD-10-CM

## 2022-02-16 DIAGNOSIS — R002 Palpitations: Secondary | ICD-10-CM | POA: Diagnosis not present

## 2022-02-16 DIAGNOSIS — I4719 Other supraventricular tachycardia: Secondary | ICD-10-CM

## 2022-02-16 NOTE — Patient Instructions (Addendum)
Medication Instructions:  Your physician recommends that you continue on your current medications as directed. Please refer to the Current Medication list given to you today. *If you need a refill on your cardiac medications before your next appointment, please call your pharmacy*  Lab Work: None. If you have labs (blood work) drawn today and your tests are completely normal, you will receive your results only by: [place] (if you have MyChart) OR A paper copy in the mail If you have any lab test that is abnormal or we need to change your treatment, we will call you to review the results.  Testing/Procedures: None.  Follow-Up: At [facility], you and your health needs are our priority.  As part of our continuing mission to provide you with exceptional heart care, we have created designated Provider Care Teams.  These Care Teams include your primary Cardiologist (physician) and Advanced Practice Providers (APPs -  Physician Assistants and Nurse Practitioners) who all work together to provide you with the care you need, when you need it.  Your physician wants you to follow-up in:  6 months with one of the following Advanced Practice Providers on your designated Care Team:    Tommye Standard, [place] Then  12 months with Lars Mage, MD  We recommend signing up for the patient portal called "MyChart".  Sign up information is provided on this After Visit Summary.  MyChart is used to connect with patients for Virtual Visits (Telemedicine).  Patients are able to view lab/test results, encounter notes, upcoming appointments, etc.  Non-urgent messages can be sent to your provider as well.   To learn more about what you can do with MyChart, go to [email or web address].    Any Other Special Instructions Will Be Listed Below (If Applicable).

## 2022-02-17 DIAGNOSIS — Z0000 Encounter for general adult medical examination without abnormal findings: Secondary | ICD-10-CM | POA: Diagnosis not present

## 2022-02-17 DIAGNOSIS — I4891 Unspecified atrial fibrillation: Secondary | ICD-10-CM | POA: Diagnosis not present

## 2022-02-17 DIAGNOSIS — G4700 Insomnia, unspecified: Secondary | ICD-10-CM | POA: Diagnosis not present

## 2022-02-17 DIAGNOSIS — R82998 Other abnormal findings in urine: Secondary | ICD-10-CM | POA: Diagnosis not present

## 2022-02-17 DIAGNOSIS — E785 Hyperlipidemia, unspecified: Secondary | ICD-10-CM | POA: Diagnosis not present

## 2022-02-17 DIAGNOSIS — D473 Essential (hemorrhagic) thrombocythemia: Secondary | ICD-10-CM | POA: Diagnosis not present

## 2022-02-17 DIAGNOSIS — Z7901 Long term (current) use of anticoagulants: Secondary | ICD-10-CM | POA: Diagnosis not present

## 2022-02-17 DIAGNOSIS — Z23 Encounter for immunization: Secondary | ICD-10-CM | POA: Diagnosis not present

## 2022-02-17 DIAGNOSIS — M810 Age-related osteoporosis without current pathological fracture: Secondary | ICD-10-CM | POA: Diagnosis not present

## 2022-02-17 DIAGNOSIS — E538 Deficiency of other specified B group vitamins: Secondary | ICD-10-CM | POA: Diagnosis not present

## 2022-02-19 ENCOUNTER — Other Ambulatory Visit: Payer: Self-pay | Admitting: Internal Medicine

## 2022-02-19 DIAGNOSIS — E785 Hyperlipidemia, unspecified: Secondary | ICD-10-CM

## 2022-02-19 DIAGNOSIS — Z1231 Encounter for screening mammogram for malignant neoplasm of breast: Secondary | ICD-10-CM

## 2022-03-10 ENCOUNTER — Ambulatory Visit
Admission: RE | Admit: 2022-03-10 | Discharge: 2022-03-10 | Disposition: A | Discharge status: Home / Self Care | Payer: Medicare HMO | Source: Ambulatory Visit | Attending: Internal Medicine | Admitting: Internal Medicine

## 2022-03-10 ENCOUNTER — Ambulatory Visit: Payer: Medicare HMO

## 2022-03-10 DIAGNOSIS — Z1231 Encounter for screening mammogram for malignant neoplasm of breast: Secondary | ICD-10-CM

## 2022-03-12 DIAGNOSIS — H268 Other specified cataract: Secondary | ICD-10-CM | POA: Diagnosis not present

## 2022-03-12 DIAGNOSIS — H40053 Ocular hypertension, bilateral: Secondary | ICD-10-CM | POA: Diagnosis not present

## 2022-04-28 ENCOUNTER — Ambulatory Visit
Admission: RE | Admit: 2022-04-28 | Discharge: 2022-04-28 | Disposition: A | Discharge status: Home / Self Care | Payer: Medicare HMO | Source: Ambulatory Visit | Attending: Internal Medicine | Admitting: Internal Medicine

## 2022-04-28 DIAGNOSIS — E785 Hyperlipidemia, unspecified: Secondary | ICD-10-CM

## 2022-04-28 DIAGNOSIS — I700 Atherosclerosis of aorta: Secondary | ICD-10-CM | POA: Diagnosis not present

## 2022-06-14 ENCOUNTER — Other Ambulatory Visit: Payer: Self-pay

## 2022-06-14 DIAGNOSIS — I480 Paroxysmal atrial fibrillation: Secondary | ICD-10-CM

## 2022-06-14 MED ORDER — RIVAROXABAN 20 MG PO TABS: 20.0000 mg | ORAL_TABLET | Freq: Every day | ORAL | 1 refills | Status: DC

## 2022-06-14 NOTE — Telephone Encounter (Signed)
Prescription refill request for Xarelto received.  Indication:Afib Last office visit:6/23 Weight:54 kg Age:81 Scr:0.6 CrCl:71.25  ml/min  Prescription refilled

## 2022-06-17 DIAGNOSIS — I4819 Other persistent atrial fibrillation: Secondary | ICD-10-CM | POA: Diagnosis not present

## 2022-06-17 DIAGNOSIS — R69 Illness, unspecified: Secondary | ICD-10-CM | POA: Diagnosis not present

## 2022-06-17 DIAGNOSIS — F03A0 Unspecified dementia, mild, without behavioral disturbance, psychotic disturbance, mood disturbance, and anxiety: Secondary | ICD-10-CM | POA: Diagnosis not present

## 2022-07-01 DIAGNOSIS — M810 Age-related osteoporosis without current pathological fracture: Secondary | ICD-10-CM | POA: Diagnosis not present

## 2022-07-02 ENCOUNTER — Other Ambulatory Visit: Payer: Self-pay | Admitting: Internal Medicine

## 2022-07-08 DIAGNOSIS — E538 Deficiency of other specified B group vitamins: Secondary | ICD-10-CM | POA: Diagnosis not present

## 2022-07-08 DIAGNOSIS — D473 Essential (hemorrhagic) thrombocythemia: Secondary | ICD-10-CM | POA: Diagnosis not present

## 2022-07-08 DIAGNOSIS — Z7901 Long term (current) use of anticoagulants: Secondary | ICD-10-CM | POA: Diagnosis not present

## 2022-07-08 DIAGNOSIS — C44320 Squamous cell carcinoma of skin of unspecified parts of face: Secondary | ICD-10-CM | POA: Diagnosis not present

## 2022-07-08 DIAGNOSIS — E785 Hyperlipidemia, unspecified: Secondary | ICD-10-CM | POA: Diagnosis not present

## 2022-07-08 DIAGNOSIS — G4700 Insomnia, unspecified: Secondary | ICD-10-CM | POA: Diagnosis not present

## 2022-07-08 DIAGNOSIS — H409 Unspecified glaucoma: Secondary | ICD-10-CM | POA: Diagnosis not present

## 2022-07-08 DIAGNOSIS — M810 Age-related osteoporosis without current pathological fracture: Secondary | ICD-10-CM | POA: Diagnosis not present

## 2022-07-08 DIAGNOSIS — I4891 Unspecified atrial fibrillation: Secondary | ICD-10-CM | POA: Diagnosis not present

## 2022-07-09 DIAGNOSIS — H40053 Ocular hypertension, bilateral: Secondary | ICD-10-CM | POA: Diagnosis not present

## 2022-08-21 ENCOUNTER — Inpatient Hospital Stay: Payer: Medicare HMO | Attending: Hematology and Oncology | Admitting: Hematology and Oncology

## 2022-08-21 ENCOUNTER — Inpatient Hospital Stay: Payer: Medicare HMO

## 2022-08-21 VITALS — BP 138/63 | HR 70 | Temp 97.0°F | Resp 20 | Ht 66.0 in | Wt 119.3 lb

## 2022-08-21 DIAGNOSIS — Z87891 Personal history of nicotine dependence: Secondary | ICD-10-CM | POA: Diagnosis not present

## 2022-08-21 DIAGNOSIS — D75839 Thrombocytosis, unspecified: Secondary | ICD-10-CM | POA: Insufficient documentation

## 2022-08-21 DIAGNOSIS — R5383 Other fatigue: Secondary | ICD-10-CM | POA: Insufficient documentation

## 2022-08-21 NOTE — Assessment & Plan Note (Signed)
02/10/2016: WBC 4.7, hemoglobin 14.2, platelets 413 04/06/2018: WBC 7.6, hemoglobin 13.4, platelets 474 01/05/2021: WBC 6.42, hemoglobin 14.5, platelets 472 02/10/2022: WBC 7.67, Hemoglobin 15.7, platelets 559  Differential diagnosis 1. Primary thrombocytosis: Related to myeloproliferative disorders of the bone marrow especially essential thrombocytosis and CML. I would like to send for BCR-ABL as well as JAK-2 dictation testings. Patient understands that JAK2 mutation is only present in 50% of essential thrombocytosis so the test is advantageous only if it is positive. If it is negative, it does not rule out. 2. Secondary/reactive thrombocytosis Different causes including infections, inflammation, iron deficiency.  I would like to send out for C-reactive protein, iron studies with ferritin to complete the workup.  Treatment options: 1. If it is primary essential thrombocytosis, treatment would depend on platelet count level as well as history of thrombosis. A. For low risk patients, (platelet counts less than 1000 and no history of blood clots) the treatment would be with aspirin therapy B. for high risk patients(platelet counts greater than 1000/history of blood clot) the treatment would be platelet lowering therapy with aspirin 2. Treatment of secondary thrombocytosis would be to treat underlying cause. There would not be any risk of thrombosis with secondary thrombocytosis.  Return to clinic in 3 weeks to discuss the results of these tests.

## 2022-08-21 NOTE — Progress Notes (Signed)
[place] NOTE  Patient Care Team: Prince Solian, MD as PCP - General (Internal Medicine) Vickie Epley, MD as PCP - Electrophysiology (Cardiology)  CHIEF COMPLAINTS/PURPOSE OF CONSULTATION:  Thrombocytosis  HISTORY OF PRESENTING ILLNESS:  Destiny Sanchez 81 y.o. female is here because of recent diagnosis of thrombocytosis.  Patient has not had any major health issues lately other than mild fatigue.  She has been watched and monitored for the elevated platelet count.  Since that went up to 559 she was referred to [place].  I reviewed her records extensively and collaborated the history with the patient.     MEDICAL HISTORY:  Past Medical History:  Diagnosis Date   Adenomatous colon polyp 06/2006   Allergy    Anemia    Anxiety    Blood transfusion 1967 &1971   Cancer ([place])    basal cell on nose   Cataract    bil cataracts removed   Chronic kidney disease    left kidney had a diverticulum - surgery partial nephrectomy   Ectopic atrial tachycardia (HCC)    nonsustained   GERD (gastroesophageal reflux disease)    Glaucoma    History of migraine    Hx of cardiovascular stress test    a. ETT-Echo (07/2013):  Normal. //  b. ETT-Echo 6/17 - Normal study after maximal exercise   IBS (irritable bowel syndrome)    Osteopenia    Vasovagal syncope 09/30/2017    SURGICAL HISTORY: Past Surgical History:  Procedure Laterality Date   ABDOMINAL HYSTERECTOMY  1975   ABDOMINAL HYSTERECTOMY     APPENDECTOMY     CATARACT EXTRACTION W/ INTRAOCULAR LENS  IMPLANT, BILATERAL     COLONOSCOPY     HEMORROIDECTOMY  1973   MOHS SURGERY     PARTIAL NEPHRECTOMY  1971   diverticulum on left   UPPER GASTROINTESTINAL ENDOSCOPY      SOCIAL HISTORY: Social History   Socioeconomic History   Marital status: Married    Spouse name: Not on file   Number of children: Not on file   Years of education: Not on file   Highest education level: Not on file  Occupational  History   Not on file  Tobacco Use   Smoking status: Former    Packs/day: 1.00    Years: 10.00    Total pack years: 10.00    Types: Cigarettes    Quit date: 08/30/1962    Years since quitting: 60.0   Smokeless tobacco: Never  Vaping Use   Vaping Use: Never used  Substance and Sexual Activity   Alcohol use: Yes    Alcohol/week: 1.0 standard drink of alcohol    Types: 1 Glasses of wine per week   Drug use: No   Sexual activity: Not on file  Other Topics Concern   Not on file  Social History Narrative   Retired Marine scientist   Lives in [place]   Spouse owned a Research scientist (medical) which he [place] Strain: Not on [employer] Insecurity: Not on file  Transportation Needs: Not on file  Physical Activity: Not on file  Stress: Not on file  Social Connections: Not on file  Intimate Partner Violence: Not on file    FAMILY HISTORY: Family History  Problem Relation Age of Onset   Colon cancer Maternal Aunt    Throat cancer Father        larynx cancer   Esophageal cancer Neg Hx  Pancreatic cancer Neg Hx    Rectal cancer Neg Hx    Stomach cancer Neg Hx     ALLERGIES:  is allergic to lactose.  MEDICATIONS:  Current Outpatient Medications  Medication Sig Dispense Refill   Brinzolamide-Brimonidine (SIMBRINZA) 1-0.2 % SUSP Apply 1 drop to eye 3 (three) times daily.     Calcium Carbonate-Vitamin D (CALTRATE 600+D PO) Take 1,200 mg by mouth daily.       Cholecalciferol (VITAMIN D) 2000 UNITS tablet Take 2,000 Units by mouth daily.       clobetasol ointment (TEMOVATE) 8.75 % Apply 1 application topically as needed (skin irritation).     cyanocobalamin (,VITAMIN B-12,) 1000 MCG/ML injection Inject 1,000 mcg into the skin every 30 (thirty) days.     donepezil (ARICEPT) 5 MG tablet Take 5 mg by mouth as needed (for bones).     latanoprost (XALATAN) 0.005 % ophthalmic solution Place 1 drop into both eyes at bedtime.     metoprolol tartrate  (LOPRESSOR) 25 MG tablet Take 1 tablet by mouth twice daily 180 tablet 2   Oyster Shell Calcium 500 MG TABS Take 1 tablet by mouth daily.     PREMARIN vaginal cream Place 1 Applicatorful vaginally at bedtime.     PROLIA 60 MG/ML SOLN injection Inject 60 mg into the skin every 28 (twenty-eight) days.     rivaroxaban (XARELTO) 20 MG TABS tablet Take 1 tablet (20 mg total) by mouth daily with supper. 90 tablet 1   zolpidem (AMBIEN) 5 MG tablet Take 5 mg by mouth at bedtime as needed for sleep.     Current Facility-Administered Medications  Medication Dose Route Frequency Provider Last Rate Last Admin   0.9 %  sodium chloride infusion  500 mL Intravenous Continuous Ladene Artist, MD        REVIEW OF SYSTEMS:   Constitutional: Denies fevers, chills or abnormal night sweats   All other systems were reviewed with the patient and are negative.  PHYSICAL EXAMINATION: ECOG PERFORMANCE STATUS: 1 - Symptomatic but completely ambulatory  Vitals:   08/21/22 0849  BP: 138/63  Pulse: 70  Resp: 20  Temp: (!) 97 F (36.1 C)   Filed Weights   08/21/22 0849  Weight: 119 lb 4.8 oz (54.1 kg)    GENERAL:alert, no distress and comfortable    LABORATORY DATA:  I have reviewed the data as listed Lab Results  Component Value Date   WBC 6.5 04/12/2019   HGB 14.3 04/12/2019   HCT 42.2 04/12/2019   MCV 96.9 04/12/2019   PLT 409.0 (H) 04/12/2019   Lab Results  Component Value Date   NA 140 04/12/2019   K 4.2 04/12/2019   CL 104 04/12/2019   CO2 27 04/12/2019    RADIOGRAPHIC STUDIES: I have personally reviewed the radiological reports and agreed with the findings in the report.  ASSESSMENT AND PLAN:  Thrombocytosis 02/10/2016: WBC 4.7, hemoglobin 14.2, platelets 413 04/06/2018: WBC 7.6, hemoglobin 13.4, platelets 474 01/05/2021: WBC 6.42, hemoglobin 14.5, platelets 472 02/10/2022: WBC 7.67, Hemoglobin 15.7, platelets 559  Differential diagnosis 1. Primary thrombocytosis: Related to  myeloproliferative disorders of the bone marrow especially essential thrombocytosis and CML. I would like to send for BCR-ABL as well as JAK-2 dictation testings. Patient understands that JAK2 mutation is only present in 50% of essential thrombocytosis so the test is advantageous only if it is positive. If it is negative, it does not rule out. 2. Secondary/reactive thrombocytosis Different causes including infections, inflammation, iron deficiency.  I would like to send out for iron studies with ferritin to complete the workup.  Treatment options: 1. If it is primary essential thrombocytosis, treatment would depend on platelet count level as well as history of thrombosis. A. For low risk patients, (platelet counts less than 1000 and no history of blood clots) the treatment would be with aspirin therapy B. for high risk patients(platelet counts greater than 1000/history of blood clot) the treatment would be platelet lowering therapy with aspirin 2. Treatment of secondary thrombocytosis would be to treat underlying cause. There would not be any risk of thrombosis with secondary thrombocytosis.  Return to clinic in 2 weeks to discuss the results of these tests.      All questions were answered. The patient knows to call the clinic with any problems, questions or concerns.    Harriette Ohara, MD 08/21/22

## 2022-08-24 ENCOUNTER — Other Ambulatory Visit: Payer: Self-pay

## 2022-08-24 ENCOUNTER — Inpatient Hospital Stay: Payer: Medicare HMO

## 2022-08-24 DIAGNOSIS — Z87891 Personal history of nicotine dependence: Secondary | ICD-10-CM | POA: Diagnosis not present

## 2022-08-24 DIAGNOSIS — R5383 Other fatigue: Secondary | ICD-10-CM | POA: Diagnosis not present

## 2022-08-24 DIAGNOSIS — D75839 Thrombocytosis, unspecified: Secondary | ICD-10-CM

## 2022-08-24 LAB — CBC WITH DIFFERENTIAL/PLATELET
Abs Immature Granulocytes: 0.04 10*3/uL (ref 0.00–0.07)
Basophils Absolute: 0.1 10*3/uL (ref 0.0–0.1)
Basophils Relative: 1 %
Eosinophils Absolute: 0.1 10*3/uL (ref 0.0–0.5)
Eosinophils Relative: 1 %
HCT: 42.7 % (ref 36.0–46.0)
Hemoglobin: 14.4 g/dL (ref 12.0–15.0)
Immature Granulocytes: 0 %
Lymphocytes Relative: 10 %
Lymphs Abs: 0.9 10*3/uL (ref 0.7–4.0)
MCH: 32.8 pg (ref 26.0–34.0)
MCHC: 33.7 g/dL (ref 30.0–36.0)
MCV: 97.3 fL (ref 80.0–100.0)
Monocytes Absolute: 0.6 10*3/uL (ref 0.1–1.0)
Monocytes Relative: 7 %
Neutro Abs: 7.2 10*3/uL (ref 1.7–7.7)
Neutrophils Relative %: 81 %
Platelets: 516 10*3/uL — ABNORMAL HIGH (ref 150–400)
RBC: 4.39 MIL/uL (ref 3.87–5.11)
RDW: 13.1 % (ref 11.5–15.5)
WBC: 8.9 10*3/uL (ref 4.0–10.5)
nRBC: 0 % (ref 0.0–0.2)

## 2022-08-24 LAB — IRON AND IRON BINDING CAPACITY (CC-WL,HP ONLY)
Iron: 118 ug/dL (ref 28–170)
Saturation Ratios: 37 % — ABNORMAL HIGH (ref 10.4–31.8)
TIBC: 316 ug/dL (ref 250–450)
UIBC: 198 ug/dL (ref 148–442)

## 2022-08-24 LAB — FERRITIN: Ferritin: 75 ng/mL (ref 11–307)

## 2022-08-29 ENCOUNTER — Other Ambulatory Visit: Payer: Self-pay | Admitting: Internal Medicine

## 2022-08-29 DIAGNOSIS — I480 Paroxysmal atrial fibrillation: Secondary | ICD-10-CM

## 2022-08-29 NOTE — Progress Notes (Unsigned)
Cardiology Office Note Date:  08/29/2022  Patient ID:  [place], Millon 1941/02/24, MRN [id number] PCP:  Prince Solian, MD  Electrophysiologist:  Dr. Rayann Heman >> Dr. Quentin Ore     Chief Complaint: *** 6 mo  History of Present Illness: Destiny Sanchez is a 81 y.o. female with history of Paroxysmal AFib, IBS, GERD, ectopic ATach, PACs, problem list also mentions vasovagal syncope.  She saw Dr. Rayann Heman,  02/15/2019 via tele-health visit.  She was doing well, h/o palpitations felt likely 2/2 PACs and nonsustained ATach, they were controlled, maintained on Xarelto.  Planned for APP visit in 6 months  I saw her Dec 2020 She is doing great.  Very active, golfs regularly, always busy, exercises at the park without difficulty or exertional intolerances.  She recalls once in the last 60moabut 30seconds of palpitations.  No CP, SOB, DOE.  No dizzy spells, near syncope or syncope No bleeding or signs of bleeding No changes were made  She last saw Dr. ARayann HemanJune 2021, continued to well, no changes were made, and planned for an annual APP visit.  I saw her June 2022 She continues to do quite well. Remains active, still golfing. She has had 2 episodes of palpitations, lasting a minute or two. They are startling and do make her feel poorly, no CP, no near syncope or syncope She denies any SOB, will get winded with inclines though this is her baseline. No bleeding or signs of bleeding She felt well, minimal burden of arrhythmia by symptoms,  and no changes were made  She saw Dr. LQuentin OreJune 2023, again doing quite well, minimal if any symptoms from her arrhythmia, no changes were made.  Planned for 680moPP visit and a year with him  She saw Dr. GuLindi Adieoncology, 08/21/22 for evaluation of thrombosis, workup initiated.   *** xarelto, bleeding, dose, labs *** symptoms   Past Medical History:  Diagnosis Date   Adenomatous colon polyp 06/2006   Allergy    Anemia    Anxiety    Blood  transfusion 1967 &1971   [place]   basal cell on nose   Cataract    bil cataracts removed   Chronic kidney disease    left kidney had a diverticulum - surgery partial nephrectomy   Ectopic atrial tachycardia (HCC)    nonsustained   GERD (gastroesophageal reflux disease)    Glaucoma    History of migraine    Hx of cardiovascular stress test    a. ETT-Echo (07/2013):  Normal. //  b. ETT-Echo 6/17 - Normal study after maximal exercise   IBS (irritable bowel syndrome)    Osteopenia    Vasovagal syncope 09/30/2017    Past Surgical History:  Procedure Laterality Date   ABDOMINAL HYSTERECTOMY  1975   ABDOMINAL HYSTERECTOMY     APPENDECTOMY     CATARACT EXTRACTION W/ INTRAOCULAR LENS  IMPLANT, BILATERAL     COLONOSCOPY     HEMORROIDECTOMY  1973   MOHS SURGERY     PARTIAL NEPHRECTOMY  1971   diverticulum on left   UPPER GASTROINTESTINAL ENDOSCOPY      Current Outpatient Medications  Medication Sig Dispense Refill   Brinzolamide-Brimonidine (SIMBRINZA) 1-0.2 % SUSP Apply 1 drop to eye 3 (three) times daily.     Calcium Carbonate-Vitamin D (CALTRATE 600+D PO) Take 1,200 mg by mouth daily.       Cholecalciferol (VITAMIN D) 2000 UNITS tablet Take 2,000 Units by mouth daily.  clobetasol ointment (TEMOVATE) 0.53 % Apply 1 application topically as needed (skin irritation).     cyanocobalamin (,VITAMIN B-12,) 1000 MCG/ML injection Inject 1,000 mcg into the skin every 30 (thirty) days.     donepezil (ARICEPT) 5 MG tablet Take 5 mg by mouth as needed (for bones).     latanoprost (XALATAN) 0.005 % ophthalmic solution Place 1 drop into both eyes at bedtime.     metoprolol tartrate (LOPRESSOR) 25 MG tablet Take 1 tablet by mouth twice daily 180 tablet 2   Oyster Shell Calcium 500 MG TABS Take 1 tablet by mouth daily.     PREMARIN vaginal cream Place 1 Applicatorful vaginally at bedtime.     PROLIA 60 MG/ML SOLN injection Inject 60 mg into the skin every 28 (twenty-eight) days.      rivaroxaban (XARELTO) 20 MG TABS tablet Take 1 tablet (20 mg total) by mouth daily with supper. 90 tablet 1   zolpidem (AMBIEN) 5 MG tablet Take 5 mg by mouth at bedtime as needed for sleep.     Current Facility-Administered Medications  Medication Dose Route Frequency Provider Last Rate Last Admin   0.9 %  sodium chloride infusion  500 mL Intravenous Continuous Ladene Artist, MD        Allergies:   Lactose   Social History:  The patient  reports that she quit smoking about 60 years ago. Her smoking use included cigarettes. She has a 10.00 pack-year smoking history. She has never used smokeless tobacco. She reports current alcohol use of about 1.0 standard drink of alcohol per week. She reports that she does not use drugs.   Family History:  The patient's family history includes Colon cancer in her maternal aunt; Throat cancer in her father.  ROS:  Please see the history of present illness.   All other systems are reviewed and otherwise negative.   PHYSICAL EXAM:  VS:  There were no vitals taken for this visit. BMI: There is no height or weight on file to calculate BMI. Well nourished, well developed, in no acute distress  HEENT: normocephalic, atraumatic  Neck: no JVD, carotid bruits or masses Cardiac:  *** RRR; no significant murmurs, no rubs, or gallops Lungs:  *** CTA b/l, no wheezing, rhonchi or rales  Abd: soft, nontender MS: no deformity, thin body habitus, age appropriate atrophy Ext: *** no edema  Skin: warm and dry, no rash Neuro:  No gross deficits appreciated Psych: euthymic mood, full affect   EKG:  not done today   11/17/2016: TTE Study Conclusions - Left ventricle: The cavity size was normal. Wall thickness was   increased in a pattern of mild LVH. Systolic function was   vigorous. The estimated ejection fraction was in the range of 65%   to 70%. Wall motion was normal; there were no regional wall   motion abnormalities. Doppler parameters are consistent  with   abnormal left ventricular relaxation (grade 1 diastolic   dysfunction). The E/e&' ratio is between 8-15, suggesting   indeterminate LV filling pressure. - Mitral valve: Mildly thickened leaflets . There was mild   regurgitation. - Left atrium: The atrium was normal in size. - Right atrium: The atrium was normal in size. - Tricuspid valve: There was mild regurgitation. - Pulmonary arteries: PA peak pressure: 29 mm Hg (S). - Inferior vena cava: The vessel was normal in size. The   respirophasic diameter changes were in the normal range (= 50%),   consistent with normal central venous pressure.  Impressions: - LVEF 65-70%, mild LVH, normal wall motion, diastolic dysfunction,   indetermiante LV filling pressure, mild MR, normal LA size, mild   TR, RVSP 29 mmHG, normal IVC.   Recent Labs: 08/24/2022: Hemoglobin 14.4; Platelets 516  No results found for requested labs within last 365 days.   CrCl cannot be calculated (Patient's most recent lab result is older than the maximum 21 days allowed.).   Wt Readings from Last 3 Encounters:  08/21/22 119 lb 4.8 oz (54.1 kg)  02/16/22 119 lb (54 kg)  02/23/21 120 lb 9.6 oz (54.7 kg)     Other studies reviewed: Additional studies/records reviewed today include: summarized above  ASSESSMENT AND PLAN:  1. Paroxysmal AFib      and PACs, ATach     CHA2DS2Vasc is 3 (age/gender), on Xarelto , *** appropriately dosed     *** Very low burden by symptoms, she does not want to make any changes     ***    Disposition: ***   Current medicines are reviewed at length with the patient today.  The patient did not have any concerns regarding medicines.  Venetia Night, PA-C 08/29/2022 8:49 AM     [address] [place] [address] [phone] (office)  [phone] (fax)

## 2022-08-31 ENCOUNTER — Encounter: Payer: Self-pay | Admitting: Physician Assistant

## 2022-08-31 ENCOUNTER — Ambulatory Visit: Payer: Medicare HMO | Attending: Physician Assistant | Admitting: Physician Assistant

## 2022-08-31 VITALS — BP 110/68 | HR 66 | Ht 66.0 in | Wt 120.0 lb

## 2022-08-31 DIAGNOSIS — I480 Paroxysmal atrial fibrillation: Secondary | ICD-10-CM | POA: Diagnosis not present

## 2022-08-31 DIAGNOSIS — I4719 Other supraventricular tachycardia: Secondary | ICD-10-CM

## 2022-08-31 LAB — BASIC METABOLIC PANEL
BUN/Creatinine Ratio: 41 — ABNORMAL HIGH (ref 12–28)
BUN: 19 mg/dL (ref 8–27)
CO2: 22 mmol/L (ref 20–29)
Calcium: 9.3 mg/dL (ref 8.7–10.3)
Chloride: 104 mmol/L (ref 96–106)
Creatinine, Ser: 0.46 mg/dL — ABNORMAL LOW (ref 0.57–1.00)
Glucose: 90 mg/dL (ref 70–99)
Potassium: 4.6 mmol/L (ref 3.5–5.2)
Sodium: 138 mmol/L (ref 134–144)
eGFR: 95 mL/min/{1.73_m2} (ref >59)

## 2022-08-31 LAB — JAK2 (INCLUDING V617F AND EXON 12), MPL,& CALR W/RFL MPN PANEL (NGS)

## 2022-08-31 MED ORDER — RIVAROXABAN 20 MG PO TABS: ORAL_TABLET | ORAL | 3 refills | Status: DC

## 2022-08-31 NOTE — Progress Notes (Signed)
HEMATOLOGY-ONCOLOGY TELEPHONE VISIT PROGRESS NOTE  I connected with our patient on 09/07/22 at  8:30 AM EST by telephone and verified that I am speaking with the correct person using two identifiers.  I discussed the limitations, risks, security and privacy concerns of performing an evaluation and management service by telephone and the availability of in person appointments.  I also discussed with the patient that there may be a patient responsible charge related to this service. The patient expressed understanding and agreed to proceed.   History of Present Illness: Destiny Sanchez 82 y.o. female is here because of recent diagnosis of thrombocytosis.  Patient has not had any major health issues lately other than mild fatigue.  She has been watched and monitored for the elevated platelet count.  Since that went up to 559 she was referred to [place].  Upon rechecking we found that the platelet count was 516.  She presents to the clinic via phone visit to discuss the test results.    REVIEW OF SYSTEMS:   Constitutional: Denies fevers, chills or abnormal weight loss All other systems were reviewed with the patient and are negative. Observations/Objective:     Assessment Plan:  Thrombocytosis 02/10/2016: WBC 4.7, hemoglobin 14.2, platelets 413 04/06/2018: WBC 7.6, hemoglobin 13.4, platelets 474 01/05/2021: WBC 6.42, hemoglobin 14.5, platelets 472 02/10/2022: WBC 7.67, Hemoglobin 15.7, platelets 559 08/24/2022: WBC: 8.9, Hemoglobin 14.4, Platelets: 516, JAK2 Mutation: Positive  Discussion: I discussed with the patient that based on JAK2 positivity she has essential thrombocytosis which is a stem cell defect that causes increased platelet production.  Since the patient is on blood thinners there is no significant increase for blood clotting.  I did not recommend aspirin therapy because that would increase her bleeding risk significantly.  Plan: Watchful monitoring with rechecking of labs in 6 months and  follow-up.    I discussed the assessment and treatment plan with the patient. The patient was provided an opportunity to ask questions and all were answered. The patient agreed with the plan and demonstrated an understanding of the instructions. The patient was advised to call back or seek an in-person evaluation if the symptoms worsen or if the condition fails to improve as anticipated.   I provided 12 minutes of non-face-to-face time during this encounter.  This includes time for charting and coordination of care   Harriette Ohara, MD  I Gardiner Coins am acting as a scribe for Dr.Tayley Mudrick  I have reviewed the above documentation for accuracy and completeness, and I agree with the above.

## 2022-08-31 NOTE — Patient Instructions (Signed)
Medication Instructions:   Your physician recommends that you continue on your current medications as directed. Please refer to the Current Medication list given to you today.  *If you need a refill on your cardiac medications before your next appointment, please call your pharmacy*   Lab Work:  BMET TODAY   If you have labs (blood work) drawn today and your tests are completely normal, you will receive your results only by: [place] (if you have MyChart) OR A paper copy in the mail If you have any lab test that is abnormal or we need to change your treatment, we will call you to review the results.   Testing/Procedures: NONE ORDERED  TODAY     Follow-Up: At [facility], you and your health needs are our priority.  As part of our continuing mission to provide you with exceptional heart care, we have created designated Provider Care Teams.  These Care Teams include your primary Cardiologist (physician) and Advanced Practice Providers (APPs -  Physician Assistants and Nurse Practitioners) who all work together to provide you with the care you need, when you need it.  We recommend signing up for the patient portal called "MyChart".  Sign up information is provided on this After Visit Summary.  MyChart is used to connect with patients for Virtual Visits (Telemedicine).  Patients are able to view lab/test results, encounter notes, upcoming appointments, etc.  Non-urgent messages can be sent to your provider as well.   To learn more about what you can do with MyChart, go to [email or web address].    Your next appointment:   6 month(s)  The format for your next appointment:   In Person  Provider:   Lars Mage, MD     Other Instructions   Important Information About Sugar

## 2022-08-31 NOTE — Telephone Encounter (Signed)
Prescription refill request for Xarelto received.  Indication:afib Last office visit:6/23 Weight:54.1  kg Age:82 Scr:0.6 CrCl:60.67  ml/min  Prescription refilled

## 2022-09-07 ENCOUNTER — Inpatient Hospital Stay: Payer: Medicare HMO | Attending: Hematology and Oncology | Admitting: Hematology and Oncology

## 2022-09-07 DIAGNOSIS — D75839 Thrombocytosis, unspecified: Secondary | ICD-10-CM

## 2022-09-07 NOTE — Assessment & Plan Note (Signed)
02/10/2016: WBC 4.7, hemoglobin 14.2, platelets 413 04/06/2018: WBC 7.6, hemoglobin 13.4, platelets 474 01/05/2021: WBC 6.42, hemoglobin 14.5, platelets 472 02/10/2022: WBC 7.67, Hemoglobin 15.7, platelets 559 08/24/2022: WBC: 8.9, Hemoglobin 14.4, Platelets: 516, JAK2 Mutation: Positive  Discussion: I discussed with the patient that based on JAK2 positivity she has essential thrombocytosis which is a stem cell defect that causes increased platelet production.  Since the patient is on blood thinners there is no significant increase for blood clotting.  I did not recommend aspirin therapy because that would increase her bleeding risk significantly.  Plan: Watchful monitoring with rechecking of labs in 6 months and follow-up.

## 2022-09-08 ENCOUNTER — Telehealth: Payer: Self-pay | Admitting: Hematology and Oncology

## 2022-09-08 NOTE — Telephone Encounter (Signed)
Scheduled appointment per 1/9 los. Patient is aware.

## 2022-09-16 DIAGNOSIS — I4891 Unspecified atrial fibrillation: Secondary | ICD-10-CM | POA: Diagnosis not present

## 2022-09-16 DIAGNOSIS — Z85828 Personal history of other malignant neoplasm of skin: Secondary | ICD-10-CM | POA: Diagnosis not present

## 2022-09-16 DIAGNOSIS — Z7901 Long term (current) use of anticoagulants: Secondary | ICD-10-CM | POA: Diagnosis not present

## 2022-09-16 DIAGNOSIS — I739 Peripheral vascular disease, unspecified: Secondary | ICD-10-CM | POA: Diagnosis not present

## 2022-09-16 DIAGNOSIS — Z008 Encounter for other general examination: Secondary | ICD-10-CM | POA: Diagnosis not present

## 2022-09-16 DIAGNOSIS — H40059 Ocular hypertension, unspecified eye: Secondary | ICD-10-CM | POA: Diagnosis not present

## 2022-09-16 DIAGNOSIS — G4700 Insomnia, unspecified: Secondary | ICD-10-CM | POA: Diagnosis not present

## 2022-09-16 DIAGNOSIS — D6869 Other thrombophilia: Secondary | ICD-10-CM | POA: Diagnosis not present

## 2022-09-16 DIAGNOSIS — Z87891 Personal history of nicotine dependence: Secondary | ICD-10-CM | POA: Diagnosis not present

## 2022-09-16 DIAGNOSIS — I700 Atherosclerosis of aorta: Secondary | ICD-10-CM | POA: Diagnosis not present

## 2022-09-16 DIAGNOSIS — G3184 Mild cognitive impairment, so stated: Secondary | ICD-10-CM | POA: Diagnosis not present

## 2022-12-03 DIAGNOSIS — H268 Other specified cataract: Secondary | ICD-10-CM | POA: Diagnosis not present

## 2022-12-03 DIAGNOSIS — H40053 Ocular hypertension, bilateral: Secondary | ICD-10-CM | POA: Diagnosis not present

## 2022-12-17 DIAGNOSIS — I4819 Other persistent atrial fibrillation: Secondary | ICD-10-CM | POA: Diagnosis not present

## 2022-12-17 DIAGNOSIS — G3184 Mild cognitive impairment, so stated: Secondary | ICD-10-CM | POA: Diagnosis not present

## 2022-12-17 DIAGNOSIS — R69 Illness, unspecified: Secondary | ICD-10-CM | POA: Diagnosis not present

## 2022-12-17 DIAGNOSIS — D75839 Thrombocytosis, unspecified: Secondary | ICD-10-CM | POA: Diagnosis not present

## 2023-01-06 DIAGNOSIS — M810 Age-related osteoporosis without current pathological fracture: Secondary | ICD-10-CM | POA: Diagnosis not present

## 2023-01-12 ENCOUNTER — Other Ambulatory Visit (HOSPITAL_BASED_OUTPATIENT_CLINIC_OR_DEPARTMENT_OTHER): Payer: Self-pay

## 2023-02-11 DIAGNOSIS — H268 Other specified cataract: Secondary | ICD-10-CM | POA: Diagnosis not present

## 2023-02-23 ENCOUNTER — Telehealth: Payer: Self-pay | Admitting: Hematology and Oncology

## 2023-02-23 NOTE — Telephone Encounter (Signed)
Patient is aware of rescheduled appointment time/dates.   

## 2023-03-02 DIAGNOSIS — M810 Age-related osteoporosis without current pathological fracture: Secondary | ICD-10-CM | POA: Diagnosis not present

## 2023-03-02 DIAGNOSIS — E538 Deficiency of other specified B group vitamins: Secondary | ICD-10-CM | POA: Diagnosis not present

## 2023-03-02 DIAGNOSIS — Z79899 Other long term (current) drug therapy: Secondary | ICD-10-CM | POA: Diagnosis not present

## 2023-03-02 DIAGNOSIS — E785 Hyperlipidemia, unspecified: Secondary | ICD-10-CM | POA: Diagnosis not present

## 2023-03-07 ENCOUNTER — Ambulatory Visit: Payer: Medicare HMO | Admitting: Physician Assistant

## 2023-03-08 ENCOUNTER — Inpatient Hospital Stay: Payer: Medicare HMO

## 2023-03-08 ENCOUNTER — Inpatient Hospital Stay: Payer: Medicare HMO | Admitting: Hematology and Oncology

## 2023-03-09 DIAGNOSIS — Z0000 Encounter for general adult medical examination without abnormal findings: Secondary | ICD-10-CM | POA: Diagnosis not present

## 2023-03-09 DIAGNOSIS — R82998 Other abnormal findings in urine: Secondary | ICD-10-CM | POA: Diagnosis not present

## 2023-03-09 DIAGNOSIS — C44320 Squamous cell carcinoma of skin of unspecified parts of face: Secondary | ICD-10-CM | POA: Diagnosis not present

## 2023-03-09 DIAGNOSIS — R252 Cramp and spasm: Secondary | ICD-10-CM | POA: Diagnosis not present

## 2023-03-09 DIAGNOSIS — M810 Age-related osteoporosis without current pathological fracture: Secondary | ICD-10-CM | POA: Diagnosis not present

## 2023-03-09 DIAGNOSIS — Z7901 Long term (current) use of anticoagulants: Secondary | ICD-10-CM | POA: Diagnosis not present

## 2023-03-09 DIAGNOSIS — Z1331 Encounter for screening for depression: Secondary | ICD-10-CM | POA: Diagnosis not present

## 2023-03-09 DIAGNOSIS — Z1339 Encounter for screening examination for other mental health and behavioral disorders: Secondary | ICD-10-CM | POA: Diagnosis not present

## 2023-03-09 DIAGNOSIS — G4700 Insomnia, unspecified: Secondary | ICD-10-CM | POA: Diagnosis not present

## 2023-03-09 DIAGNOSIS — I4891 Unspecified atrial fibrillation: Secondary | ICD-10-CM | POA: Diagnosis not present

## 2023-03-09 DIAGNOSIS — E538 Deficiency of other specified B group vitamins: Secondary | ICD-10-CM | POA: Diagnosis not present

## 2023-03-09 DIAGNOSIS — D473 Essential (hemorrhagic) thrombocythemia: Secondary | ICD-10-CM | POA: Diagnosis not present

## 2023-03-09 DIAGNOSIS — E785 Hyperlipidemia, unspecified: Secondary | ICD-10-CM | POA: Diagnosis not present

## 2023-03-23 NOTE — Progress Notes (Signed)
Patient Care Team: Chilton Greathouse, MD as PCP - General (Internal Medicine) Lanier Prude, MD as PCP - Electrophysiology (Cardiology) Serena Croissant, MD as Consulting Physician (Hematology and Oncology)  DIAGNOSIS: No diagnosis found.  SUMMARY OF ONCOLOGIC HISTORY: Oncology History   No history exists.    CHIEF COMPLIANT: Thrombocytosis   INTERVAL HISTORY: Destiny Sanchez is a 82 y.o. female is here because of recent diagnosis of thrombocytosis. She presents to the clinic for a follow-up.    ALLERGIES:  is allergic to lactose.  MEDICATIONS:  Current Outpatient Medications  Medication Sig Dispense Refill   Brinzolamide-Brimonidine (SIMBRINZA) 1-0.2 % SUSP Apply 1 drop to eye 3 (three) times daily.     Calcium Carbonate-Vitamin D (CALTRATE 600+D PO) Take 1,200 mg by mouth daily.       Cholecalciferol (VITAMIN D) 2000 UNITS tablet Take 2,000 Units by mouth daily.       donepezil (ARICEPT) 5 MG tablet Take 5 mg by mouth as needed (for bones).     latanoprost (XALATAN) 0.005 % ophthalmic solution Place 1 drop into both eyes at bedtime.     metoprolol tartrate (LOPRESSOR) 25 MG tablet Take 1 tablet by mouth twice daily 180 tablet 2   Oyster Shell Calcium 500 MG TABS Take 1 tablet by mouth daily.     rivaroxaban (XARELTO) 20 MG TABS tablet TAKE 1 TABLET BY MOUTH EVERY DAY WITH SUPPER 90 tablet 3   Current Facility-Administered Medications  Medication Dose Route Frequency Provider Last Rate Last Admin   0.9 %  sodium chloride infusion  500 mL Intravenous Continuous Meryl Dare, MD        PHYSICAL EXAMINATION: ECOG PERFORMANCE STATUS: {CHL ONC ECOG ZD:[id number]}  There were no vitals filed for this visit. There were no vitals filed for this visit.  BREAST:*** No palpable masses or nodules in either right or left breasts. No palpable axillary supraclavicular or infraclavicular adenopathy no breast tenderness or nipple discharge. (exam performed in the presence of a  chaperone)  LABORATORY DATA:  I have reviewed the data as listed    Latest Ref Rng & Units 08/31/2022    9:51 AM 04/12/2019    7:36 AM 04/06/2018   12:52 PM  CMP  Glucose 70 - 99 mg/dL 90  83  595   BUN 8 - 27 mg/dL 19  16  19    Creatinine 0.57 - 1.00 mg/dL 6.38  7.56  4.33   Sodium 134 - 144 mmol/L 138  140  135   Potassium 3.5 - 5.2 mmol/L 4.6  4.2  4.4   Chloride 96 - 106 mmol/L 104  104  102   CO2 20 - 29 mmol/L 22  27  27    Calcium 8.7 - 10.3 mg/dL 9.3  9.2  9.5   Total Protein 6.0 - 8.3 g/dL  6.4  7.4   Total Bilirubin 0.2 - 1.2 mg/dL  0.7  0.7   Alkaline Phos 39 - 117 U/L  47  52   AST 0 - 37 U/L  22  27   ALT 0 - 35 U/L  25  15     Lab Results  Component Value Date   WBC 8.9 08/24/2022   HGB 14.4 08/24/2022   HCT 42.7 08/24/2022   MCV 97.3 08/24/2022   PLT 516 (H) 08/24/2022   NEUTROABS 7.2 08/24/2022    ASSESSMENT & PLAN:  No problem-specific Assessment & Plan notes found for this encounter.    No orders of  the defined types were placed in this encounter.  The patient has a good understanding of the overall plan. she agrees with it. she will call with any problems that may develop before the next visit here. Total time spent: 30 mins including face to face time and time spent for planning, charting and co-ordination of care   Sherlyn Lick, CMA 03/23/23    I Janan Ridge am acting as a Neurosurgeon for [employer]  ***

## 2023-03-23 NOTE — Progress Notes (Signed)
Cardiology Office Note Date:  03/23/2023  Patient ID:  Destiny Sanchez, Destiny Sanchez 1941/03/13, MRN [id number] PCP:  Chilton Greathouse, MD  Electrophysiologist:  Dr. Johney Frame >> Dr. Lalla Brothers     Chief Complaint:   6 mo  History of Present Illness: Destiny Sanchez is a 82 y.o. female with history of Paroxysmal AFib, IBS, GERD, ectopic ATach, PACs, problem list also mentions vasovagal syncope.  She saw Dr. Johney Frame,  02/15/2019 via tele-health visit.  She was doing well, h/o palpitations felt likely 2/2 PACs and nonsustained ATach, they were controlled, maintained on Xarelto.  Planned for APP visit in 6 months  I saw her Dec 2020 She is doing great.  Very active, golfs regularly, always busy, exercises at the park without difficulty or exertional intolerances.  She recalls once in the last 7mo abut 30seconds of palpitations.  No CP, SOB, DOE.  No dizzy spells, near syncope or syncope No bleeding or signs of bleeding No changes were made  She last saw Dr. Johney Frame June 2021, continued to well, no changes were made, and planned for an annual APP visit.  I saw her June 2022 She continues to do quite well. Remains active, still golfing. She has had 2 episodes of palpitations, lasting a minute or two. They are startling and do make her feel poorly, no CP, no near syncope or syncope She denies any SOB, will get winded with inclines though this is her baseline. No bleeding or signs of bleeding She felt well, minimal burden of arrhythmia by symptoms,  and no changes were made  She saw Dr. Lalla Brothers June 2023, again doing quite well, minimal if any symptoms from her arrhythmia, no changes were made.  Planned for 7mo APP visit and a year with him  She saw Dr. Pamelia Hoit, oncology, 08/21/22 for evaluation of thrombocytosis, workup initiated.   I saw her Jan 2024 She is accompanied by her husband. She is doing well, not golfing as much but staying very active. No CP, SOB, DOE No palpitations, does not think  she has had any arrhythmias No near syncope or syncope. No bleeding or signs of bleeding She sees her PMD No changes made  TODAY She continues to do great Not as much golfing but when they go to [place] she does Has traveled to [place] and [place] since her last visit Walks most days for exercise Has not had any CP, palpitations or cardiac awareness No SOB, DOE No near syncope or syncope. No bleeding or signs of bleeding  Saw hematology last week, no longer needs to follow with him  Past Medical History:  Diagnosis Date   Adenomatous colon polyp 06/2006   Allergy    Anemia    Anxiety    Blood transfusion 1967 &1971   Cancer (HCC)    basal cell on nose   Cataract    bil cataracts removed   Chronic kidney disease    left kidney had a diverticulum - surgery partial nephrectomy   Ectopic atrial tachycardia    nonsustained   GERD (gastroesophageal reflux disease)    Glaucoma    History of migraine    Hx of cardiovascular stress test    a. ETT-Echo (07/2013):  Normal. //  b. ETT-Echo 6/17 - Normal study after maximal exercise   IBS (irritable bowel syndrome)    Osteopenia    Vasovagal syncope 09/30/2017    Past Surgical History:  Procedure Laterality Date   ABDOMINAL HYSTERECTOMY  1975   ABDOMINAL HYSTERECTOMY  APPENDECTOMY     CATARACT EXTRACTION W/ INTRAOCULAR LENS  IMPLANT, BILATERAL     COLONOSCOPY     HEMORROIDECTOMY  1973   MOHS SURGERY     PARTIAL NEPHRECTOMY  1971   diverticulum on left   UPPER GASTROINTESTINAL ENDOSCOPY      Current Outpatient Medications  Medication Sig Dispense Refill   Brinzolamide-Brimonidine (SIMBRINZA) 1-0.2 % SUSP Apply 1 drop to eye 3 (three) times daily.     Calcium Carbonate-Vitamin D (CALTRATE 600+D PO) Take 1,200 mg by mouth daily.       Cholecalciferol (VITAMIN D) 2000 UNITS tablet Take 2,000 Units by mouth daily.       donepezil (ARICEPT) 5 MG tablet Take 5 mg by mouth as needed (for bones).     latanoprost (XALATAN)  0.005 % ophthalmic solution Place 1 drop into both eyes at bedtime.     metoprolol tartrate (LOPRESSOR) 25 MG tablet Take 1 tablet by mouth twice daily 180 tablet 2   Oyster Shell Calcium 500 MG TABS Take 1 tablet by mouth daily.     rivaroxaban (XARELTO) 20 MG TABS tablet TAKE 1 TABLET BY MOUTH EVERY DAY WITH SUPPER 90 tablet 3   Current Facility-Administered Medications  Medication Dose Route Frequency Provider Last Rate Last Admin   0.9 %  sodium chloride infusion  500 mL Intravenous Continuous Meryl Dare, MD        Allergies:   Lactose   Social History:  The patient  reports that she quit smoking about 60 years ago. Her smoking use included cigarettes. She started smoking about 70 years ago. She has a 10 pack-year smoking history. She has never used smokeless tobacco. She reports current alcohol use of about 1.0 standard drink of alcohol per week. She reports that she does not use drugs.   Family History:  The patient's family history includes Colon cancer in her maternal aunt; Throat cancer in her father.  ROS:  Please see the history of present illness.   All other systems are reviewed and otherwise negative.   PHYSICAL EXAM:  VS:  There were no vitals taken for this visit. BMI: There is no height or weight on file to calculate BMI. Well nourished, well developed, in no acute distress  HEENT: normocephalic, atraumatic  Neck: no JVD, carotid bruits or masses Cardiac: RRR; no significant murmurs, no rubs, or gallops Lungs: CTA b/l, no wheezing, rhonchi or rales  Abd: soft, nontender MS: no deformity, thin body habitus, age appropriate atrophy Ext:  no edema  Skin: warm and dry, no rash Neuro:  No gross deficits appreciated Psych: euthymic mood, full affect   EKG:  done today and reviewed by myself SR 74bpm  04/28/22: Coronary Ca++ IMPRESSION: 1. Total calcium score of 0. 2. Clustered solid pulmonary nodules of the left lower lobe, largest measures 3 mm, likely due to  infection or aspiration. No follow-up needed if patient is low-risk (and has no known or suspected primary neoplasm). Non-contrast chest CT can be considered in 12 months if patient is high-risk.  March 2019 Monitor Noral sinus rhythm with average heart rate 80bpm Frequent PACs some with aberration Nonsustained atrial tachycardia up to 24 beats in a row  11/17/2016: TTE Study Conclusions - Left ventricle: The cavity size was normal. Wall thickness was   increased in a pattern of mild LVH. Systolic function was   vigorous. The estimated ejection fraction was in the range of 65%   to 70%. Wall motion was normal;  there were no regional wall   motion abnormalities. Doppler parameters are consistent with   abnormal left ventricular relaxation (grade 1 diastolic   dysfunction). The E/e&' ratio is between 8-15, suggesting   indeterminate LV filling pressure. - Mitral valve: Mildly thickened leaflets . There was mild   regurgitation. - Left atrium: The atrium was normal in size. - Right atrium: The atrium was normal in size. - Tricuspid valve: There was mild regurgitation. - Pulmonary arteries: PA peak pressure: 29 mm Hg (S). - Inferior vena cava: The vessel was normal in size. The   respirophasic diameter changes were in the normal range (= 50%),   consistent with normal central venous pressure.   Impressions: - LVEF 65-70%, mild LVH, normal wall motion, diastolic dysfunction,   indetermiante LV filling pressure, mild MR, normal LA size, mild   TR, RVSP 29 mmHG, normal IVC.   Recent Labs: 08/24/2022: Hemoglobin 14.4; Platelets 516 08/31/2022: BUN 19; Creatinine, Ser 0.46; Potassium 4.6; Sodium 138  No results found for requested labs within last 365 days.   CrCl cannot be calculated (Patient's most recent lab result is older than the maximum 21 days allowed.).   Wt Readings from Last 3 Encounters:  08/31/22 120 lb (54.4 kg)  08/21/22 119 lb 4.8 oz (54.1 kg)  02/16/22 119 lb (54  kg)     Other studies reviewed: Additional studies/records reviewed today include: summarized above  ASSESSMENT AND PLAN:  1. Paroxysmal AFib      and PACs, ATach     CHA2DS2Vasc is 3 (age/gender), on Xarelto, appropriately dosed     Very low/no burden by symptoms  Will update her BMET for her xarelto dosing      Her husband is a long standing patient of Dr. Excell Seltzer, the last time they were to see him, she inquired if he would also see her and reports he was agreeable/happy to I think that is reasonable as well  If she has increasing burden of Afib/EP needs we can always see her back     Disposition: PRN with EP   Current medicines are reviewed at length with the patient today.  The patient did not have any concerns regarding medicines.  Norma Fredrickson, PA-C 03/23/2023 2:14 PM     [facility] [address] [place] [address] [phone] (office)  [phone] (fax)

## 2023-03-24 ENCOUNTER — Inpatient Hospital Stay: Payer: Medicare HMO | Attending: Hematology and Oncology

## 2023-03-24 ENCOUNTER — Other Ambulatory Visit: Payer: Self-pay

## 2023-03-24 ENCOUNTER — Inpatient Hospital Stay: Payer: Medicare HMO | Admitting: Hematology and Oncology

## 2023-03-24 VITALS — BP 126/68 | HR 77 | Temp 97.8°F | Resp 18 | Ht 66.0 in | Wt 117.8 lb

## 2023-03-24 DIAGNOSIS — Z79899 Other long term (current) drug therapy: Secondary | ICD-10-CM | POA: Insufficient documentation

## 2023-03-24 DIAGNOSIS — D75839 Thrombocytosis, unspecified: Secondary | ICD-10-CM | POA: Diagnosis not present

## 2023-03-24 DIAGNOSIS — Z7901 Long term (current) use of anticoagulants: Secondary | ICD-10-CM | POA: Diagnosis not present

## 2023-03-24 LAB — CBC WITH DIFFERENTIAL (CANCER CENTER ONLY)
Abs Immature Granulocytes: 0.03 10*3/uL (ref 0.00–0.07)
Basophils Absolute: 0.1 10*3/uL (ref 0.0–0.1)
Basophils Relative: 1 %
Eosinophils Absolute: 0.2 10*3/uL (ref 0.0–0.5)
Eosinophils Relative: 3 %
HCT: 41.5 % (ref 36.0–46.0)
Hemoglobin: 14 g/dL (ref 12.0–15.0)
Immature Granulocytes: 0 %
Lymphocytes Relative: 15 %
Lymphs Abs: 1 10*3/uL (ref 0.7–4.0)
MCH: 32.9 pg (ref 26.0–34.0)
MCHC: 33.7 g/dL (ref 30.0–36.0)
MCV: 97.4 fL (ref 80.0–100.0)
Monocytes Absolute: 0.5 10*3/uL (ref 0.1–1.0)
Monocytes Relative: 8 %
Neutro Abs: 5.1 10*3/uL (ref 1.7–7.7)
Neutrophils Relative %: 73 %
Platelet Count: 514 10*3/uL — ABNORMAL HIGH (ref 150–400)
RBC: 4.26 MIL/uL (ref 3.87–5.11)
RDW: 12.9 % (ref 11.5–15.5)
WBC Count: 6.9 10*3/uL (ref 4.0–10.5)
nRBC: 0 % (ref 0.0–0.2)

## 2023-03-24 NOTE — Assessment & Plan Note (Signed)
02/10/2016: WBC 4.7, hemoglobin 14.2, platelets 413 04/06/2018: WBC 7.6, hemoglobin 13.4, platelets 474 01/05/2021: WBC 6.42, hemoglobin 14.5, platelets 472 02/10/2022: WBC 7.67, Hemoglobin 15.7, platelets 559 08/24/2022: WBC: 8.9, Hemoglobin 14.4, Platelets: 516, JAK2 Mutation: Positive   Current treatment: Watchful monitoring with rechecking of labs in 6 months and follow-up.

## 2023-03-26 ENCOUNTER — Other Ambulatory Visit: Payer: Self-pay | Admitting: Cardiology

## 2023-03-28 ENCOUNTER — Encounter: Payer: Self-pay | Admitting: Physician Assistant

## 2023-03-28 ENCOUNTER — Ambulatory Visit: Payer: Medicare HMO | Attending: Physician Assistant | Admitting: Physician Assistant

## 2023-03-28 VITALS — BP 108/62 | HR 74 | Ht 66.0 in | Wt 118.0 lb

## 2023-03-28 DIAGNOSIS — I480 Paroxysmal atrial fibrillation: Secondary | ICD-10-CM | POA: Diagnosis not present

## 2023-03-28 MED ORDER — METOPROLOL TARTRATE 25 MG PO TABS: 25.0000 mg | ORAL_TABLET | Freq: Two times a day (BID) | ORAL | 3 refills | Status: DC

## 2023-03-28 MED ORDER — RIVAROXABAN 20 MG PO TABS: ORAL_TABLET | ORAL | 2 refills | Status: DC

## 2023-03-28 NOTE — Patient Instructions (Signed)
Medication Instructions:  Your physician recommends that you continue on your current medications as directed. Please refer to the Current Medication list given to you today.  *If you need a refill on your cardiac medications before your next appointment, please call your pharmacy*   Lab Work: BMET today.  If you have labs (blood work) drawn today and your tests are completely normal, you will receive your results only by: MyChart Message (if you have MyChart) OR A paper copy in the mail If you have any lab test that is abnormal or we need to change your treatment, we will call you to review the results.   Testing/Procedures: None   Follow-Up: At [facility], you and your health needs are our priority.  As part of our continuing mission to provide you with exceptional heart care, we have created designated Provider Care Teams.  These Care Teams include your primary Cardiologist (physician) and Advanced Practice Providers (APPs -  Physician Assistants and Nurse Practitioners) who all work together to provide you with the care you need, when you need it.  We recommend signing up for the patient portal called "MyChart".  Sign up information is provided on this After Visit Summary.  MyChart is used to connect with patients for Virtual Visits (Telemedicine).  Patients are able to view lab/test results, encounter notes, upcoming appointments, etc.  Non-urgent messages can be sent to your provider as well.   To learn more about what you can do with MyChart, go to [email or web address].    Your next appointment:   6 month(s)  Provider:   Tonny Bollman, MD

## 2023-06-14 DIAGNOSIS — F02A0 Dementia in other diseases classified elsewhere, mild, without behavioral disturbance, psychotic disturbance, mood disturbance, and anxiety: Secondary | ICD-10-CM | POA: Diagnosis not present

## 2023-06-14 DIAGNOSIS — D75839 Thrombocytosis, unspecified: Secondary | ICD-10-CM | POA: Diagnosis not present

## 2023-06-14 DIAGNOSIS — I4819 Other persistent atrial fibrillation: Secondary | ICD-10-CM | POA: Diagnosis not present

## 2023-06-14 DIAGNOSIS — G301 Alzheimer's disease with late onset: Secondary | ICD-10-CM | POA: Diagnosis not present

## 2023-07-07 DIAGNOSIS — H40053 Ocular hypertension, bilateral: Secondary | ICD-10-CM | POA: Diagnosis not present

## 2023-07-07 DIAGNOSIS — H268 Other specified cataract: Secondary | ICD-10-CM | POA: Diagnosis not present

## 2023-07-07 DIAGNOSIS — M810 Age-related osteoporosis without current pathological fracture: Secondary | ICD-10-CM | POA: Diagnosis not present

## 2023-09-13 NOTE — Progress Notes (Signed)
  Electrophysiology Office Follow up Visit Note:    Date:  09/14/2023   ID:  Destiny Sanchez, DOB 14-Oct-1940, MRN [id number]  PCP:  Lonzie Robins, MD  [facility] Cardiologist:  None  [facility] Electrophysiologist:  Boyce Byes, MD    Interval History:     Destiny Sanchez is a 83 y.o. female who presents for a follow up visit.   I last saw the patient February 16, 2022 given a history of atrial tachycardia and vasovagal syncope.  The patient was previously followed by Dr. Nunzio Belch.  She takes Xarelto  for her history of paroxysmal atrial fibrillation.  She also takes metoprolol  tartrate twice daily. The patient last saw [facility] March 28, 2023.  She is doing quite well.  She is with her husband today in clinic.  She stays active.  She walks.       Past medical, surgical, social and family history were reviewed.  ROS:   Please see the history of present illness.    All other systems reviewed and are negative.  EKGs/Labs/Other Studies Reviewed:    The following studies were reviewed today:     EKG Interpretation Date/Time:  Wednesday September 14 2023 14:59:14 EST Ventricular Rate:  78 PR Interval:  146 QRS Duration:  70 QT Interval:  386 QTC Calculation: 440 R Axis:   82  Text Interpretation: Normal sinus rhythm Normal ECG Confirmed by Harvie Liner [phone]) on 09/14/2023 3:11:56 PM    Physical Exam:    VS:  BP 126/80 (BP Location: Left Arm, Patient Position: Sitting, Cuff Size: Normal)   Pulse 78   Ht 5\' 6"  (1.676 m)   Wt 118 lb (53.5 kg)   SpO2 98%   BMI 19.05 kg/m     Wt Readings from Last 3 Encounters:  09/14/23 118 lb (53.5 kg)  03/28/23 118 lb (53.5 kg)  03/24/23 117 lb 12.8 oz (53.4 kg)     GEN: no distress. elderly but appears much younger than stated age. CARD: RRR, No MRG RESP: No IWOB. CTAB.      ASSESSMENT:    1. Paroxysmal atrial fibrillation (HCC)    PLAN:    In order of problems listed above:  #Paroxysmal atrial  fibrillation Low burden.  Continue Xarelto  for stroke prophylaxis Continue metoprolol   Follow-up with EP APP in 1 year    Signed, Harvie Liner, MD, [facility], [facility] 09/14/2023 3:12 PM    Electrophysiology [facility]

## 2023-09-14 ENCOUNTER — Encounter: Payer: Self-pay | Admitting: Cardiology

## 2023-09-14 ENCOUNTER — Ambulatory Visit: Payer: Medicare HMO | Attending: Cardiology | Admitting: Cardiology

## 2023-09-14 VITALS — BP 126/80 | HR 78 | Ht 66.0 in | Wt 118.0 lb

## 2023-09-14 DIAGNOSIS — I480 Paroxysmal atrial fibrillation: Secondary | ICD-10-CM

## 2023-09-14 NOTE — Patient Instructions (Signed)
 Medication Instructions:  Your physician recommends that you continue on your current medications as directed. Please refer to the Current Medication list given to you today.  *If you need a refill on your cardiac medications before your next appointment, please call your pharmacy*  Follow-Up: At [facility], you and your health needs are our priority.  As part of our continuing mission to provide you with exceptional heart care, we have created designated Provider Care Teams.  These Care Teams include your primary Cardiologist (physician) and Advanced Practice Providers (APPs -  Physician Assistants and Nurse Practitioners) who all work together to provide you with the care you need, when you need it.  Your next appointment:   1 year  Provider:   You will see one of the following Advanced Practice Providers on your designated Care Team:   Francis Dowse, Charlott Holler [address] [place], [place] Sherie Don, NP Canary Brim, NP

## 2023-11-11 DIAGNOSIS — H40053 Ocular hypertension, bilateral: Secondary | ICD-10-CM | POA: Diagnosis not present

## 2023-11-11 DIAGNOSIS — H268 Other specified cataract: Secondary | ICD-10-CM | POA: Diagnosis not present

## 2023-12-01 DIAGNOSIS — F03A0 Unspecified dementia, mild, without behavioral disturbance, psychotic disturbance, mood disturbance, and anxiety: Secondary | ICD-10-CM | POA: Diagnosis not present

## 2024-01-08 ENCOUNTER — Other Ambulatory Visit: Payer: Self-pay | Admitting: Physician Assistant

## 2024-01-08 DIAGNOSIS — I480 Paroxysmal atrial fibrillation: Secondary | ICD-10-CM

## 2024-01-09 NOTE — Telephone Encounter (Signed)
 Prescription refill request for Xarelto  received.  Indication:afib Last office visit:1/25 Weight:53.5  kg Age:83 Scr:0.51  7/24 CrCl:68.11  ml/min  Prescription refilled

## 2024-01-17 DIAGNOSIS — M810 Age-related osteoporosis without current pathological fracture: Secondary | ICD-10-CM | POA: Diagnosis not present

## 2024-01-17 DIAGNOSIS — M8588 Other specified disorders of bone density and structure, other site: Secondary | ICD-10-CM | POA: Diagnosis not present

## 2024-02-08 DIAGNOSIS — M810 Age-related osteoporosis without current pathological fracture: Secondary | ICD-10-CM | POA: Diagnosis not present

## 2024-02-23 ENCOUNTER — Ambulatory Visit: Payer: Medicare HMO | Attending: Cardiovascular Disease | Admitting: Cardiovascular Disease

## 2024-02-23 ENCOUNTER — Encounter: Payer: Self-pay | Admitting: Cardiovascular Disease

## 2024-02-23 VITALS — BP 140/78 | HR 79 | Ht 66.0 in | Wt 115.2 lb

## 2024-02-23 DIAGNOSIS — I480 Paroxysmal atrial fibrillation: Secondary | ICD-10-CM | POA: Diagnosis not present

## 2024-02-23 NOTE — Progress Notes (Signed)
 Cardiology Office Note:    Date:  02/23/2024   ID:  Destiny Sanchez, DOB 07/24/1941, MRN [id number]  PCP:  Janey Santos, MD   [facility] Providers Cardiologist:  None Electrophysiologist:  OLE ONEIDA HOLTS, MD     Referring MD: Janey Santos, MD   Chief Complaint  Patient presents with   Atrial Fibrillation    History of Present Illness:    Destiny Sanchez is a 83 y.o. female presenting for follow-up of paroxysmal atrial fibrillation, atrial tachycardia, and vasovagal syncope.  She has previously been followed by Dr. Kelsie and Dr. HOLTS.  This is her first visit with me.  I have cared for her husband for many years.  The patient is doing fine from a cardiac perspective.  She denies any heart palpitations, chest pain, or shortness of breath.  She has no edema, lightheadedness, or syncope.  She feels like she is done very well ever since she was placed on medication for atrial fibrillation and she has no symptoms at this time.   Current Medications: Current Meds  Medication Sig   Brinzolamide-Brimonidine (SIMBRINZA) 1-0.2 % SUSP Apply 1 drop to eye 3 (three) times daily.   Calcium Carbonate-Vitamin D  (CALTRATE 600+D PO) Take 1,200 mg by mouth daily.   (Patient taking differently: Take 1,200 mg by mouth as needed.)   Cholecalciferol (VITAMIN D ) 2000 UNITS tablet Take 2,000 Units by mouth daily.     Cyanocobalamin  2500 MCG SUBL Place under the tongue.   donepezil (ARICEPT) 5 MG tablet Take 5 mg by mouth as needed (for bones).   escitalopram (LEXAPRO) 5 MG tablet Take 5 mg by mouth daily.   latanoprost (XALATAN) 0.005 % ophthalmic solution Place 1 drop into both eyes at bedtime.   memantine (NAMENDA) 5 MG tablet Take by mouth. (Patient taking differently: Take 5 mg by mouth daily.)   metoprolol  tartrate (LOPRESSOR ) 25 MG tablet Take 1 tablet (25 mg total) by mouth 2 (two) times daily.   Oyster Shell Calcium 500 MG TABS Take 1 tablet by mouth daily.    rivaroxaban  (XARELTO ) 20 MG TABS tablet TAKE 1 TABLET BY MOUTH ONCE DAILY WITH SUPPER   ROCKLATAN 0.02-0.005 % SOLN    traZODone (DESYREL) 100 MG tablet Take 100 mg by mouth at bedtime. (Patient taking differently: Take 100 mg by mouth as needed.)   Current Facility-Administered Medications for the 02/23/24 encounter (Office Visit) with Wonda Sharper, MD  Medication   0.9 %  sodium chloride  infusion     Allergies:   Lactose and Amoxicillin-pot clavulanate   ROS:   Please see the history of present illness.    All other systems reviewed and are negative.  EKGs/Labs/Other Studies Reviewed:    The following studies were reviewed today: Cardiac Studies & Procedures   ______________________________________________________________________________________________   STRESS TESTS  ECHOCARDIOGRAM STRESS TEST 01/28/2016  Narrative *[facility] [address] [place], [place] [address] [phone]  ------------------------------------------------------------------- Stress Echocardiography  Patient:    Glenna, Brunkow MR #:       [id number] Study Date: 01/28/2016 Gender:     F Age:        53 Height:     167.6 cm Weight:     57.7 kg BSA:        1.64 m^2 Pt. Status: Room:  ATTENDING    Aleene Passe, M.D. TISA Ferrier, Scott T REFERRING    Ferrier, Scott T PERFORMING   [facility], Outpatient SONOGRAPHER  [facility],  RDCS  cc: Dr. Mclean  -------------------------------------------------------------------  ------------------------------------------------------------------- Indications:      Chest Pains (R07.9).  ------------------------------------------------------------------- History:   PMH:  Palpitations, Atrial Fibrillation.  ------------------------------------------------------------------- Study Conclusions  - Stress ECG conclusions: There were no stress arrhythmias or conduction abnormalities. The stress ECG was normal. - Staged echo:  Normal echo stress  Impressions:  - Normal study after maximal exercise.  Bruce protocol. Stress echocardiography.  Birthdate:  Patient birthdate: 1941-03-10.  Age:  Patient is 83 yr old.  Sex:  Gender: female.    BMI: 20.5 kg/m^2.  Blood pressure:     141/85  Patient status:  Outpatient.  Study date:  Study date: 01/28/2016. Study time: 07:35 AM.  -------------------------------------------------------------------  ------------------------------------------------------------------- Baseline ECG:   Normal sinus rhythm.  Isolated atrial ectopy.  ------------------------------------------------------------------- Stress protocol:  +---------------------+---+------------+--------+------------+ Stage                HR BP (mmHg)   SymptomsComments     +---------------------+---+------------+--------+------------+ Baseline             82 141/85 (104)None    ------------ +---------------------+---+------------+--------+------------+ Stage 1              120137/80 (99) -------------------- +---------------------+---+------------+--------+------------+ Stage 2              146164/84 (111)--------RPE= 17.     +---------------------+---+------------+--------+------------+ Stage 3              162--------------------R/T Fatigue. +---------------------+---+------------+--------+------------+ Immediate post stress160-------------------------------- +---------------------+---+------------+--------+------------+ Recovery; 1 min      136187/75 (112)-------------------- +---------------------+---+------------+--------+------------+ Recovery; 2 min      110-------------------------------- +---------------------+---+------------+--------+------------+ Recovery; 3 min      107160/82 (108)-------------------- +---------------------+---+------------+--------+------------+ Recovery; 4 min       103-------------------------------- +---------------------+---+------------+--------+------------+ Recovery; 5 min      102-------------------------------- +---------------------+---+------------+--------+------------+ Late recovery        96 140/83 (102)-------------------- +---------------------+---+------------+--------+------------+  ------------------------------------------------------------------- Stress results:   Maximal heart rate during stress was 162 bpm (113% of maximal predicted heart rate). The maximal predicted heart rate was 143 bpm.The target heart rate was achieved. The heart rate response to stress was normal. There was a normal resting blood pressure with an appropriate response to stress. The rate-pressure product for the peak heart rate and blood pressure was 74567 mm Hg/min.  The patient experienced no chest pain during stress. Functional capacity was normal.  ------------------------------------------------------------------- Stress ECG:  There were no stress arrhythmias or conduction abnormalities.  Isolated ventricular ectopy.  The stress ECG was normal.  ------------------------------------------------------------------- Baseline:  - The estimated LV ejection fraction was 60%. - Normal wall motion; no LV regional wall motion abnormalities.  Peak stress:  - The estimated LV ejection fraction was 75%. - Normal wall motion; no LV regional wall motion abnormalities. - No evidence for new LV regional wall motion abnormalities.  ------------------------------------------------------------------- Stress echo results:     Left ventricular ejection fraction was normal at rest and with stress. Normal echo stress  ------------------------------------------------------------------- Prepared and Electronically Authenticated by  Wilbert Bihari, MD 2017-05-31T22:04:00   ECHOCARDIOGRAM  ECHOCARDIOGRAM COMPLETE 11/17/2016  Narrative *[facility]* [address] [place], [place] [address] [phone]  ------------------------------------------------------------------- Transthoracic Echocardiography  Patient:    Fatema, Rabe MR #:       [id number] Study Date: 11/17/2016 Gender:     F Age:        65 Height:     165.1 cm Weight:     56.7 kg BSA:        1.61 m^2 Pt. Status:  Room:  ORDERING     Wilbert Bihari, MD REFERRING    Wilbert Bihari, MD ATTENDING    Vinie Maxcy MD SONOGRAPHER  [facility], RCS PERFORMING   [facility], Outpatient  cc:  ------------------------------------------------------------------- LV EF: 65% -   70%  ------------------------------------------------------------------- Indications:      Atrial Fibrillation (I48.0).  ------------------------------------------------------------------- History:   PMH:   Dyspnea.  ------------------------------------------------------------------- Study Conclusions  - Left ventricle: The cavity size was normal. Wall thickness was increased in a pattern of mild LVH. Systolic function was vigorous. The estimated ejection fraction was in the range of 65% to 70%. Wall motion was normal; there were no regional wall motion abnormalities. Doppler parameters are consistent with abnormal left ventricular relaxation (grade 1 diastolic dysfunction). The E/e&' ratio is between 8-15, suggesting indeterminate LV filling pressure. - Mitral valve: Mildly thickened leaflets . There was mild regurgitation. - Left atrium: The atrium was normal in size. - Right atrium: The atrium was normal in size. - Tricuspid valve: There was mild regurgitation. - Pulmonary arteries: PA peak pressure: 29 mm Hg (S). - Inferior vena cava: The vessel was normal in size. The respirophasic diameter changes were in the normal range (= 50%), consistent with normal central venous pressure.  Impressions:  - LVEF 65-70%, mild LVH, normal wall motion, diastolic  dysfunction, indetermiante LV filling pressure, mild MR, normal LA size, mild TR, RVSP 29 mmHG, normal IVC.  ------------------------------------------------------------------- Study data:   Study status:  Routine.  Procedure:  The patient reported no pain pre or post test. Transthoracic echocardiography. Image quality was adequate.          Transthoracic echocardiography.  M-mode, complete 2D, spectral Doppler, and color Doppler.  Birthdate:  Patient birthdate: May 05, 1941.  Age:  Patient is 83 yr old.  Sex:  Gender: female.    BMI: 20.8 kg/m^2.  Blood pressure:     136/84  Patient status:  Outpatient.  Study date: Study date: 11/17/2016. Study time: 09:39 AM.  Location:  [facility]  -------------------------------------------------------------------  ------------------------------------------------------------------- Left ventricle:  The cavity size was normal. Wall thickness was increased in a pattern of mild LVH. Systolic function was vigorous. The estimated ejection fraction was in the range of 65% to 70%. Wall motion was normal; there were no regional wall motion abnormalities. Doppler parameters are consistent with abnormal left ventricular relaxation (grade 1 diastolic dysfunction). The E/e&' ratio is between 8-15, suggesting indeterminate LV filling pressure.  ------------------------------------------------------------------- Aortic valve:   Structurally normal valve. Trileaflet. Cusp separation was normal.  Doppler:  Transvalvular velocity was within the normal range. There was no stenosis. There was no regurgitation.  ------------------------------------------------------------------- Aorta:  Aortic root: The aortic root was normal in size. Ascending aorta: The ascending aorta was normal in size.  ------------------------------------------------------------------- Mitral valve:  Mildly thickened leaflets .  Doppler:  There was mild regurgitation.    Peak  gradient (D): 2 mm Hg.  ------------------------------------------------------------------- Left atrium:  The atrium was normal in size.  ------------------------------------------------------------------- Atrial septum:  No defect or patent foramen ovale was identified.  ------------------------------------------------------------------- Right ventricle:  The cavity size was normal. Wall thickness was normal. Systolic function was normal.  ------------------------------------------------------------------- Pulmonic valve:    The valve appears to be grossly normal. Doppler:  There was no significant regurgitation.  ------------------------------------------------------------------- Tricuspid valve:   Doppler:  There was mild regurgitation.  ------------------------------------------------------------------- Pulmonary artery:   The main pulmonary artery was normal-sized.  ------------------------------------------------------------------- Right atrium:  The atrium was normal in size.  ------------------------------------------------------------------- Pericardium:  There was no pericardial effusion.  ------------------------------------------------------------------- Systemic veins: Inferior vena cava: The vessel was normal in size. The respirophasic diameter changes were in the normal range (= 50%), consistent with normal central venous pressure. Diameter: 16 mm.  ------------------------------------------------------------------- Measurements  IVC                                         Value        Reference ID                                          16    mm     ---------  Left ventricle                              Value        Reference LV ID, ED, PLAX chordal             (L)     36.1  mm     43 - 52 LV ID, ES, PLAX chordal             (L)     21.7  mm     23 - 38 LV fx shortening, PLAX chordal              40    %      >=29 LV PW thickness, ED                          11.7  mm     --------- IVS/LV PW ratio, ED                         1.02         <=1.3 Stroke volume, 2D                           88    ml     --------- Stroke volume/bsa, 2D                       55    ml/m^2 --------- LV e&', lateral                              6.69  cm/s   --------- LV E/e&', lateral                            11.21        --------- LV e&', medial                               7.57  cm/s   --------- LV E/e&', medial                             9.91         --------- LV e&', average  7.13  cm/s   --------- LV E/e&', average                            10.52        ---------  Ventricular septum                          Value        Reference IVS thickness, ED                           11.9  mm     ---------  LVOT                                        Value        Reference LVOT ID, S                                  20    mm     --------- LVOT area                                   3.14  cm^2   --------- LVOT peak velocity, S                       99.3  cm/s   --------- LVOT mean velocity, S                       70.2  cm/s   --------- LVOT VTI, S                                 27.9  cm     ---------  Aorta                                       Value        Reference Aortic root ID, ED                          25    mm     ---------  Left atrium                                 Value        Reference LA ID, A-P, ES                              32    mm     --------- LA ID/bsa, A-P                              1.99  cm/m^2 <=2.2 LA volume, S  43    ml     --------- LA volume/bsa, S                            26.7  ml/m^2 --------- LA volume, ES, 1-p A4C                      44    ml     --------- LA volume/bsa, ES, 1-p A4C                  27.3  ml/m^2 --------- LA volume, ES, 1-p A2C                      38    ml     --------- LA volume/bsa, ES, 1-p A2C                  23.6  ml/m^2 ---------  Mitral valve                                 Value        Reference Mitral E-wave peak velocity                 75    cm/s   --------- Mitral A-wave peak velocity                 81.9  cm/s   --------- Mitral deceleration time            (H)     239   ms     150 - 230 Mitral peak gradient, D                     2     mm Hg  --------- Mitral E/A ratio, peak                      0.9          ---------  Pulmonary arteries                          Value        Reference PA pressure, S, DP                          29    mm Hg  <=30  Tricuspid valve                             Value        Reference Tricuspid regurg peak velocity              257   cm/s   --------- Tricuspid peak RV-RA gradient               26    mm Hg  --------- Tricuspid maximal regurg velocity,          257   cm/s   --------- PISA  Systemic veins                              Value        Reference Estimated CVP  3     mm Hg  ---------  Right ventricle                             Value        Reference RV pressure, S, DP                          29    mm Hg  <=30 RV s&', lateral, S                           14.3  cm/s   ---------  Legend: (L)  and  (H)  mark values outside specified reference range.  ------------------------------------------------------------------- Prepared and Electronically Authenticated by  Vinie Maxcy MD 2018-03-21T17:26:33    MONITORS  CARDIAC EVENT MONITOR 10/05/2017  Narrative  Noral sinus rhythm with average heart rate 80bpm  Frequent PACs some with aberration  Nonsustained atrial tachycardia up to 24 beats in a row       ______________________________________________________________________________________________      EKG:        Recent Labs: 03/24/2023: Hemoglobin 14.0; Platelet Count 514 03/28/2023: BUN 15; Creatinine, Ser 0.51; Potassium 5.1; Sodium 141  Recent Lipid Panel    Component Value Date/Time   CHOL 182 04/12/2019 0736   TRIG 119.0  04/12/2019 0736   HDL 52.10 04/12/2019 0736   CHOLHDL 3 04/12/2019 0736   VLDL 23.8 04/12/2019 0736   LDLCALC 106 (H) 04/12/2019 0736   LDLDIRECT 127.0 03/11/2010 0910            Physical Exam:    VS:  BP (!) 140/78   Pulse 79   Ht 5' 6 (1.676 m)   Wt 115 lb 3.2 oz (52.3 kg)   SpO2 96%   BMI 18.59 kg/m     Wt Readings from Last 3 Encounters:  02/23/24 115 lb 3.2 oz (52.3 kg)  09/14/23 118 lb (53.5 kg)  03/28/23 118 lb (53.5 kg)     GEN:  Well nourished, well developed elderly woman in no acute distress HEENT: Normal NECK: No JVD; No carotid bruits LYMPHATICS: No lymphadenopathy CARDIAC: RRR, no murmurs, rubs, gallops RESPIRATORY:  Clear to auscultation without rales, wheezing or rhonchi  ABDOMEN: Soft, non-tender, non-distended MUSCULOSKELETAL:  No edema; No deformity  SKIN: Warm and dry NEUROLOGIC:  Alert and oriented x 3 PSYCHIATRIC:  Normal affect   Assessment & Plan Paroxysmal atrial fibrillation (HCC) Asymptomatic with low atrial fibrillation burden.  Reviewed EP notes.  Anticoagulated with rivaroxaban  and denies bleeding problems.  Tolerating metoprolol  well.  Continue current management.  Studies reviewed from the past and patient has had normal LVEF on previous echo.  She has a coronary calcium score of 0.  She will continue to follow with the EP as scheduled.  I will see her back as needed in the future.            Medication Adjustments/Labs and Tests Ordered: Current medicines are reviewed at length with the patient today.  Concerns regarding medicines are outlined above.  No orders of the defined types were placed in this encounter.  No orders of the defined types were placed in this encounter.   Patient Instructions  Follow-Up: At [facility], you and your health needs are our priority.  As part of our continuing mission to provide you with exceptional heart care, our providers are  all part of one team.  This team includes your  primary Cardiologist (physician) and Advanced Practice Providers or APPs (Physician Assistants and Nurse Practitioners) who all work together to provide you with the care you need, when you need it.  Your next appointment:   AS NEEDED   Provider:   Ozell Fell, MD     Signed, Ozell Fell, MD  02/23/2024 3:30 PM    [facility]

## 2024-02-23 NOTE — Patient Instructions (Signed)
 Follow-Up: At [facility], you and your health needs are our priority.  As part of our continuing mission to provide you with exceptional heart care, our providers are all part of one team.  This team includes your primary Cardiologist (physician) and Advanced Practice Providers or APPs (Physician Assistants and Nurse Practitioners) who all work together to provide you with the care you need, when you need it.  Your next appointment:   AS NEEDED   Provider:   Ozell Fell, MD

## 2024-04-02 DIAGNOSIS — M810 Age-related osteoporosis without current pathological fracture: Secondary | ICD-10-CM | POA: Diagnosis not present

## 2024-04-02 DIAGNOSIS — E538 Deficiency of other specified B group vitamins: Secondary | ICD-10-CM | POA: Diagnosis not present

## 2024-04-02 DIAGNOSIS — Z7901 Long term (current) use of anticoagulants: Secondary | ICD-10-CM | POA: Diagnosis not present

## 2024-04-02 DIAGNOSIS — E785 Hyperlipidemia, unspecified: Secondary | ICD-10-CM | POA: Diagnosis not present

## 2024-04-03 DIAGNOSIS — H40053 Ocular hypertension, bilateral: Secondary | ICD-10-CM | POA: Diagnosis not present

## 2024-04-04 DIAGNOSIS — I4891 Unspecified atrial fibrillation: Secondary | ICD-10-CM | POA: Diagnosis not present

## 2024-04-04 DIAGNOSIS — Z7901 Long term (current) use of anticoagulants: Secondary | ICD-10-CM | POA: Diagnosis not present

## 2024-04-04 DIAGNOSIS — Z1331 Encounter for screening for depression: Secondary | ICD-10-CM | POA: Diagnosis not present

## 2024-04-04 DIAGNOSIS — Z0000 Encounter for general adult medical examination without abnormal findings: Secondary | ICD-10-CM | POA: Diagnosis not present

## 2024-04-04 DIAGNOSIS — R197 Diarrhea, unspecified: Secondary | ICD-10-CM | POA: Diagnosis not present

## 2024-04-04 DIAGNOSIS — H409 Unspecified glaucoma: Secondary | ICD-10-CM | POA: Diagnosis not present

## 2024-04-04 DIAGNOSIS — M810 Age-related osteoporosis without current pathological fracture: Secondary | ICD-10-CM | POA: Diagnosis not present

## 2024-04-04 DIAGNOSIS — D473 Essential (hemorrhagic) thrombocythemia: Secondary | ICD-10-CM | POA: Diagnosis not present

## 2024-04-04 DIAGNOSIS — F03B18 Unspecified dementia, moderate, with other behavioral disturbance: Secondary | ICD-10-CM | POA: Diagnosis not present

## 2024-04-04 DIAGNOSIS — E538 Deficiency of other specified B group vitamins: Secondary | ICD-10-CM | POA: Diagnosis not present

## 2024-04-04 DIAGNOSIS — G4700 Insomnia, unspecified: Secondary | ICD-10-CM | POA: Diagnosis not present

## 2024-04-04 DIAGNOSIS — R252 Cramp and spasm: Secondary | ICD-10-CM | POA: Diagnosis not present

## 2024-04-04 DIAGNOSIS — C44320 Squamous cell carcinoma of skin of unspecified parts of face: Secondary | ICD-10-CM | POA: Diagnosis not present

## 2024-04-04 DIAGNOSIS — Z1339 Encounter for screening examination for other mental health and behavioral disorders: Secondary | ICD-10-CM | POA: Diagnosis not present

## 2024-04-04 DIAGNOSIS — E785 Hyperlipidemia, unspecified: Secondary | ICD-10-CM | POA: Diagnosis not present

## 2024-04-05 ENCOUNTER — Other Ambulatory Visit: Payer: Self-pay | Admitting: Internal Medicine

## 2024-04-05 DIAGNOSIS — H268 Other specified cataract: Secondary | ICD-10-CM | POA: Diagnosis not present

## 2024-04-05 DIAGNOSIS — H40053 Ocular hypertension, bilateral: Secondary | ICD-10-CM | POA: Diagnosis not present

## 2024-04-05 DIAGNOSIS — Z1231 Encounter for screening mammogram for malignant neoplasm of breast: Secondary | ICD-10-CM

## 2024-04-06 ENCOUNTER — Other Ambulatory Visit: Payer: Self-pay | Admitting: Physician Assistant

## 2024-06-05 DIAGNOSIS — F03B18 Unspecified dementia, moderate, with other behavioral disturbance: Secondary | ICD-10-CM | POA: Diagnosis not present

## 2024-06-05 DIAGNOSIS — F02A18 Dementia in other diseases classified elsewhere, mild, with other behavioral disturbance: Secondary | ICD-10-CM | POA: Diagnosis not present

## 2024-06-05 DIAGNOSIS — G301 Alzheimer's disease with late onset: Secondary | ICD-10-CM | POA: Diagnosis not present

## 2024-06-05 DIAGNOSIS — Z1331 Encounter for screening for depression: Secondary | ICD-10-CM | POA: Diagnosis not present

## 2024-08-02 DIAGNOSIS — H6123 Impacted cerumen, bilateral: Secondary | ICD-10-CM | POA: Diagnosis not present

## 2024-08-02 DIAGNOSIS — R051 Acute cough: Secondary | ICD-10-CM | POA: Diagnosis not present

## 2024-08-15 DIAGNOSIS — H6123 Impacted cerumen, bilateral: Secondary | ICD-10-CM | POA: Diagnosis not present

## 2024-10-03 ENCOUNTER — Other Ambulatory Visit: Payer: Self-pay | Admitting: Physician Assistant

## 2024-10-04 ENCOUNTER — Other Ambulatory Visit: Payer: Self-pay | Admitting: Physician Assistant

## 2024-10-04 DIAGNOSIS — I480 Paroxysmal atrial fibrillation: Secondary | ICD-10-CM

## 2024-11-13 ENCOUNTER — Ambulatory Visit: Admitting: Physician Assistant
